# Patient Record
Sex: Male | Born: 1985 | Race: Black or African American | Hispanic: No | Marital: Married | Smoking: Current every day smoker
Health system: Southern US, Community
[De-identification: ages and names within clinical notes are randomized; demographics above are authoritative.]

## PROBLEM LIST (undated history)

## (undated) DIAGNOSIS — R42 Dizziness and giddiness: Secondary | ICD-10-CM

## (undated) HISTORY — PX: TONSILLECTOMY: SUR1361

## (undated) HISTORY — DX: Dizziness and giddiness: R42

---

## 2017-02-18 ENCOUNTER — Encounter: Payer: Self-pay | Admitting: Nurse Practitioner

## 2017-02-18 ENCOUNTER — Ambulatory Visit (INDEPENDENT_AMBULATORY_CARE_PROVIDER_SITE_OTHER): Payer: Medicaid Other | Admitting: Nurse Practitioner

## 2017-02-18 VITALS — BP 105/58 | HR 58 | Temp 98.2°F | Ht 69.5 in | Wt 145.0 lb

## 2017-02-18 DIAGNOSIS — R42 Dizziness and giddiness: Secondary | ICD-10-CM | POA: Diagnosis not present

## 2017-02-18 DIAGNOSIS — E860 Dehydration: Secondary | ICD-10-CM

## 2017-02-18 DIAGNOSIS — H43392 Other vitreous opacities, left eye: Secondary | ICD-10-CM

## 2017-02-18 LAB — CBC WITH DIFFERENTIAL/PLATELET
Basophils Absolute: 0 cells/uL (ref 0–200)
Basophils Relative: 0 %
Eosinophils Absolute: 102 cells/uL (ref 15–500)
Eosinophils Relative: 1 %
HCT: 44.2 % (ref 38.5–50.0)
Hemoglobin: 14.3 g/dL (ref 13.2–17.1)
Lymphocytes Relative: 19 %
Lymphs Abs: 1938 cells/uL (ref 850–3900)
MCH: 30.8 pg (ref 27.0–33.0)
MCHC: 32.4 g/dL (ref 32.0–36.0)
MCV: 95.3 fL (ref 80.0–100.0)
MPV: 10.3 fL (ref 7.5–12.5)
Monocytes Absolute: 306 cells/uL (ref 200–950)
Monocytes Relative: 3 %
Neutro Abs: 7854 cells/uL — ABNORMAL HIGH (ref 1500–7800)
Neutrophils Relative %: 77 %
Platelets: 235 10*3/uL (ref 140–400)
RBC: 4.64 MIL/uL (ref 4.20–5.80)
RDW: 13.4 % (ref 11.0–15.0)
WBC: 10.2 10*3/uL (ref 3.8–10.8)

## 2017-02-18 NOTE — Progress Notes (Signed)
Subjective:    Patient ID: Joshua Michaels., male    DOB: [birth year], 31 y.o.   MRN: [id number]  Joshua Shaffer. is a 31 y.o. male presenting on 02/18/2017 for Dizziness (intermittent dizziness, unstableness x 1 mth. He also complains of a spot in his left eye that moves when he changes the direction)   HPI Having dizziness w/ poor balance and has felt like falling over occurring the last several days intermittently throughout the day.  No dizziness now or earlier today.   Over last month he has had several episodes of 2-3 day periods w/ dizziness that would resolve.  Dizziness starts when up moving around, but is ok when seated.  Occasionally, but less often dizziness occurs when moving from sitting to standing.  Pt notes dizziness is sometimes worse when he smokes cigarettes, but doesn't happen every time he smokes.  Possibly dehydrated.  Has had periods where he ate and felt better, but food does not always help.  Eats one small meal per day, no snacks.  Sometimes may eat two small meals per day. This has been an eating pattern for last several years.  Occasionally will have days where eats all day and cannot get full, but only about 2-3 per month. No food yet today.  Rarely drinks fluids.  Drinks liquids rarely. Works 9 hrs/day 2nd shift.  Sleeps 2-3 hrs per day.  Tossing.  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 Would like to sleep 1a - 9-10am.  Left eye, floaters/flashers  Has a single black dot that moves with him, but has not gone away.  It appeared after flashes about 3 nights ago.  He experienced lots of white flashes like fireworks lasting seconds, but has not had any repeats.   History reviewed. No pertinent past medical history. Past Surgical History:  Procedure Laterality Date  . TONSILLECTOMY     Social History   Social History  . Marital status: Married    Spouse name: N/A  . Number of children: N/A  . Years of education: N/A   Occupational History  .  Not on file.   Social History Main Topics  . Smoking status: Current Every Day Smoker    Packs/day: 0.50    Years: 14.00    Types: Cigarettes  . Smokeless tobacco: Never Used  . Alcohol use No     Comment: rarely  . Drug use: No  . Sexual activity: Not on file   Other Topics Concern  . Not on file   Social History Narrative  . No narrative on file   Family History  Problem Relation Age of Onset  . Lung cancer Maternal Grandfather    No current outpatient prescriptions on file prior to visit.   No current facility-administered medications on file prior to visit.     Review of Systems  Constitutional: Negative.   HENT: Negative.   Eyes: Positive for visual disturbance.  Respiratory: Negative.   Cardiovascular: Negative.   Gastrointestinal: Negative.   Endocrine: Negative.   Genitourinary: Negative.   Musculoskeletal: Negative.   Skin: Negative.   Allergic/Immunologic: Negative.   Neurological: Positive for dizziness and light-headedness.  Hematological: Negative.   Psychiatric/Behavioral: Negative.    Per HPI unless specifically indicated above      Objective:    BP (!) 105/58 (BP Location: Right Arm, Patient Position: Sitting, Cuff Size: Normal)   Pulse (!) 58   Temp 98.2 F (36.8 C) (Oral)   Ht 5' 9.5" (1.765  m)   Wt 145 lb (65.8 kg)   SpO2 99%   BMI 21.11 kg/m   Wt Readings from Last 3 Encounters:  02/18/17 145 lb (65.8 kg)    Physical Exam  Constitutional: He is oriented to person, place, and time. He appears well-developed. No distress.  Thin appearing  HENT:  Head: Normocephalic and atraumatic.  Right Ear: Hearing, external ear and ear canal normal.  Left Ear: Hearing, tympanic membrane, external ear and ear canal normal.  Nose: Nose normal.  Mouth/Throat: Oropharynx is clear and moist.  Eyes: Conjunctivae and EOM are normal. Pupils are equal, round, and reactive to light.  Neck: Normal range of motion. Neck supple. No JVD present. No  tracheal deviation present. No thyromegaly present.  Cardiovascular: Normal rate, regular rhythm, normal heart sounds and intact distal pulses.   Pulmonary/Chest: Effort normal and breath sounds normal.  Musculoskeletal: Normal range of motion. He exhibits no edema.  Lymphadenopathy:    He has no cervical adenopathy.  Neurological: He is alert and oriented to person, place, and time. He has normal reflexes. No cranial nerve deficit. He exhibits normal muscle tone. Coordination normal.  Dix-hallpike positive for eliciting dizziness, negative nystagmus.  Skin: Skin is warm and dry.  Psychiatric: He has a normal mood and affect. His behavior is normal. Judgment and thought content normal.   Results for orders placed or performed in visit on 02/18/17  CBC with Differential/Platelet  Result Value Ref Range   WBC 10.2 3.8 - 10.8 K/uL   RBC 4.64 4.20 - 5.80 MIL/uL   Hemoglobin 14.3 13.2 - 17.1 g/dL   HCT 44.2 38.5 - 50.0 %   MCV 95.3 80.0 - 100.0 fL   MCH 30.8 27.0 - 33.0 pg   MCHC 32.4 32.0 - 36.0 g/dL   RDW 13.4 11.0 - 15.0 %   Platelets 235 140 - 400 K/uL   MPV 10.3 7.5 - 12.5 fL   Neutro Abs 7,854 (H) 1,500 - 7,800 cells/uL   Lymphs Abs 1,938 850 - 3,900 cells/uL   Monocytes Absolute 306 200 - 950 cells/uL   Eosinophils Absolute 102 15 - 500 cells/uL   Basophils Absolute 0 0 - 200 cells/uL   Neutrophils Relative % 77 %   Lymphocytes Relative 19 %   Monocytes Relative 3 %   Eosinophils Relative 1 %   Basophils Relative 0 %   Smear Review Criteria for review not met   Comprehensive metabolic panel  Result Value Ref Range   Sodium 139 135 - 146 mmol/L   Potassium 4.2 3.5 - 5.3 mmol/L   Chloride 106 98 - 110 mmol/L   CO2 25 20 - 31 mmol/L   Glucose, Bld 96 65 - 99 mg/dL   BUN 12 7 - 25 mg/dL   Creat 1.11 0.60 - 1.35 mg/dL   Total Bilirubin 0.3 0.2 - 1.2 mg/dL   Alkaline Phosphatase 99 40 - 115 U/L   AST 15 10 - 40 U/L   ALT 23 9 - 46 U/L   Total Protein 6.5 6.1 - 8.1 g/dL    Albumin 3.9 3.6 - 5.1 g/dL   Calcium 9.0 8.6 - 10.3 mg/dL      Assessment & Plan:   Problem List Items Addressed This Visit    None    Visit Diagnoses    Dizziness    -  Primary Dehydration, mild      Stable course.  Etiology if dizziness likely related to poor hydration and nutrition. Pt  w/ no active dizziness at start of visit.  Dizziness elicited w/ dix-hallpike maneuver.  Subsiding, then worsening after standing.  W/ pt history of poor nutrition and hydration, suspect dehydration related vertigo.  Plan: 1. CBC and CMP evaluate nutritional status, hydration. 2. Encouraged at least 2 meals per day.   3. Drink at least 2 L fluid per day.   4. Follow up 4 weeks or sooner if needed.   Relevant Orders   CBC with Differential/Platelet (Completed)   Comprehensive metabolic panel (Completed)   Floater, vitreous, left     Likely benign.  Possibly related to dehydration.  Discussed non-worsening nature w/ pt. Possible association to dehydration.  May be permanent.  Plan: 1. Encourage hydration. 2. Reassured.  Gave possiblity of resolution, but also stated may be permanent. 3. Consider optometry for second opinion.  Asked pt to monitor for worsening of black spots or floaters especially if they do not move within his field of vision.       No orders of the defined types were placed in this encounter.     Follow up plan: Return in about 4 weeks (around 03/18/2017) for dizziness, smoking cessation.  Cassell Smiles, [name], AGPCNP-BC Adult Gerontology Primary Care Nurse Practitioner [facility] 02/22/2017, 8:08 AM

## 2017-02-18 NOTE — Patient Instructions (Addendum)
Joshua Shaffer, Thank you for coming in to clinic today.  1. Drink 2 L of water each day or more up to 1 gallon.  2. Try to prepare better for sleep w/ a nighttime routine. - NO electronics 30 minutes before you want to sleep. - melatonin - 3-5 mg about 30 minutes before your want to sleep.  3. Eat more regularly. - small snacks or at least 3 small meals per day  Please schedule a follow-up appointment with [name] Meika Earll, [name] to Return in about 4 weeks (around 03/18/2017) for dizziness.  You can come sooner if needed.  If you have any other questions or concerns, please feel free to call the clinic or send a message through MyChart. You may also schedule an earlier appointment if necessary.  [name] Tamilyn Lupien, [name], [name]-BC Adult Gerontology Nurse Practitioner [facility], [facility]   Sleep Hygiene Tips  Take medicines only as directed by your health care provider.  Keep regular sleeping and waking hours. Avoid naps.  Keep a sleep diary to help you and your health care provider figure out what could be causing your insomnia. Include:  When you sleep.  When you wake up during the night.  How well you sleep.  How rested you feel the next day.  Any side effects of medicines you are taking.  What you eat and drink.  Make your bedroom a comfortable place where it is easy to fall asleep:  Put up shades or special blackout curtains to block light from outside.  Use a white noise machine to block noise.  Keep the temperature cool.  Exercise regularly as directed by your health care provider. Avoid exercising right before bedtime.  Use relaxation techniques to manage stress. Ask your health care provider to suggest some techniques that may work well for you. These may include:  Breathing exercises.  Routines to release muscle tension.  Visualizing peaceful scenes.  Cut back on alcohol, caffeinated beverages, and cigarettes, especially close to bedtime. These can  disrupt your sleep.  Do not overeat or eat spicy foods right before bedtime. This can lead to digestive discomfort that can make it hard for you to sleep.  Limit screen use before bedtime. This includes:  Watching TV.  Using your smartphone, tablet, and computer.  Stick to a routine. This can help you fall asleep faster. Try to do a quiet activity, brush your teeth, and go to bed at the same time each night.  Get out of bed if you are still awake after 15 minutes of trying to sleep. Keep the lights down, but try reading or doing a quiet activity. When you feel sleepy, go back to bed.  Make sure that you drive carefully. Avoid driving if you feel very sleepy.  Keep all follow-up appointments as directed by your health care provider. This is important.     Steps to Quit Smoking Smoking tobacco can be bad for your health. It can also affect almost every organ in your body. Smoking puts you and people around you at risk for many serious long-lasting (chronic) diseases. Quitting smoking is hard, but it is one of the best things that you can do for your health. It is never too late to quit. What are the benefits of quitting smoking? When you quit smoking, you lower your risk for getting serious diseases and conditions. They can include:  Lung cancer or lung disease.  Heart disease.  Stroke.  Heart attack.  Not being able to have children (  infertility).  Weak bones (osteoporosis) and broken bones (fractures).  If you have coughing, wheezing, and shortness of breath, those symptoms may get better when you quit. You may also get sick less often. If you are pregnant, quitting smoking can help to lower your chances of having a baby of low birth weight. What can I do to help me quit smoking? Talk with your doctor about what can help you quit smoking. Some things you can do (strategies) include:  Quitting smoking totally, instead of slowly cutting back how much you smoke over a period of  time.  Going to in-person counseling. You are more likely to quit if you go to many counseling sessions.  Using resources and support systems, such as: ? Agricultural engineer with a Veterinary surgeon. ? Phone quitlines. ? Automotive engineer. ? Support groups or group counseling. ? Text messaging programs. ? Mobile phone apps or applications.  Taking medicines. Some of these medicines may have nicotine in them. If you are pregnant or breastfeeding, do not take any medicines to quit smoking unless your doctor says it is okay. Talk with your doctor about counseling or other things that can help you.  Talk with your doctor about using more than one strategy at the same time, such as taking medicines while you are also going to in-person counseling. This can help make quitting easier. What things can I do to make it easier to quit? Quitting smoking might feel very hard at first, but there is a lot that you can do to make it easier. Take these steps:  Talk to your family and friends. Ask them to support and encourage you.  Call phone quitlines, reach out to support groups, or work with a Veterinary surgeon.  Ask people who smoke to not smoke around you.  Avoid places that make you want (trigger) to smoke, such as: ? Bars. ? Parties. ? Smoke-break areas at work.  Spend time with people who do not smoke.  Lower the stress in your life. Stress can make you want to smoke. Try these things to help your stress: ? Getting regular exercise. ? Deep-breathing exercises. ? Yoga. ? Meditating. ? Doing a body scan. To do this, close your eyes, focus on one area of your body at a time from head to toe, and notice which parts of your body are tense. Try to relax the muscles in those areas.  Download or buy apps on your mobile phone or tablet that can help you stick to your quit plan. There are many free apps, such as QuitGuide from the [employer] developer for Disease Control and Prevention). You can find more support from  smokefree.gov and other websites.  1-800 - Quit Now   This information is not intended to replace advice given to you by your health care provider. Make sure you discuss any questions you have with your health care provider. Document Released: 06/20/2009 Document Revised: 04/21/2016 Document Reviewed: 01/08/2015 Elsevier Interactive Patient Education  2018 [employer].

## 2017-02-19 LAB — COMPREHENSIVE METABOLIC PANEL
ALT: 23 U/L (ref 9–46)
AST: 15 U/L (ref 10–40)
Albumin: 3.9 g/dL (ref 3.6–5.1)
Alkaline Phosphatase: 99 U/L (ref 40–115)
BUN: 12 mg/dL (ref 7–25)
CO2: 25 mmol/L (ref 20–31)
Calcium: 9 mg/dL (ref 8.6–10.3)
Chloride: 106 mmol/L (ref 98–110)
Creat: 1.11 mg/dL (ref 0.60–1.35)
Glucose, Bld: 96 mg/dL (ref 65–99)
Potassium: 4.2 mmol/L (ref 3.5–5.3)
Sodium: 139 mmol/L (ref 135–146)
Total Bilirubin: 0.3 mg/dL (ref 0.2–1.2)
Total Protein: 6.5 g/dL (ref 6.1–8.1)

## 2017-02-22 ENCOUNTER — Encounter: Payer: Self-pay | Admitting: Nurse Practitioner

## 2017-02-22 NOTE — Progress Notes (Signed)
I have reviewed this encounter including the documentation in this note and/or discussed this patient with the provider, [name] Kennedy, AGPCNP-BC. I am certifying that I agree with the content of this note as supervising physician.  Saralyn PilarAlexander Itali Mckendry, DO [facility] 02/22/2017, 4:57 PM

## 2017-02-25 ENCOUNTER — Encounter: Payer: Self-pay | Admitting: Nurse Practitioner

## 2017-02-25 ENCOUNTER — Ambulatory Visit (INDEPENDENT_AMBULATORY_CARE_PROVIDER_SITE_OTHER): Payer: Medicaid Other | Admitting: Nurse Practitioner

## 2017-02-25 VITALS — BP 102/64 | HR 65 | Temp 97.7°F | Ht 69.5 in | Wt 140.6 lb

## 2017-02-25 DIAGNOSIS — F488 Other specified nonpsychotic mental disorders: Secondary | ICD-10-CM | POA: Diagnosis not present

## 2017-02-25 DIAGNOSIS — F418 Other specified anxiety disorders: Secondary | ICD-10-CM | POA: Diagnosis not present

## 2017-02-25 DIAGNOSIS — R42 Dizziness and giddiness: Secondary | ICD-10-CM | POA: Diagnosis not present

## 2017-02-25 MED ORDER — MECLIZINE HCL 25 MG PO TABS: 25.0000 mg | ORAL_TABLET | Freq: Three times a day (TID) | ORAL | 0 refills | Status: DC | PRN

## 2017-02-25 MED ORDER — CLONAZEPAM 0.5 MG PO TABS: 0.5000 mg | ORAL_TABLET | Freq: Two times a day (BID) | ORAL | 1 refills | Status: DC | PRN

## 2017-02-25 MED ORDER — ESCITALOPRAM OXALATE 10 MG PO TABS: 10.0000 mg | ORAL_TABLET | Freq: Every day | ORAL | 2 refills | Status: DC

## 2017-02-25 NOTE — Assessment & Plan Note (Signed)
Pt with recurrent symptoms of dizziness.  Possible association to vertigo w/ positive dix-hallpike at last visit.  Dix-hallpike negative today.  Also possible psychogenic symptom.  Plan: 1. Manage anxiety and depression. 2. START meclizine 25 mg tid prn.  Can take 1/2 tablet if drowsiness. 3. Follow up 1 month and as needed.

## 2017-02-25 NOTE — Assessment & Plan Note (Signed)
Uncontrolled.  Pt without prior diagnosis and symptoms x 8 months to > 1 year.  Affecting ability to hold a job and participate in activities w/ family.  Plan: 1. Initiate clonazepam 0.5 mg twice daily as needed for anxiety/panic attack.  Short term usage until SSRI at goal dose. 2. START escitalopram 10 mg once daily. 3. Reviewed SI/HI and plan to carry out.  Cautioned that on medications he may have energy to carry out SI and to notify wife and seek care at ED immediately. 4. Call and schedule psychology counseling.  Provided self-referral resources. 5. Follow up 4 weeks or sooner if needed.

## 2017-02-25 NOTE — Patient Instructions (Addendum)
Joshua Shaffer, Thank you for coming in to clinic today.  1. For your dizziness: - take meclizine one tablet as needed up to three times daily. - If this makes you sleepy, take 1/2 tablet.  2. For your anxiety/ panic attacks: - START clonazepam 0.5 mg as needed up to two times daily. - As we get your dose of escitalopram stable, you will probably need this less frequently.   3. For your anxiety and depression: - START taking escitalopram 10 mg once daily. - If you have nausea, take 1/2 tablet for 1 week. Can also take with food. - It will take 4-6 weeks for this medication to be at full effect.  - START going to a psychologist for counseling only. Psych Counseling ONLY Self Referral: 1. [facility] [place] [facility]   Address: [place], [place], [place] [address] Hours: Open today  9AM-7PM Phone: [phone]  2. [place], [place] Address: [address] [place], [place], [place] [address] Phone: [phone]   [place]   Address: [place], [place], [place] [address] Hours: Tues 4:15PM-8PM // Weds 9am - 12pm // [place] (Closed other days) Phone: [phone]  COUNSELING and psychiatry Self Referral [facility]) [place] [address] [place], [place] [address] Phone: [phone]  [employer], available walk-in 9am-4pm M-F [place],  [id number] Hours: Lup[place] (M-F, walk in available) Phone:(336) [place] (All ages) [address], [place], [id number] Phone: [phone] (Option 1) [email or web address]  Please schedule a follow-up appointment with Cassell Smiles, [name] to Return in about 4 weeks (around 03/25/2017) for anxiety and depression or sooner if needed.  If you have any other questions or concerns, please feel free to call the  clinic or send a message through [place]. You may also schedule an earlier appointment if necessary.  Cassell Smiles, [name], [name]-BC Adult Gerontology Nurse Practitioner [facility], [facility]    Stress and Stress Management Stress is a normal reaction to life events. It is what you feel when life demands more than you are used to or more than you can handle. Some stress can be useful. For example, the stress reaction can help you catch the last bus of the day, study for a test, or meet a deadline at work. But stress that occurs too often or for too long can cause problems. It can affect your emotional health and interfere with relationships and normal daily activities. Too much stress can weaken your immune system and increase your risk for physical illness. If you already have a medical problem, stress can make it worse. What are the causes? All sorts of life events may cause stress. An event that causes stress for one person may not be stressful for another person. Major life events commonly cause stress. These may be positive or negative. Examples include losing your job, moving into a new home, getting married, having a baby, or losing a loved one. Less obvious life events may also cause stress, especially if they occur day after day or in combination. Examples include working long hours, driving in traffic, caring for children, being in debt, or being in a difficult relationship. What are the signs or symptoms? Stress may cause emotional symptoms including, the following:  Anxiety. This is feeling worried, afraid, on edge, overwhelmed, or out  of control.  Anger. This is feeling irritated or impatient.  Depression. This is feeling sad, down, helpless, or guilty.  Difficulty focusing, remembering, or making decisions.  Stress may cause physical symptoms, including the following:  Aches and pains. These may affect your head, neck, back, stomach, or other areas of your  body.  Tight muscles or clenched jaw.  Low energy or trouble sleeping.  Stress may cause unhealthy behaviors, including the following:  Eating to feel better (overeating) or skipping meals.  Sleeping too little, too much, or both.  Working too much or putting off tasks (procrastination).  Smoking, drinking alcohol, or using drugs to feel better.  How is this diagnosed? Stress is diagnosed through an assessment by your health care provider. Your health care provider will ask questions about your symptoms and any stressful life events.Your health care provider will also ask about your medical history and may order blood tests or other tests. Certain medical conditions and medicine can cause physical symptoms similar to stress. Mental illness can cause emotional symptoms and unhealthy behaviors similar to stress. Your health care provider may refer you to a mental health professional for further evaluation. How is this treated? Stress management is the recommended treatment for stress.The goals of stress management are reducing stressful life events and coping with stress in healthy ways. Techniques for reducing stressful life events include the following:  Stress identification. Self-monitor for stress and identify what causes stress for you. These skills may help you to avoid some stressful events.  Time management. Set your priorities, keep a calendar of events, and learn to say "no." These tools can help you avoid making too many commitments.  Techniques for coping with stress include the following:  Rethinking the problem. Try to think realistically about stressful events rather than ignoring them or overreacting. Try to find the positives in a stressful situation rather than focusing on the negatives.  Exercise. Physical exercise can release both physical and emotional tension. The key is to find a form of exercise you enjoy and do it regularly.  Relaxation techniques. These  relax the body and mind. Examples include yoga, meditation, tai chi, biofeedback, deep breathing, progressive muscle relaxation, listening to music, being out in nature, journaling, and other hobbies. Again, the key is to find one or more that you enjoy and can do regularly.  Healthy lifestyle. Eat a balanced diet, get plenty of sleep, and do not smoke. Avoid using alcohol or drugs to relax.  Strong support network. Spend time with family, friends, or other people you enjoy being around.Express your feelings and talk things over with someone you trust.  Counseling or talktherapy with a mental health professional may be helpful if you are having difficulty managing stress on your own. Medicine is typically not recommended for the treatment of stress.Talk to your health care provider if you think you need medicine for symptoms of stress. Follow these instructions at home:  Keep all follow-up visits as directed by your health care provider.  Take all medicines as directed by your health care provider. Contact a health care provider if:  Your symptoms get worse or you start having new symptoms.  You feel overwhelmed by your problems and can no longer manage them on your own. Get help right away if:  You feel like hurting yourself or someone else. This information is not intended to replace advice given to you by your health care provider. Make sure you discuss any questions you have with your health care  provider. Document Released: 02/17/2001 Document Revised: 01/30/2016 Document Reviewed: 04/18/2013 Elsevier Interactive Patient Education  2017 [employer].

## 2017-02-25 NOTE — Progress Notes (Signed)
Subjective:    Patient ID: Joshua Shaffer., male    DOB: [birth year], 31 y.o.   MRN: [id number]  Joshua Shaffer. is a 31 y.o. male presenting on 02/25/2017 for Dizziness (pt have not been able to work )  Pt's wife accompanies him to the appointment today.  HPI Dizziness/Floaters Pt notes resolution of floaters.  He also notes ongoing intermittent dizziness worst w/ smoking.  He has increased water intake, but has not increased food intake r/t poor appetite.  Syncope Has lost his job r/t passing out since our last visit during his first day on the job.  Anxiety and Depression Anhedonia x months to 1 year.  Has trouble sleeping months to 1 year.  Pan-sleep cycle disruption.  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  Decreased appetite x months.  Increased appetite 2-3 days per month.  Possible cause: financially, housing, Not providing for family.  Pt unable to describe what being an adequate provider would look like at this time.  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  Recent symptoms at new job on the first day are: felt hot, dizzy, "overwhelmed," and nervous.  He has periods of time where he also "cant move," has shortness of breath, and occasional tunnel vision.  Sometimes can't drive.  Description of these panic attack episodes occur normally 1-2 times per day up to 3-4 times. Pt's wife admits these symptoms "skyrocketed" 1 month ago b/c moved into "bad neighborhood" and has increased to daily.  Prior, was not daily occurrence.  These anxieties, panic attacks are a affecting pt's ability to hold job.  Endorses SI. Denies plan.  No current thoughts of SI, but last occurred yesterday.  Depression screen PHQ 2/9 02/25/2017  Decreased Interest 3  Down, Depressed, Hopeless 3  PHQ - 2 Score 6  Altered sleeping 1  Tired, decreased energy 2  Change in appetite 3    Feeling bad or failure about yourself  3  Trouble concentrating 2  Moving slowly or fidgety/restless 2  Suicidal thoughts 2  PHQ-9 Score 21   GAD 7 : Generalized Anxiety Score 02/25/2017  Nervous, Anxious, on Edge 3  Control/stop worrying 3  Worry too much - different things 3  Trouble relaxing 2  Restless 2  Easily annoyed or irritable 3  Afraid - awful might happen 3  Total GAD 7 Score 19  Anxiety Difficulty Extremely difficult    [place]-Suicide Severity Rating Scale 1) Have you wished you were dead or wished you could go to sleep and not wake up? - Yes  2) Have you had any actual thoughts of killing yourself? - Yes  3) Have you been thinking about how you might do this? - No  4) Have you had these thoughts and had some intention of acting on them? - No  5) have you started to work out or worked out the details of how to kill yourself? Do you intend to carry out this plan? - No  6) Have you ever done anything, started to do anything, or prepared to do anything to end your life? - No   Social History  Substance Use Topics  . Smoking status: Current Every Day Smoker    Packs/day: 0.50    Years: 14.00    Types: Cigarettes  . Smokeless tobacco: Never Used  . Alcohol use No     Comment:  rarely    Review of Systems Per HPI unless specifically indicated above     Objective:    BP 102/64 (BP Location: Left Arm, Patient Position: Sitting, Cuff Size: Normal)   Pulse 65   Temp 97.7 F (36.5 C) (Oral)   Ht 5' 9.5" (1.765 m)   Wt 140 lb 9.6 oz (63.8 kg)   BMI 20.47 kg/m   Wt Readings from Last 3 Encounters:  02/25/17 140 lb 9.6 oz (63.8 kg)  02/18/17 145 lb (65.8 kg)    Physical Exam  General - healthy, well-appearing, NAD HEENT - Normocephalic, atraumatic, PERRL, EOMI, patent nares w/o congestion, oropharynx clear, MMM Neck - supple, non-tender, no LAD, no thyromegaly Heart - RRR, bradycardia, no murmurs heard Lungs - Clear throughout all lobes, no wheezing,  crackles, or rhonchi. Normal work of breathing. Abdomen - soft, NTND, no masses, no hepatosplenomegaly, active bowel sounds Extremeties - non-tender, no edema, cap refill < 2 seconds, peripheral pulses intact +2 bilaterally Skin - warm, dry, no rashes Neuro - awake, alert, oriented x 3, CN II-X intact, negative dix-hallpike maneuver Psych - Depressed mood and affect, withdrawn behavior, tearful at times during visit.  Normal thought content and judgment.  SI in past week.  No SI/HI today.  No plan for carrying out SI/HI.  Results for orders placed or performed in visit on 02/18/17  CBC with Differential/Platelet  Result Value Ref Range   WBC 10.2 3.8 - 10.8 K/uL   RBC 4.64 4.20 - 5.80 MIL/uL   Hemoglobin 14.3 13.2 - 17.1 g/dL   HCT 44.2 38.5 - 50.0 %   MCV 95.3 80.0 - 100.0 fL   MCH 30.8 27.0 - 33.0 pg   MCHC 32.4 32.0 - 36.0 g/dL   RDW 13.4 11.0 - 15.0 %   Platelets 235 140 - 400 K/uL   MPV 10.3 7.5 - 12.5 fL   Neutro Abs 7,854 (H) 1,500 - 7,800 cells/uL   Lymphs Abs 1,938 850 - 3,900 cells/uL   Monocytes Absolute 306 200 - 950 cells/uL   Eosinophils Absolute 102 15 - 500 cells/uL   Basophils Absolute 0 0 - 200 cells/uL   Neutrophils Relative % 77 %   Lymphocytes Relative 19 %   Monocytes Relative 3 %   Eosinophils Relative 1 %   Basophils Relative 0 %   Smear Review Criteria for review not met   Comprehensive metabolic panel  Result Value Ref Range   Sodium 139 135 - 146 mmol/L   Potassium 4.2 3.5 - 5.3 mmol/L   Chloride 106 98 - 110 mmol/L   CO2 25 20 - 31 mmol/L   Glucose, Bld 96 65 - 99 mg/dL   BUN 12 7 - 25 mg/dL   Creat 1.11 0.60 - 1.35 mg/dL   Total Bilirubin 0.3 0.2 - 1.2 mg/dL   Alkaline Phosphatase 99 40 - 115 U/L   AST 15 10 - 40 U/L   ALT 23 9 - 46 U/L   Total Protein 6.5 6.1 - 8.1 g/dL   Albumin 3.9 3.6 - 5.1 g/dL   Calcium 9.0 8.6 - 10.3 mg/dL      Assessment & Plan:   Problem List Items Addressed This Visit      Other   Anxiety with depression -  Primary    Uncontrolled.  Pt without prior diagnosis and symptoms x 8 months to > 1 year.  Affecting ability to hold a job and participate in activities w/ family.  Plan: 1. Initiate  clonazepam 0.5 mg twice daily as needed for anxiety/panic attack.  Short term usage until SSRI at goal dose. 2. START escitalopram 10 mg once daily. 3. Reviewed SI/HI and plan to carry out.  Cautioned that on medications he may have energy to carry out SI and to notify wife and seek care at ED immediately and sooner f/u w/ me. 4. Call and schedule psychology counseling.  Provided self-referral resources. 5. Follow up 4 weeks or sooner if needed.      Relevant Medications   clonazePAM (KLONOPIN) 0.5 MG tablet   Dizziness    Pt with recurrent symptoms of dizziness.  Possible association to vertigo w/ positive dix-hallpike at last visit.  Dix-hallpike negative today.  Also possible psychogenic symptom.  Plan: 1. Manage anxiety and depression. 2. START meclizine 25 mg tid prn.  Can take 1/2 tablet if drowsiness. 3. Follow up 1 month and as needed.      Relevant Medications   meclizine (ANTIVERT) 25 MG tablet    Other Visit Diagnoses    Psychogenic syncope     Pt w/ syncopal episode at work w/ new job and high anxiety.    Plan: 1. Treat depression as above. 2. Follow up w/ symptoms in 4 weeks.      Meds ordered this encounter  Medications  . meclizine (ANTIVERT) 25 MG tablet    Sig: Take 1 tablet (25 mg total) by mouth 3 (three) times daily as needed for dizziness.    Dispense:  45 tablet    Refill:  0  . escitalopram (LEXAPRO) 10 MG tablet    Sig: Take 1 tablet (10 mg total) by mouth daily.    Dispense:  30 tablet    Refill:  2  . clonazePAM (KLONOPIN) 0.5 MG tablet    Sig: Take 1 tablet (0.5 mg total) by mouth 2 (two) times daily as needed for anxiety.    Dispense:  60 tablet    Refill:  1      Follow up plan: Return in about 4 weeks (around 03/25/2017) for anxiety and depression or  sooner if needed.  Cassell Smiles, [name], AGPCNP-BC Adult Gerontology Primary Care Nurse Practitioner [facility] 02/25/2017, 1:33 PM

## 2017-03-03 NOTE — Progress Notes (Signed)
I have reviewed this encounter including the documentation in this note and/or discussed this patient with the provider, [name] Kennedy, AGPCNP-BC. I am certifying that I agree with the content of this note as supervising physician.  Saralyn PilarAlexander Karamalegos, DO [facility] 03/03/2017, 12:19 PM

## 2017-03-17 ENCOUNTER — Ambulatory Visit (INDEPENDENT_AMBULATORY_CARE_PROVIDER_SITE_OTHER): Payer: Medicaid Other | Admitting: Nurse Practitioner

## 2017-03-17 ENCOUNTER — Encounter: Payer: Self-pay | Admitting: Nurse Practitioner

## 2017-03-17 VITALS — BP 117/72 | HR 49 | Ht 69.0 in | Wt 139.0 lb

## 2017-03-17 DIAGNOSIS — F418 Other specified anxiety disorders: Secondary | ICD-10-CM

## 2017-03-17 DIAGNOSIS — R42 Dizziness and giddiness: Secondary | ICD-10-CM

## 2017-03-17 MED ORDER — BUSPIRONE HCL 5 MG PO TABS: 5.0000 mg | ORAL_TABLET | Freq: Three times a day (TID) | ORAL | 1 refills | Status: DC

## 2017-03-17 MED ORDER — ESCITALOPRAM OXALATE 20 MG PO TABS: 20.0000 mg | ORAL_TABLET | Freq: Every day | ORAL | 2 refills | Status: DC

## 2017-03-17 NOTE — Patient Instructions (Addendum)
Joshua Shaffer, Thank you for coming in to clinic today.  1. For your anxiety and depression: - START buspirone 5 mg once daily at breakfast for 2 days, then increase after 2 days to breakfast and dinner,  Finally increase to breakfast, dinner and bedtime.  - Remember your body scan for mindfulness.    2. Call [facility] today for an appointment in the next 7 days. [facility]) [place] [address] [place], [place] [address] Phone: [phone]  Please schedule a follow-up appointment with [name], [name] to Return in about 1 week (around 03/24/2017) for and 4 weeks anxiety and depression.  If you have been seen [facility] by next Wednesday, you can cancel our 1 week appointment.  If you have any other questions or concerns, please feel free to call the clinic or send a message through MyChart. You may also schedule an earlier appointment if necessary.  [name], [name], [name]-BC Adult Gerontology Nurse Practitioner [facility]    Generalized Anxiety Disorder, Adult Generalized anxiety disorder (GAD) is a mental health disorder. People with this condition constantly worry about everyday events. Unlike normal anxiety, worry related to GAD is not triggered by a specific event. These worries also do not fade or get better with time. GAD interferes with life functions, including relationships, work, and school. GAD can vary from mild to severe. People with severe GAD can have intense waves of anxiety with physical symptoms (panic attacks). What are the causes? The exact cause of GAD is not known. What increases the risk? This condition is more likely to develop in:  Women.  People who have a family history of anxiety disorders.  People who are very shy.  People who experience very stressful life events, such as the death of a loved one.  People who have a very stressful family environment.  What are the signs or symptoms? People with GAD often worry  excessively about many things in their lives, such as their health and family. They may also be overly concerned about:  Doing well at work.  Being on time.  Natural disasters.  Friendships.  Physical symptoms of GAD include:  Fatigue.  Muscle tension or having muscle twitches.  Trembling or feeling shaky.  Being easily startled.  Feeling like your heart is pounding or racing.  Feeling out of breath or like you cannot take a deep breath.  Having trouble falling asleep or staying asleep.  Sweating.  Nausea, diarrhea, or irritable bowel syndrome (IBS).  Headaches.  Trouble concentrating or remembering facts.  Restlessness.  Irritability.  How is this diagnosed? Your health care provider can diagnose GAD based on your symptoms and medical history. You will also have a physical exam. The health care provider will ask specific questions about your symptoms, including how severe they are, when they started, and if they come and go. Your health care provider may ask you about your use of alcohol or drugs, including prescription medicines. Your health care provider may refer you to a mental health specialist for further evaluation. Your health care provider will do a thorough examination and may perform additional tests to rule out other possible causes of your symptoms. To be diagnosed with GAD, a person must have anxiety that:  Is out of his or her control.  Affects several different aspects of his or her life, such as work and relationships.  Causes distress that makes him or her unable to take part in normal activities.  Includes at least  three physical symptoms of GAD, such as restlessness, fatigue, trouble concentrating, irritability, muscle tension, or sleep problems.  Before your health care provider can confirm a diagnosis of GAD, these symptoms must be present more days than they are not, and they must last for six months or longer. How is this treated? The  following therapies are usually used to treat GAD:  Medicine. Antidepressant medicine is usually prescribed for long-term daily control. Antianxiety medicines may be added in severe cases, especially when panic attacks occur.  Talk therapy (psychotherapy). Certain types of talk therapy can be helpful in treating GAD by providing support, education, and guidance. Options include: ? Cognitive behavioral therapy (CBT). People learn coping skills and techniques to ease their anxiety. They learn to identify unrealistic or negative thoughts and behaviors and to replace them with positive ones. ? Acceptance and commitment therapy (ACT). This treatment teaches people how to be mindful as a way to cope with unwanted thoughts and feelings. ? Biofeedback. This process trains you to manage your body's response (physiological response) through breathing techniques and relaxation methods. You will work with a therapist while machines are used to monitor your physical symptoms.  Stress management techniques. These include yoga, meditation, and exercise.  A mental health specialist can help determine which treatment is best for you. Some people see improvement with one type of therapy. However, other people require a combination of therapies. Follow these instructions at home:  Take over-the-counter and prescription medicines only as told by your health care provider.  Try to maintain a normal routine.  Try to anticipate stressful situations and allow extra time to manage them.  Practice any stress management or self-calming techniques as taught by your health care provider.  Do not punish yourself for setbacks or for not making progress.  Try to recognize your accomplishments, even if they are small.  Keep all follow-up visits as told by your health care provider. This is important. Contact a health care provider if:  Your symptoms do not get better.  Your symptoms get worse.  You have signs of  depression, such as: ? A persistently sad, cranky, or irritable mood. ? Loss of enjoyment in activities that used to bring you joy. ? Change in weight or eating. ? Changes in sleeping habits. ? Avoiding friends or family members. ? Loss of energy for normal tasks. ? Feelings of guilt or worthlessness. Get help right away if:  You have serious thoughts about hurting yourself or others. If you ever feel like you may hurt yourself or others, or have thoughts about taking your own life, get help right away. You can go to your nearest emergency department or call:  Your local emergency services (911 in the [place]  A suicide crisis helpline, such as the National Suicide Prevention Lifeline at 601-391-02551-(360)574-7695. This is open 24 hours a day.  Summary  Generalized anxiety disorder (GAD) is a mental health disorder that involves worry that is not triggered by a specific event.  People with GAD often worry excessively about many things in their lives, such as their health and family.  GAD may cause physical symptoms such as restlessness, trouble concentrating, sleep problems, frequent sweating, nausea, diarrhea, headaches, and trembling or muscle twitching.  A mental health specialist can help determine which treatment is best for you. Some people see improvement with one type of therapy. However, other people require a combination of therapies. This information is not intended to replace advice given to you by your health care provider.  Make sure you discuss any questions you have with your health care provider. Document Released: 12/19/2012 Document Revised: 07/14/2016 Document Reviewed: 07/14/2016 Elsevier Interactive Patient Education  [employer].

## 2017-03-17 NOTE — Progress Notes (Signed)
Subjective:    Patient ID: Joshua Michaels., male    DOB: [birth year], 31 y.o.   MRN: [id number]  Joshua Schnitzler. is a 31 y.o. male presenting on 03/17/2017 for Anxiety (pt complains of dizziness, he states he's been of the medication x 3-4 days and symptoms have worsen.)  Pt's wife accompanies him to visit today.  Is good source of information, but her presence is making Adrik withdraw.  Wife asked to leave and willingly complies.  HPI  Anxiety Pt had started taking meds as prescribed and started feeling better after about 1-2 weeks.  Then he stopped escitalopram regularly and taking more clonazepam than prescribed. He ran out of clonazepam then began taking too many excitalopram tablets and using alcohol for self-medication.  W/O being on clonazepam has felt dizzy, frustration, agitation.  He notes feeling severe anxiety/depression symptoms. - denies shakiness  - Has had muscle spasms. - dizziness: Meclizine not helping dizziness- only making him sleepy. -No appetite, no sleep x 2-3 days at a time.  Is able to lay down but "brain won't shut off."  When in a "calm, normal environment I'm fine."   - Has panic attack when in a store - feels more stress in a store. - Also has panic attacks at home while w/ kids and without.   Pt notes a desperation to feel better. Admits SI w/o plan 2-3 times per week, but currently denies SI/HI. Has not called psychiatry as asked at last visit.   Social History  Substance Use Topics  . Smoking status: Current Every Day Smoker    Packs/day: 0.50    Years: 14.00    Types: Cigarettes  . Smokeless tobacco: Never Used  . Alcohol use No     Comment: rarely    Review of Systems Per HPI unless specifically indicated above     Objective:    BP 117/72 (BP Location: Right Arm, Patient Position: Sitting, Cuff Size: Normal)   Pulse (!) 49   Ht 5' 9" (1.753 m)   Wt 139 lb (63 kg)   BMI 20.53 kg/m   Wt Readings from Last 3 Encounters:    03/19/17 139 lb (63 kg)  03/17/17 139 lb (63 kg)  02/25/17 140 lb 9.6 oz (63.8 kg)    Physical Exam  General - thin appearing, well-appearing HEENT - Normocephalic, atraumatic Heart - RRR, no murmurs heard Lungs - Clear throughout all lobes, no wheezing, crackles, or rhonchi. Normal work of breathing. Extremeties - non-tender, no edema, cap refill < 2 seconds, peripheral pulses intact +2 bilaterally Skin - warm, dry, no rashes Neuro - awake, alert, oriented x3, no tremor noted Psych - Anxious, irritable mood and withdrawn affect, self-soothing behavior w/ head in hands, elbows on knees, rocking in chair.  Pt verbally aggressive toward wife when she contributes information about his behavioral habits and medication administration history.  W/ wife absent less withdrawn.  Results for orders placed or performed in visit on 02/18/17  CBC with Differential/Platelet  Result Value Ref Range   WBC 10.2 3.8 - 10.8 K/uL   RBC 4.64 4.20 - 5.80 MIL/uL   Hemoglobin 14.3 13.2 - 17.1 g/dL   HCT 44.2 38.5 - 50.0 %   MCV 95.3 80.0 - 100.0 fL   MCH 30.8 27.0 - 33.0 pg   MCHC 32.4 32.0 - 36.0 g/dL   RDW 13.4 11.0 - 15.0 %   Platelets 235 140 - 400 K/uL   MPV 10.3 7.5 -  12.5 fL   Neutro Abs 7,854 (H) 1,500 - 7,800 cells/uL   Lymphs Abs 1,938 850 - 3,900 cells/uL   Monocytes Absolute 306 200 - 950 cells/uL   Eosinophils Absolute 102 15 - 500 cells/uL   Basophils Absolute 0 0 - 200 cells/uL   Neutrophils Relative % 77 %   Lymphocytes Relative 19 %   Monocytes Relative 3 %   Eosinophils Relative 1 %   Basophils Relative 0 %   Smear Review Criteria for review not met   Comprehensive metabolic panel  Result Value Ref Range   Sodium 139 135 - 146 mmol/L   Potassium 4.2 3.5 - 5.3 mmol/L   Chloride 106 98 - 110 mmol/L   CO2 25 20 - 31 mmol/L   Glucose, Bld 96 65 - 99 mg/dL   BUN 12 7 - 25 mg/dL   Creat 1.11 0.60 - 1.35 mg/dL   Total Bilirubin 0.3 0.2 - 1.2 mg/dL   Alkaline Phosphatase 99 40 -  115 U/L   AST 15 10 - 40 U/L   ALT 23 9 - 46 U/L   Total Protein 6.5 6.1 - 8.1 g/dL   Albumin 3.9 3.6 - 5.1 g/dL   Calcium 9.0 8.6 - 10.3 mg/dL      Assessment & Plan:   Problem List Items Addressed This Visit      Other   Anxiety with depression - Primary    Not coping well with anxiety.  Not taking medications as prescribed and is using alcohol for self-medication.  He has not been able to begin applying for jobs.  Pt has violated our controlled substances contract by taking med more frequently than prescribed and will not be prescribed any more clonazepam.  Plan: 1. Continue escitalopram, but increase to 20 mg once daily since initially responded to med. 2. START buspirone 5 mg once daily, then increase by once dose weekly until at tid dosing.   3. Reviewed serotonin syndrome symptoms w/ pt. Call if experiences any tremor, headache, sweating, diarrhea, increased agitation. 4. Avoid all alcohol. 5. Provided information for patient to call [facility] for scheduling a psychiatry visit and counseling.  Discussed possibility of brief inpatient behavioral health treatment, but pt refuses at this time. No indication for involuntary commitment. 6. Discussed mindfulness strategies of deep breathing and body scan w/ addressing thoughts one by one as initial CBT techniques for panic attacks.  Provided single 2 minute body scan practice session. 7. Follow up w/ phone call this week and in office in one week.      Dizziness    Pt with recurrent symptoms of dizziness.  Dix-hallpike negative today w/ lack of response to meclizine.  Likely psychogenic symptom.  Plan: 1. Manage anxiety and depression. 2. STOP meclizine 25 mg tid prn.  3. Follow up 1 week.         Meds ordered this encounter  Medications  . escitalopram (LEXAPRO) 20 MG tablet    Sig: Take 1 tablet (20 mg total) by mouth daily.    Dispense:  30 tablet    Refill:  2    Order Specific Question:   Supervising Provider    Answer:    Olin Hauser [[id number]]  . busPIRone (BUSPAR) 5 MG tablet    Sig: Take 1 tablet (5 mg total) by mouth 3 (three) times daily.    Dispense:  90 tablet    Refill:  1    Order Specific Question:   Supervising Provider  Answer:   Olin Hauser [[id number]]      Follow up plan: Return in about 1 week (around 03/24/2017) for and 4 weeks anxiety and depression.  A total of 40 minutes was spent face-to-face with this patient. Greater than 50% of this time was spent in counseling and coordination of care with the patient regarding anxiety, stress management techniques, medications, and next treatment options.  Cassell Smiles, [name], AGPCNP-BC Adult Gerontology Primary Care Nurse Practitioner [facility] 03/20/2017, 5:14 PM

## 2017-03-19 ENCOUNTER — Encounter: Payer: Self-pay | Admitting: Emergency Medicine

## 2017-03-19 ENCOUNTER — Emergency Department
Admission: EM | Admit: 2017-03-19 | Discharge: 2017-03-19 | Disposition: A | Discharge status: Home / Self Care | Payer: Medicaid Other | Attending: Emergency Medicine | Admitting: Emergency Medicine

## 2017-03-19 DIAGNOSIS — R42 Dizziness and giddiness: Secondary | ICD-10-CM

## 2017-03-19 DIAGNOSIS — F17210 Nicotine dependence, cigarettes, uncomplicated: Secondary | ICD-10-CM | POA: Diagnosis not present

## 2017-03-19 DIAGNOSIS — F419 Anxiety disorder, unspecified: Secondary | ICD-10-CM

## 2017-03-19 DIAGNOSIS — R109 Unspecified abdominal pain: Secondary | ICD-10-CM | POA: Diagnosis present

## 2017-03-19 LAB — CBC
HEMATOCRIT: 47.4 % (ref 40.0–52.0)
HEMOGLOBIN: 16.2 g/dL (ref 13.0–18.0)
MCH: 30.9 pg (ref 26.0–34.0)
MCHC: 34.2 g/dL (ref 32.0–36.0)
MCV: 90.5 fL (ref 80.0–100.0)
Platelets: 258 10*3/uL (ref 150–440)
RBC: 5.24 MIL/uL (ref 4.40–5.90)
RDW: 13.3 % (ref 11.5–14.5)
WBC: 12.5 10*3/uL — ABNORMAL HIGH (ref 3.8–10.6)

## 2017-03-19 LAB — LIPASE, BLOOD: LIPASE: 27 U/L (ref 11–51)

## 2017-03-19 LAB — COMPREHENSIVE METABOLIC PANEL
ALT: 28 U/L (ref 17–63)
ANION GAP: 7 (ref 5–15)
AST: 24 U/L (ref 15–41)
Albumin: 4.7 g/dL (ref 3.5–5.0)
Alkaline Phosphatase: 89 U/L (ref 38–126)
BUN: 7 mg/dL (ref 6–20)
CHLORIDE: 103 mmol/L (ref 101–111)
CO2: 25 mmol/L (ref 22–32)
Calcium: 9.4 mg/dL (ref 8.9–10.3)
Creatinine, Ser: 1.15 mg/dL (ref 0.61–1.24)
Glucose, Bld: 108 mg/dL — ABNORMAL HIGH (ref 65–99)
POTASSIUM: 3.8 mmol/L (ref 3.5–5.1)
Sodium: 135 mmol/L (ref 135–145)
TOTAL PROTEIN: 8.4 g/dL — AB (ref 6.5–8.1)
Total Bilirubin: 0.6 mg/dL (ref 0.3–1.2)

## 2017-03-19 LAB — URINALYSIS, COMPLETE (UACMP) WITH MICROSCOPIC
BACTERIA UA: NONE SEEN
BILIRUBIN URINE: NEGATIVE
Glucose, UA: NEGATIVE mg/dL
HGB URINE DIPSTICK: NEGATIVE
KETONES UR: NEGATIVE mg/dL
LEUKOCYTES UA: NEGATIVE
NITRITE: NEGATIVE
Protein, ur: NEGATIVE mg/dL
RBC / HPF: NONE SEEN RBC/hpf (ref 0–5)
SQUAMOUS EPITHELIAL / LPF: NONE SEEN
Specific Gravity, Urine: 1.016 (ref 1.005–1.030)
pH: 5 (ref 5.0–8.0)

## 2017-03-19 NOTE — ED Triage Notes (Signed)
Pt with abd pain for over a week.

## 2017-03-19 NOTE — ED Provider Notes (Signed)
[facility] Emergency Department Provider Note  ____________________________________________  Time seen: Approximately 8:00 PM  I have reviewed the triage vital signs and the nursing notes.   HISTORY  Chief Complaint Abdominal Pain   HPI Joshua Shaffer. is a 31 y.o. male with h/o anxiety and panic attacks who presents for evaluation of anxiety.Patient reports that he was prescribed escitalopram by his PCP. He was given one month supply which he took in three weeks. He received a refill from his PCP and has been taking it as prescribed but has been drinking alcohol with it. Patient concerned that he may be withdrawing or have organ failure from taking too much medication and drinking. He denies SI or HI. Triage note says patient has abdominal pain but patient tells me no abdominal pain he just wanted to make sure he did not sustain any organ failure. He has been feeling lightheaded especially after taking escitalopram with alcohol. No lightheadedness at this time. No tremors, no hallucinations, no hypertension, no vomiting or diarrhea. Patient has no chest pain or shortness of breath, no fever or chills, no nausea.  Past Medical History:  Diagnosis Date  . Dizziness     Patient Active Problem List   Diagnosis Date Noted  . Anxiety with depression 02/25/2017  . Dizziness 02/25/2017    Past Surgical History:  Procedure Laterality Date  . TONSILLECTOMY      Prior to Admission medications   Medication Sig Start Date End Date Taking? Authorizing Provider  busPIRone (BUSPAR) 5 MG tablet Take 1 tablet (5 mg total) by mouth 3 (three) times daily. 03/17/17   Galen Manila, NP  escitalopram (LEXAPRO) 20 MG tablet Take 1 tablet (20 mg total) by mouth daily. 03/17/17   Galen Manila, NP    Allergies Patient has no known allergies.  Family History  Problem Relation Age of Onset  . Lung cancer Maternal Grandfather     Social History Social  History  Substance Use Topics  . Smoking status: Current Every Day Smoker    Packs/day: 0.50    Years: 14.00    Types: Cigarettes  . Smokeless tobacco: Never Used  . Alcohol use No     Comment: rarely    Review of Systems  Constitutional: Negative for fever. + Dizziness Eyes: Negative for visual changes. ENT: Negative for sore throat. Neck: No neck pain  Cardiovascular: Negative for chest pain. Respiratory: Negative for shortness of breath. Gastrointestinal: Negative for abdominal pain, vomiting or diarrhea. Genitourinary: Negative for dysuria. Musculoskeletal: Negative for back pain. Skin: Negative for rash. Neurological: Negative for headaches, weakness or numbness. Psych: No SI or HI. + anxiety  ____________________________________________   PHYSICAL EXAM:  VITAL SIGNS: ED Triage Vitals  Enc Vitals Group     BP 03/19/17 1705 127/89     Pulse Rate 03/19/17 1705 74     Resp 03/19/17 1705 20     Temp 03/19/17 1705 99.2 F (37.3 C)     Temp Source 03/19/17 1705 Oral     SpO2 03/19/17 1705 98 %     Weight 03/19/17 1705 139 lb (63 kg)     Height --      Head Circumference --      Peak Flow --      Pain Score 03/19/17 1704 2     Pain Loc --      Pain Edu? --      Excl. in GC? --     Constitutional:  Alert and oriented. Well appearing and in no apparent distress. HEENT:      Head: Normocephalic and atraumatic.         Eyes: Conjunctivae are normal. Sclera is non-icteric.       Mouth/Throat: Mucous membranes are moist.       Neck: Supple with no signs of meningismus. Cardiovascular: Regular rate and rhythm. No murmurs, gallops, or rubs. 2+ symmetrical distal pulses are present in all extremities. No JVD. Respiratory: Normal respiratory effort. Lungs are clear to auscultation bilaterally. No wheezes, crackles, or rhonchi.  Gastrointestinal: Soft, non tender, and non distended with positive bowel sounds. No rebound or guarding. Genitourinary: No CVA  tenderness. Musculoskeletal: Nontender with normal range of motion in all extremities. No edema, cyanosis, or erythema of extremities. Neurologic: Normal speech and language. Face is symmetric. Moving all extremities. No gross focal neurologic deficits are appreciated. Skin: Skin is warm, dry and intact. No rash noted. Psychiatric: Mood and affect are normal. Speech and behavior are normal.  ____________________________________________   LABS (all labs ordered are listed, but only abnormal results are displayed)  Labs Reviewed  COMPREHENSIVE METABOLIC PANEL - Abnormal; Notable for the following:       Result Value   Glucose, Bld 108 (*)    Total Protein 8.4 (*)    All other components within normal limits  CBC - Abnormal; Notable for the following:    WBC 12.5 (*)    All other components within normal limits  URINALYSIS, COMPLETE (UACMP) WITH MICROSCOPIC - Abnormal; Notable for the following:    Color, Urine YELLOW (*)    APPearance CLEAR (*)    All other components within normal limits  LIPASE, BLOOD   ____________________________________________  EKG  none  ____________________________________________  RADIOLOGY  none  ____________________________________________   PROCEDURES  Procedure(s) performed: None Procedures Critical Care performed:  None ____________________________________________   INITIAL IMPRESSION / ASSESSMENT AND PLAN / ED COURSE   31 y.o. male with h/o anxiety and panic attacks who presents for evaluation of anxiety and concerns of organ failure after taking one month supply of escitalopram over the course of 3 weeks with alcohol. Patient is well-appearing with no signs or symptoms of withdrawal, has normal vital signs, abdomen is soft with no tenderness throughout. CBC shows slight leukocytosis, CMP and lipase are within normal limits. UA with no evidence of dehydration or infection. Had a long discussion with patient about the danger of drinking  alcohol and taking psychiatric medications with then including respiratory depression and death. Recommended that patient does not drink alcohol while on these medications. Recommend close follow-up with patient's primary care doctor. Patient was encouraged and happy to find out that his liver enzymes are within normal limits. Patient denies any coingestions such as Tylenol or salicylates. Patient clear for dc     Pertinent labs & imaging results that were available during my care of the patient were reviewed by me and considered in my medical decision making (see chart for details).    ____________________________________________   FINAL CLINICAL IMPRESSION(S) / ED DIAGNOSES  Final diagnoses:  Dizziness  Anxiety      NEW MEDICATIONS STARTED DURING THIS VISIT:  New Prescriptions   No medications on file     Note:  This document was prepared using Dragon voice recognition software and may include unintentional dictation errors.    Nita SickleVeronese, [place], MD 03/19/17 2008

## 2017-03-20 NOTE — Assessment & Plan Note (Signed)
Pt with recurrent symptoms of dizziness.  Dix-hallpike negative today w/ lack of response to meclizine.  Likely psychogenic symptom.  Plan: 1. Manage anxiety and depression. 2. STOP meclizine 25 mg tid prn.  3. Follow up 1 week.

## 2017-03-20 NOTE — Assessment & Plan Note (Addendum)
Not coping well with anxiety.  Not taking medications as prescribed and is using alcohol for self-medication.  He has not been able to begin applying for jobs.  Pt has violated our controlled substances contract by taking med more frequently than prescribed and will not be prescribed any more clonazepam.  Plan: 1. Continue escitalopram, but increase to 20 mg once daily since initially responded to med. 2. START buspirone 5 mg once daily, then increase by once dose weekly until at tid dosing.   3. Reviewed serotonin syndrome symptoms w/ pt. Call if experiences any tremor, headache, sweating, diarrhea, increased agitation. 4. Avoid all alcohol. 5. Provided information for patient to call [facility] for scheduling a psychiatry visit and counseling.  Discussed possibility of brief inpatient behavioral health treatment, but pt refuses at this time. No indication for involuntary commitment. 6. Discussed mindfulness strategies of deep breathing and body scan w/ addressing thoughts one by one as initial CBT techniques for panic attacks.  Provided single 2 minute body scan practice session. 7. Follow up w/ phone call this week and in office in one week.

## 2017-03-20 NOTE — Progress Notes (Signed)
I have reviewed this encounter including the documentation in this note and/or discussed this patient with the provider, [name] Kennedy, AGPCNP-BC. I am certifying that I agree with the content of this note as supervising physician.  Saralyn PilarAlexander Kaulin Chaves, DO [facility] 03/20/2017, 8:22 PM

## 2017-03-25 ENCOUNTER — Ambulatory Visit (INDEPENDENT_AMBULATORY_CARE_PROVIDER_SITE_OTHER): Payer: Medicaid Other | Admitting: Nurse Practitioner

## 2017-03-25 ENCOUNTER — Encounter: Payer: Self-pay | Admitting: Nurse Practitioner

## 2017-03-25 VITALS — BP 110/72 | HR 66 | Temp 98.3°F | Ht 69.0 in | Wt 138.8 lb

## 2017-03-25 DIAGNOSIS — F418 Other specified anxiety disorders: Secondary | ICD-10-CM | POA: Diagnosis not present

## 2017-03-25 DIAGNOSIS — F5104 Psychophysiologic insomnia: Secondary | ICD-10-CM

## 2017-03-25 DIAGNOSIS — R42 Dizziness and giddiness: Secondary | ICD-10-CM

## 2017-03-25 NOTE — Patient Instructions (Addendum)
Joshua Shaffer, Thank you for coming in to clinic today.  1. For your anxiety:  Call today [facility] or [facility] for an appointment in the next 1-2 weeks for psychiatry and counseling.  [facility])  [address], [place], [place] [address] Phone: [phone]  [facility], available walk-in 9am-4pm M-F [address] [place], [place] [address] Hours: 9am - 4pm (M-F, walk in available) [phone]) [phone]  Stay active w/ phone, games, or other activity to quiet your thoughts.  Medically, there is nothing wrong with your organs or labs.  Sudden death is very unlikely.  2. Neurology referral has been placed for more evaluation of your dizziness. It could still be related to your moods, but this will help us be sure.   Please schedule a follow-up appointment with [name] [name], [name] to Return in about 2 weeks (around 04/08/2017) for anxiety. If you have started seeing [facility] or [facility], we can move this appointment if we need to.  If you have any other questions or concerns, please feel free to call the clinic or send a message through MyChart. You may also schedule an earlier appointment if necessary.  [name] [name], [name], [name]-BC Adult Gerontology Nurse Practitioner [facility], [facility]

## 2017-03-25 NOTE — Progress Notes (Signed)
Subjective:    Patient ID: Joshua Blissavid A Vertz Jr., male    DOB: [birth year], 31 y.o.   MRN: [id number]  Joshua BlissDavid A Nevils Jr. is a 31 y.o. male presenting on 03/25/2017 for Anxiety (depression)   HPI  Anxiety and Depression Pt has been very gradually improving: - improved appetite- Eating 2 x per day, drinking well. - sleeping some each night (although not good quality or more than 2-4 hours.  Less daytime drowsiness. - Less disabling panic  He is taking lexapro 20 mg once daily and buspirone 5 mg tid, tolerating well without side effects other than decreased libido and decreased penile sensation w/ erection.  Denies signs of serotonin syndrome: headaches, tremor, nausea. - Felt more in control this week than last week, but thoughts are still running.  Has not used body scan tool as given at last visit for dealing with racing thoughts or anxiety. - Pt having stress for getting to appointments on time.  Feels scared. - Still feeling anhedonia and generalized irritation.   - Feels that he is going to die and is dying every day.  He went to ED w/ same complaints and has had negative workup w/ normal labs.  Feeling of impending doom related to dizziness and normal physiologic activities of gas/intestinal movement.     Dizziness Persistent dizziness (sitting, lying, standing, w/ activity and w/ anxiety).  Pt states he tried to take out the trash and got dizzy.  Also has heat flushing occasionally w/ dizziness.  Insomnia Pt states he "can't sleep" because he can't make thoughts stop racing.  He is taking melatonin 10 mg - makes him drowsy and then he wakes up soon and has racing thoughts.     Social History  Substance Use Topics  . Smoking status: Current Every Day Smoker    Packs/day: 0.50    Years: 14.00    Types: Cigarettes  . Smokeless tobacco: Never Used  . Alcohol use No     Comment: rarely    Review of Systems Per HPI unless specifically indicated above     Objective:    BP  110/72 (BP Location: Right Arm, Patient Position: Sitting, Cuff Size: Normal)   Pulse 66   Temp 98.3 F (36.8 C) (Oral)   Ht 5\' 9"  (1.753 m)   Wt 138 lb 12.8 oz (63 kg)   BMI 20.50 kg/m   Orthostatic BP: Lying: 105/68 Sitting:110/72 Standing: 110/76  Wt Readings from Last 3 Encounters:  03/25/17 138 lb 12.8 oz (63 kg)  03/19/17 139 lb (63 kg)  03/17/17 139 lb (63 kg)    Physical Exam  General - thin, well-appearing, NAD HEENT - Normocephalic, atraumatic, PERRL, EOMI, patent nares w/o congestion, oropharynx clear, MMM Heart - RRR, no murmurs heard Lungs - Clear throughout all lobes, no wheezing, crackles, or rhonchi. Normal work of breathing. Abdomen - soft, NTND, no masses, no hepatosplenomegaly, active bowel sounds Extremeties - non-tender, no edema, cap refill < 2 seconds, peripheral pulses intact +2 bilaterally Skin - warm, dry, no rashes Neuro - awake, alert, oriented x3, CN II-X intact, intact muscle strength 5/5 bilaterally, intact distal sensation to light touch, normal coordination, normal gait, negative dix-hallpike and forward head tilt.  DTR +2 throughout.  No presence of myoclonus w/ dorsiflexion. Psych - Anxious mood and affect.  Abnormal behavior pt unable to make eye contact, fidgets in chair.  Difficult to obtain history (pt does not want to share information willingly).  Results for orders placed or  performed during the hospital encounter of 03/19/17  Lipase, blood  Result Value Ref Range   Lipase 27 11 - 51 U/L  Comprehensive metabolic panel  Result Value Ref Range   Sodium 135 135 - 145 mmol/L   Potassium 3.8 3.5 - 5.1 mmol/L   Chloride 103 101 - 111 mmol/L   CO2 25 22 - 32 mmol/L   Glucose, Bld 108 (H) 65 - 99 mg/dL   BUN 7 6 - 20 mg/dL   Creatinine, Ser 6.29 0.61 - 1.24 mg/dL   Calcium 9.4 8.9 - 52.8 mg/dL   Total Protein 8.4 (H) 6.5 - 8.1 g/dL   Albumin 4.7 3.5 - 5.0 g/dL   AST 24 15 - 41 U/L   ALT 28 17 - 63 U/L   Alkaline Phosphatase 89 38 - 126  U/L   Total Bilirubin 0.6 0.3 - 1.2 mg/dL   GFR calc non Af Amer >60 >60 mL/min   GFR calc Af Amer >60 >60 mL/min   Anion gap 7 5 - 15  CBC  Result Value Ref Range   WBC 12.5 (H) 3.8 - 10.6 K/uL   RBC 5.24 4.40 - 5.90 MIL/uL   Hemoglobin 16.2 13.0 - 18.0 g/dL   HCT 41.3 24.4 - 01.0 %   MCV 90.5 80.0 - 100.0 fL   MCH 30.9 26.0 - 34.0 pg   MCHC 34.2 32.0 - 36.0 g/dL   RDW 27.2 53.6 - 64.4 %   Platelets 258 150 - 440 K/uL  Urinalysis, Complete w Microscopic  Result Value Ref Range   Color, Urine YELLOW (A) YELLOW   APPearance CLEAR (A) CLEAR   Specific Gravity, Urine 1.016 1.005 - 1.030   pH 5.0 5.0 - 8.0   Glucose, UA NEGATIVE NEGATIVE mg/dL   Hgb urine dipstick NEGATIVE NEGATIVE   Bilirubin Urine NEGATIVE NEGATIVE   Ketones, ur NEGATIVE NEGATIVE mg/dL   Protein, ur NEGATIVE NEGATIVE mg/dL   Nitrite NEGATIVE NEGATIVE   Leukocytes, UA NEGATIVE NEGATIVE   RBC / HPF NONE SEEN 0 - 5 RBC/hpf   WBC, UA 0-5 0 - 5 WBC/hpf   Bacteria, UA NONE SEEN NONE SEEN   Squamous Epithelial / LPF NONE SEEN NONE SEEN   Mucous PRESENT       Assessment & Plan:   Problem List Items Addressed This Visit      Other   Anxiety with depression    Uncontrolled, not coping well with anxiety.  Pt is now taking medications as prescribed.  Small improvements noted in last 1 week for appetite and reduced panic on buspirone 5 mg tid and w/ increased lexapro 20 mg once daily.  Plan: 1. Continue escitalopram 20 mg once daily. 2. Continue buspirone 5 mg tid  3. Reviewed body scan and non-pharm stress management techniques. 4. Pt requested again to call [facility] for scheduling a psychiatry visit and counseling. 5. Follow up in clinic in 2 weeks.      Dizziness - Primary    Pt with recurrent symptoms of dizziness.  Dix-hallpike negative today.  Likely psychogenic symptom, but patient concerned for possibility of incomplete workup.  Plan: 1. Manage anxiety and depression. 2. Proceed w/ neurological  referral. 3. Follow up as needed for worsening symptoms.      Relevant Orders   Ambulatory referral to Neurology   Insomnia    Onset of sleep improved w/ melatonin use of 10 mg nightly.  Pt w/ persistent problem maintaining sleep and returning to sleep once awakened  early.    Plan: 1. Consider mirtazapine in future if persistence w/ insomnia.  Would stop buspirone if using mirtazapine. 2. Encouraged good sleep hygiene. 3. Ok to continue using melatonin 10 mg once at bedtime. 4. Follow up 2 weeks.           Follow up plan: Return in about 2 weeks (around 04/08/2017) for anxiety.   [name], [name], AGPCNP-BC Adult Gerontology Primary Care Nurse Practitioner [facility] 03/27/2017, 11:17 PM

## 2017-03-27 DIAGNOSIS — G4700 Insomnia, unspecified: Secondary | ICD-10-CM | POA: Insufficient documentation

## 2017-03-27 NOTE — Assessment & Plan Note (Signed)
Pt with recurrent symptoms of dizziness.  Dix-hallpike negative today.  Likely psychogenic symptom, but patient concerned for possibility of incomplete workup.  Plan: 1. Manage anxiety and depression. 2. Proceed w/ neurological referral. 3. Follow up as needed for worsening symptoms.

## 2017-03-27 NOTE — Assessment & Plan Note (Signed)
Onset of sleep improved w/ melatonin use of 10 mg nightly.  Pt w/ persistent problem maintaining sleep and returning to sleep once awakened early.    Plan: 1. Consider mirtazapine in future if persistence w/ insomnia.  Would stop buspirone if using mirtazapine. 2. Encouraged good sleep hygiene. 3. Ok to continue using melatonin 10 mg once at bedtime. 4. Follow up 2 weeks.

## 2017-03-27 NOTE — Assessment & Plan Note (Signed)
Uncontrolled, not coping well with anxiety.  Pt is now taking medications as prescribed.  Small improvements noted in last 1 week for appetite and reduced panic on buspirone 5 mg tid and w/ increased lexapro 20 mg once daily.  Plan: 1. Continue escitalopram 20 mg once daily. 2. Continue buspirone 5 mg tid  3. Reviewed body scan and non-pharm stress management techniques. 4. Pt requested again to call [facility] for scheduling a psychiatry visit and counseling. 5. Follow up in clinic in 2 weeks.

## 2017-03-29 NOTE — Progress Notes (Signed)
I have reviewed this encounter including the documentation in this note and/or discussed this patient with the provider, [name] Kennedy, AGPCNP-BC. I am certifying that I agree with the content of this note as supervising physician.  Saralyn PilarAlexander Aleisha Paone, DO [facility] 03/29/2017, 12:16 PM

## 2017-04-07 ENCOUNTER — Ambulatory Visit: Payer: Medicaid Other | Admitting: Nurse Practitioner

## 2017-05-17 ENCOUNTER — Ambulatory Visit (INDEPENDENT_AMBULATORY_CARE_PROVIDER_SITE_OTHER): Payer: Self-pay | Admitting: Nurse Practitioner

## 2017-05-17 VITALS — BP 99/59 | HR 61 | Temp 97.8°F | Ht 69.0 in | Wt 153.8 lb

## 2017-05-17 DIAGNOSIS — F5104 Psychophysiologic insomnia: Secondary | ICD-10-CM

## 2017-05-17 DIAGNOSIS — F418 Other specified anxiety disorders: Secondary | ICD-10-CM

## 2017-05-17 MED ORDER — ESCITALOPRAM OXALATE 20 MG PO TABS: 20.0000 mg | ORAL_TABLET | Freq: Every day | ORAL | 5 refills | Status: DC

## 2017-05-17 MED ORDER — BUSPIRONE HCL 5 MG PO TABS: 5.0000 mg | ORAL_TABLET | Freq: Three times a day (TID) | ORAL | 5 refills | Status: DC

## 2017-05-17 NOTE — Patient Instructions (Addendum)
Joshua Shaffer, Thank you for coming in to clinic today.  1. For your anxiety:  GREAT WORK! - Let me know if anything changes and you need to see me sooner.  2. For your sleep: - START taking melatonin 5 mg one tablet at bedtime for the next 2-4 weeks.   - Once your sleep schedule is reset, take 1/2 tablet for 7 days then STOP.  - Sleep Hygiene Tips  Take medicines only as directed by your health care provider.  Keep regular sleeping and waking hours. Avoid naps.  Keep a sleep diary to help you and your health care provider figure out what could be causing your insomnia. Include:  When you sleep.  When you wake up during the night.  How well you sleep.  How rested you feel the next day.  Any side effects of medicines you are taking.  What you eat and drink.  Make your bedroom a comfortable place where it is easy to fall asleep:  Put up shades or special blackout curtains to block light from outside.  Use a white noise machine to block noise.  Keep the temperature cool.  Exercise regularly as directed by your health care provider. Avoid exercising right before bedtime.  Use relaxation techniques to manage stress. Ask your health care provider to suggest some techniques that may work well for you. These may include:  Breathing exercises.  Routines to release muscle tension.  Visualizing peaceful scenes.  Cut back on alcohol, caffeinated beverages, and cigarettes, especially close to bedtime. These can disrupt your sleep.  Do not overeat or eat spicy foods right before bedtime. This can lead to digestive discomfort that can make it hard for you to sleep.  Limit screen use before bedtime. This includes:  Watching TV.  Using your smartphone, tablet, and computer.  Stick to a routine. This can help you fall asleep faster. Try to do a quiet activity, brush your teeth, and go to bed at the same time each night.  Get out of bed if you are still awake after 15 minutes of  trying to sleep. Keep the lights down, but try reading or doing a quiet activity. When you feel sleepy, go back to bed.  Make sure that you drive carefully. Avoid driving if you feel very sleepy.  Keep all follow-up appointments as directed by your health care provider. This is important.   Please schedule a follow-up appointment with [name] Serigne Kubicek, [name]. Return in about 6 months (around 11/14/2017) for anxiety.  If you have any other questions or concerns, please feel free to call the clinic or send a message through MyChart. You may also schedule an earlier appointment if necessary.  You will receive a survey after today's visit either digitally by e-mail or paper by [employer] mail. Your experiences and feedback matter to us.  Please respond so we know how we are doing as we provide care for you.   [name] Rateel Beldin, [name], [name]-BC Adult Gerontology Nurse Practitioner [facility], [facility]

## 2017-05-17 NOTE — Progress Notes (Signed)
Subjective:    Patient ID: Joshua Bliss., male    DOB: [birth year], 31 y.o.   MRN: [id number]  Joshua Rison. is a 31 y.o. male presenting on 05/17/2017 for Panic Attack (depression. Pt took his last medication on yesterday.)   HPI   Anxiety with Depression and Panic  Pt states he is feeling much better.  Now significantly less anhedonia.  Is able to care for children well again.  Wife is much happier w/ his ability to care for himself and others.  He now cleans house when awake at night.   - Last panic attack was more than 3 weeks ago.   - Feels like he could work again. - Now is no longer dizzy and this is his biggest relief. - Is eating better.  Appetite is significantly improved and has gained weight. - Denies all SI in last 3 weeks.  - Worst ongoing complaint is insomnia.  Pt unable to sleep at night, but is getting uninterrupted sleep during day.  Using TV, electronic devices at night.  States he has tried melatonin in past but it didn't help.  Depression screen [facility] 2/9 05/17/2017 03/25/2017 03/25/2017 03/25/2017 02/25/2017  Decreased Interest 0 Down, Depressed, Hopeless 0 PHQ - 2 Score 0 Altered sleeping Tired, decreased energy 0 Change in appetite 0 Feeling bad or failure about yourself  0 0 0 0 3  Trouble concentrating 0 Moving slowly or fidgety/restless 0 Suicidal thoughts 0 0 0 0 2  PHQ-9 Score Difficult doing work/chores Somewhat difficult Somewhat difficult - - -   GAD 7 : Generalized Anxiety Score 05/17/2017 03/25/2017 03/25/2017 02/25/2017  Nervous, Anxious, on Edge 0 Control/stop worrying 0 Worry too much - different things 0 Trouble relaxing 0 Restless 0 0 0 2  Easily annoyed or irritable Afraid - awful might happen 0 Total GAD 7 Score Anxiety Difficulty Not difficult at all Somewhat difficult - Extremely difficult      Social History  Substance Use Topics  . Smoking status: Current Every Day Smoker    Packs/day: 0.50    Years: 14.00    Types: Cigarettes  . Smokeless tobacco: Never Used  . Alcohol use No     Comment: rarely    Review of Systems Per HPI unless specifically indicated above     Objective:    BP (!) 99/59 (BP Location: Right Arm, Patient Position: Sitting, Cuff Size: Normal)   Pulse 61   Temp 97.8 F (36.6 C) (Oral)   Ht  (1.753 m)   Wt 153 lb 12.8 oz (69.8 kg)   BMI 22.71 kg/m   Wt Readings from Last 3 Encounters:  05/17/17 153 lb 12.8 oz (69.8 kg)  03/25/17 138 lb 12.8 oz (63 kg)  03/19/17 139 lb (63 kg)    Physical Exam  General - healthy, well-appearing, NAD HEENT - Normocephalic, atraumatic Heart - RRR, no murmurs heard Lungs - Clear throughout all lobes, no wheezing, crackles, or rhonchi. Normal work of breathing. Extremeties - non-tender, no edema, cap refill <  2 seconds, peripheral pulses intact +2 bilaterally Skin - warm, dry Neuro - awake, alert, oriented x3, normal gait Psych - Normal mood and affect, normal behavior.  Pt smiling, happy, laughing appropriately.  Is able to converse appropriately and offers information willingly.    Results for orders placed or performed during the hospital encounter of 03/19/17  Lipase, blood  Result Value Ref Range   Lipase 27 11 - 51 U/L  Comprehensive metabolic panel  Result Value Ref Range   Sodium 135 135 - 145 mmol/L   Potassium 3.8 3.5 - 5.1 mmol/L   Chloride 103 101 - 111 mmol/L   CO2 25 22 - 32 mmol/L   Glucose, Bld 108 (H) 65 - 99 mg/dL   BUN 7 6 - 20 mg/dL   Creatinine, Ser 4.09 0.61 - 1.24 mg/dL   Calcium 9.4 8.9 - 81.1 mg/dL   Total Protein 8.4 (H) 6.5 - 8.1 g/dL   Albumin 4.7 3.5 - 5.0 g/dL   AST 24 15 - 41 U/L   ALT 28 17 - 63 U/L   Alkaline Phosphatase 89 38 - 126 U/L   Total Bilirubin 0.6 0.3 - 1.2 mg/dL   GFR calc non Af Amer >60 >60 mL/min   GFR calc Af Amer >60 >60 mL/min   Anion gap  7 5 - 15  CBC  Result Value Ref Range   WBC 12.5 (H) 3.8 - 10.6 K/uL   RBC 5.24 4.40 - 5.90 MIL/uL   Hemoglobin 16.2 13.0 - 18.0 g/dL   HCT 91.4 78.2 - 95.6 %   MCV 90.5 80.0 - 100.0 fL   MCH 30.9 26.0 - 34.0 pg   MCHC 34.2 32.0 - 36.0 g/dL   RDW 21.3 08.6 - 57.8 %   Platelets 258 150 - 440 K/uL  Urinalysis, Complete w Microscopic  Result Value Ref Range   Color, Urine YELLOW (A) YELLOW   APPearance CLEAR (A) CLEAR   Specific Gravity, Urine 1.016 1.005 - 1.030   pH 5.0 5.0 - 8.0   Glucose, UA NEGATIVE NEGATIVE mg/dL   Hgb urine dipstick NEGATIVE NEGATIVE   Bilirubin Urine NEGATIVE NEGATIVE   Ketones, ur NEGATIVE NEGATIVE mg/dL   Protein, ur NEGATIVE NEGATIVE mg/dL   Nitrite NEGATIVE NEGATIVE   Leukocytes, UA NEGATIVE NEGATIVE   RBC / HPF NONE SEEN 0 - 5 RBC/hpf   WBC, UA 0-5 0 - 5 WBC/hpf   Bacteria, UA NONE SEEN NONE SEEN   Squamous Epithelial / LPF NONE SEEN NONE SEEN   Mucus PRESENT       Assessment & Plan:   Problem List Items Addressed This Visit      Other   Anxiety with depression    Controlled.  Anxiety now nearly fully resolved.  Pt is now taking medications as prescribed-buspirone 5 mg tid and lexapro 20 mg once daily. Denies SI/HI and has no plans to carry out if SI/HI arise. PHQ9: 3 down from 13  GAD7: 1 down from 14  Plan: 1. Continue escitalopram 20 mg once daily. 2. Continue buspirone 5 mg tid  3. Follow up 6 months or sooner if needed.      Relevant Medications   busPIRone (BUSPAR) 5 MG tablet   escitalopram (LEXAPRO) 20 MG tablet   Insomnia - Primary    Improved w/ ability to sleep w/o interruption.  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    Plan: 1. Reviewed importance of  good sleep hygiene and some tips for improving sleep schedule.  Pt could consider 3rd shift work when looking for job. 2. START using melatonin 5 mg once at bedtime for 2-4 weeks.  Once schedule reset, take 1/2  tablet for 7 days then STOP 3. Follow up 6 months.         Meds ordered this encounter  Medications  . busPIRone (BUSPAR) 5 MG tablet    Sig: Take 1 tablet (5 mg total) by mouth 3 (three) times daily.    Dispense:  90 tablet    Refill:  5  . escitalopram (LEXAPRO) 20 MG tablet    Sig: Take 1 tablet (20 mg total) by mouth daily.    Dispense:  30 tablet    Refill:  5    Follow up plan: Return in about 6 months (around 11/14/2017) for anxiety.  [name] Qunisha Bryk, [name], AGPCNP-BC Adult Gerontology Primary Care Nurse Practitioner [facility] 05/17/2017, 8:55 AM

## 2017-05-17 NOTE — Assessment & Plan Note (Signed)
Controlled.  Anxiety now nearly fully resolved.  Pt is now taking medications as prescribed-buspirone 5 mg tid and lexapro 20 mg once daily. Denies SI/HI and has no plans to carry out if SI/HI arise. PHQ9: 3 down from 13  GAD7: 1 down from 14  Plan: 1. Continue escitalopram 20 mg once daily. 2. Continue buspirone 5 mg tid  3. Follow up 6 months or sooner if needed.

## 2017-05-17 NOTE — Assessment & Plan Note (Signed)
Improved w/ ability to sleep w/o interruption.  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    Plan: 1. Reviewed importance of good sleep hygiene and some tips for improving sleep schedule.  Pt could consider 3rd shift work when looking for job. 2. START using melatonin 5 mg once at bedtime for 2-4 weeks.  Once schedule reset, take 1/2 tablet for 7 days then STOP 3. Follow up 6 months.

## 2017-12-08 ENCOUNTER — Other Ambulatory Visit: Payer: Self-pay | Admitting: Nurse Practitioner

## 2017-12-08 DIAGNOSIS — F418 Other specified anxiety disorders: Secondary | ICD-10-CM

## 2017-12-09 NOTE — Telephone Encounter (Signed)
Must have appointment prior to additional refills. 

## 2017-12-30 ENCOUNTER — Emergency Department: Payer: No Typology Code available for payment source

## 2017-12-30 ENCOUNTER — Emergency Department
Admission: EM | Admit: 2017-12-30 | Discharge: 2017-12-30 | Disposition: A | Discharge status: Home / Self Care | Payer: No Typology Code available for payment source | Attending: Emergency Medicine | Admitting: Emergency Medicine

## 2017-12-30 ENCOUNTER — Other Ambulatory Visit: Payer: Self-pay

## 2017-12-30 DIAGNOSIS — M545 Low back pain, unspecified: Secondary | ICD-10-CM

## 2017-12-30 DIAGNOSIS — S233XXA Sprain of ligaments of thoracic spine, initial encounter: Secondary | ICD-10-CM | POA: Insufficient documentation

## 2017-12-30 DIAGNOSIS — Y92410 Unspecified street and highway as the place of occurrence of the external cause: Secondary | ICD-10-CM | POA: Insufficient documentation

## 2017-12-30 DIAGNOSIS — S239XXA Sprain of unspecified parts of thorax, initial encounter: Secondary | ICD-10-CM

## 2017-12-30 DIAGNOSIS — Y999 Unspecified external cause status: Secondary | ICD-10-CM | POA: Diagnosis not present

## 2017-12-30 DIAGNOSIS — F17210 Nicotine dependence, cigarettes, uncomplicated: Secondary | ICD-10-CM | POA: Insufficient documentation

## 2017-12-30 DIAGNOSIS — Z79899 Other long term (current) drug therapy: Secondary | ICD-10-CM | POA: Diagnosis not present

## 2017-12-30 DIAGNOSIS — S29002A Unspecified injury of muscle and tendon of back wall of thorax, initial encounter: Secondary | ICD-10-CM | POA: Diagnosis present

## 2017-12-30 DIAGNOSIS — Y9389 Activity, other specified: Secondary | ICD-10-CM | POA: Diagnosis not present

## 2017-12-30 MED ORDER — CYCLOBENZAPRINE HCL 10 MG PO TABS: 10.0000 mg | ORAL_TABLET | Freq: Three times a day (TID) | ORAL | 0 refills | Status: DC | PRN

## 2017-12-30 MED ORDER — NAPROXEN 500 MG PO TABS: 500.0000 mg | ORAL_TABLET | Freq: Two times a day (BID) | ORAL | 0 refills | Status: DC

## 2017-12-30 NOTE — ED Notes (Signed)
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
## 2018-12-12 ENCOUNTER — Encounter: Payer: Self-pay | Admitting: Nurse Practitioner

## 2018-12-12 ENCOUNTER — Ambulatory Visit (INDEPENDENT_AMBULATORY_CARE_PROVIDER_SITE_OTHER): Payer: Medicaid Other | Admitting: Nurse Practitioner

## 2018-12-12 ENCOUNTER — Telehealth: Payer: Self-pay

## 2018-12-12 ENCOUNTER — Other Ambulatory Visit: Payer: Self-pay

## 2018-12-12 DIAGNOSIS — F5104 Psychophysiologic insomnia: Secondary | ICD-10-CM

## 2018-12-12 DIAGNOSIS — F410 Panic disorder [episodic paroxysmal anxiety] without agoraphobia: Secondary | ICD-10-CM

## 2018-12-12 DIAGNOSIS — F418 Other specified anxiety disorders: Secondary | ICD-10-CM

## 2018-12-12 MED ORDER — BUSPIRONE HCL 5 MG PO TABS: 5.0000 mg | ORAL_TABLET | Freq: Three times a day (TID) | ORAL | 1 refills | Status: DC

## 2018-12-12 MED ORDER — HYDROXYZINE HCL 10 MG PO TABS: 10.0000 mg | ORAL_TABLET | Freq: Three times a day (TID) | ORAL | 2 refills | Status: DC | PRN

## 2018-12-12 NOTE — Progress Notes (Signed)
Telemedicine Encounter: Disclosed to patient at start of encounter that we will provide appropriate telemedicine services.  Patient consents to be treated via video visit prior to discussion. - Patient is at his home and is accessed via [email or web address] web platform. - Services are provided by [name] from [facility].  Subjective:    Patient ID: Joshua Bliss., male    DOB: April 01, 1986, 33 y.o.   MRN: [id number]  Joshua Mayernik. is a 33 y.o. male presenting on 12/12/2018 for Anxiety (pt been very emotional crying, panic attacks. Pt been off medication x 4 mths )  HPI Anxiety and panic disorder Patient presents with worsening of his anxiety and panic disorder again.  He stopped his medication once he began feeling better in past and was lost to followup.  Currently notes that anxiety and depression were manageable with good/improved coping skills until last 2-3 weeks.  Now, with additional stressor of Covid-19 pandemic with usual life/home/financial stress, patient is not coping well.  He has had resumption of panic attacks regularly.  - Panic attack symptoms include: Pounding HR, "can't breathe" - Currently using walks outside to help resolve symptoms.  Then comes back inside and sometimes occurs again. Goes to corner.  Has more crying, stress.  Now feels he needs help and states he probably needs to stay on his medication this time. - Last medication - Buspar worked best  GAD 7 : Generalized Anxiety Score 12/12/2018 05/17/2017 03/25/2017 03/25/2017  Nervous, Anxious, on Edge 3 0 1 1  Control/stop worrying 1 0 3 3  Worry too much - different things 1 0 3 3  Trouble relaxing 3 0 3 3  Restless 3 0 0 0  Easily annoyed or irritable 3 1 1 1   Afraid - awful might happen 3 0 3 3  Total GAD 7 Score 17 1 14 14   Anxiety Difficulty Somewhat difficult Not difficult at all Somewhat difficult -    Depression screen [facility] 2/9 12/12/2018 05/17/2017 03/25/2017 03/25/2017 03/25/2017  Decreased  Interest 3 0 1 1 1   Down, Depressed, Hopeless 1 0 1 1 1   PHQ - 2 Score 4 0 2 2 2   Altered sleeping 3 3 3 3 3   Tired, decreased energy 3 0 3 3 3   Change in appetite 2 0 3 3 3   Feeling bad or failure about yourself  1 0 0 0 0  Trouble concentrating 3 0 1 1 1   Moving slowly or fidgety/restless 3 0 1 1 1   Suicidal thoughts 0 0 0 0 0  PHQ-9 Score 19 3 13 13 13   Difficult doing work/chores Somewhat difficult Somewhat difficult Somewhat difficult - -   Social History   Tobacco Use  . Smoking status: Current Every Day Smoker    Packs/day: 0.50    Years: 14.00    Pack years: 7.00    Types: Cigarettes  . Smokeless tobacco: Never Used  Substance Use Topics  . Alcohol use: No    Comment: rarely  . Drug use: No   Review of Systems Per HPI unless specifically indicated above    Objective:    There were no vitals taken for this visit.  Wt Readings from Last 3 Encounters:  12/30/17 180 lb (81.6 kg)  05/17/17 153 lb 12.8 oz (69.8 kg)  03/25/17 138 lb 12.8 oz (63 kg)    Physical Exam Patient remotely monitored.  Verbal communication appropriate.  Cognition normal.   Results for orders placed or  performed during the hospital encounter of 03/19/17  Lipase, blood  Result Value Ref Range   Lipase 27 11 - 51 U/L  Comprehensive metabolic panel  Result Value Ref Range   Sodium 135 135 - 145 mmol/L   Potassium 3.8 3.5 - 5.1 mmol/L   Chloride 103 101 - 111 mmol/L   CO2 25 22 - 32 mmol/L   Glucose, Bld 108 (H) 65 - 99 mg/dL   BUN 7 6 - 20 mg/dL   Creatinine, Ser 1.61 0.61 - 1.24 mg/dL   Calcium 9.4 8.9 - 09.6 mg/dL   Total Protein 8.4 (H) 6.5 - 8.1 g/dL   Albumin 4.7 3.5 - 5.0 g/dL   AST 24 15 - 41 U/L   ALT 28 17 - 63 U/L   Alkaline Phosphatase 89 38 - 126 U/L   Total Bilirubin 0.6 0.3 - 1.2 mg/dL   GFR calc non Af Amer >60 >60 mL/min   GFR calc Af Amer >60 >60 mL/min   Anion gap 7 5 - 15  CBC  Result Value Ref Range   WBC 12.5 (H) 3.8 - 10.6 K/uL   RBC 5.24 4.40 - 5.90 MIL/uL    Hemoglobin 16.2 13.0 - 18.0 g/dL   HCT 04.5 40.9 - 81.1 %   MCV 90.5 80.0 - 100.0 fL   MCH 30.9 26.0 - 34.0 pg   MCHC 34.2 32.0 - 36.0 g/dL   RDW 91.4 78.2 - 95.6 %   Platelets 258 150 - 440 K/uL  Urinalysis, Complete w Microscopic  Result Value Ref Range   Color, Urine YELLOW (A) YELLOW   APPearance CLEAR (A) CLEAR   Specific Gravity, Urine 1.016 1.005 - 1.030   pH 5.0 5.0 - 8.0   Glucose, UA NEGATIVE NEGATIVE mg/dL   Hgb urine dipstick NEGATIVE NEGATIVE   Bilirubin Urine NEGATIVE NEGATIVE   Ketones, ur NEGATIVE NEGATIVE mg/dL   Protein, ur NEGATIVE NEGATIVE mg/dL   Nitrite NEGATIVE NEGATIVE   Leukocytes, UA NEGATIVE NEGATIVE   RBC / HPF NONE SEEN 0 - 5 RBC/hpf   WBC, UA 0-5 0 - 5 WBC/hpf   Bacteria, UA NONE SEEN NONE SEEN   Squamous Epithelial / LPF NONE SEEN NONE SEEN   Mucus PRESENT       Assessment & Plan:   Problem List Items Addressed This Visit      Other   Anxiety with depression - Primary   Relevant Medications   busPIRone (BUSPAR) 5 MG tablet   hydrOXYzine (ATARAX/VISTARIL) 10 MG tablet   Insomnia   Relevant Medications   busPIRone (BUSPAR) 5 MG tablet   hydrOXYzine (ATARAX/VISTARIL) 10 MG tablet    Other Visit Diagnoses    Panic attack       Relevant Medications   busPIRone (BUSPAR) 5 MG tablet   hydrOXYzine (ATARAX/VISTARIL) 10 MG tablet    Uncontrolled and worsening anxiety and panic disorder off medcations today on exam.  Associated with insomnia due to anxiety and panic.  Medications taken previously include buspirone and was previously tolerated well without side effects.  RESTART buspirone 5 mg one tablet twice daily for 1 week. Then increase to three times daily and continue.  Refills provided.  Recommended pt continue non-pharm options for symptom control. Followup 6 weeks.    Meds ordered this encounter  Medications  . busPIRone (BUSPAR) 5 MG tablet    Sig: Take 1 tablet (5 mg total) by mouth 3 (three) times daily. START for first week with  5 mg  twice daily.  Then increase to three times daily and continue.    Dispense:  90 tablet    Refill:  1    Order Specific Question:   Supervising Provider    Answer:   Smitty CordsKARAMALEGOS, ALEXANDER J [[id number]]  . hydrOXYzine (ATARAX/VISTARIL) 10 MG tablet    Sig: Take 1-2 tablets (10-20 mg total) by mouth 3 (three) times daily as needed for anxiety (sleep).    Dispense:  60 tablet    Refill:  2    Order Specific Question:   Supervising Provider    Answer:   Smitty CordsKARAMALEGOS, ALEXANDER J [[id number]]   - Time spent in direct consultation with patient via telemedicine about above concerns: 15 minutes  Follow up plan: Return in about 6 weeks (around 01/23/2019) for anxiety.  [name] Samreet Edenfield, [name], AGPCNP-BC Adult Gerontology Primary Care Nurse Practitioner [facility] 12/12/2018, 3:32 PM

## 2018-12-12 NOTE — Patient Instructions (Signed)
Joshua Shaffer.,   Thank you for coming in to clinic today by videochat.  For anxiety: START buspirone 5 mg by mouth twice daily for 1 week.  Then take three times per day.   For panic attack and sleep, you may take 10-20 mg hydroxyzine as needed.  Take only up to 3 times daily.  If hydroxyzine does not help with panic attacks, please call the clinic within the next 4-7 days.  We can get a quick check in to consider starting clonazepam only for panic attacks, which helped you before.  Remember to get outside for walks and physical activity.  Exercise is a great way to naturally boost your feel good hormones (endorphins).  Remember your deep breathing and body scans to help reduce anxiety.  Focus on slowing your breathing, taking full breaths and feeling your heart rate return to normal. - 4-7-8 breathing technique: breathe in to count of 4, hold breath for count of 7, exhale for count of 8; do 3-5 times for letting go of overactive thoughts -  Remember body scan/grounding exercise.  Start with 2 breaths, scan body head to toe - take slower/deeper breaths, focus on your slowing Heart Rate, release muscle tension. Then end with 2 breaths.   You may need medication long-term to keep anxiety controlled.  We may be able to lower your dose in future, but I would recommend using this medication regularly for the next several years or longer.  Some people require medication life-long.  This is okay.  If you start and stop medications too much and only take them during a crisis, sometimes they don't work as well when you restart them.  This is why it is safest to stay on them even if you are feeling well.  Continue on buspirone even when you feel better.  Once we get you back to a stable dose, we will only require seeing you about every 6 months.  For now, we will follow up closely until you feel better and can get your anxiety improved again.  Let me know in next 3 weeks if buspirone is not helping  improve your symptoms.  Also, remember to call in the next week if your hydroxyzine is not helping with panic attack or with sleep.  Please schedule a follow-up appointment with [name], [name]. Return in about 6 weeks (around 01/23/2019) for anxiety.  If you have any other questions or concerns, please feel free to call the clinic or send a message through MyChart. You may also schedule an earlier appointment if necessary.  You will receive a survey after today's visit either digitally by e-mail or paper by [employer]. Your experiences and feedback matter to [place].  Please respond so we know how we are doing as we provide care for you.   [name], [name], [name]-BC Adult Gerontology Nurse Practitioner [facility], [facility]

## 2018-12-12 NOTE — Telephone Encounter (Signed)
The pt was scheduled for a virtual visit today at 3:20pm to discuss his anxiety and restarting his medications.

## 2018-12-13 ENCOUNTER — Encounter: Payer: Self-pay | Admitting: Nurse Practitioner

## 2018-12-14 ENCOUNTER — Other Ambulatory Visit: Payer: Self-pay

## 2018-12-14 ENCOUNTER — Emergency Department
Admission: EM | Admit: 2018-12-14 | Discharge: 2018-12-14 | Disposition: A | Discharge status: Other Acute Inpatient Hospital | Payer: No Typology Code available for payment source | Attending: Emergency Medicine | Admitting: Emergency Medicine

## 2018-12-14 ENCOUNTER — Encounter: Payer: Self-pay | Admitting: Behavioral Health

## 2018-12-14 ENCOUNTER — Inpatient Hospital Stay
Admission: AD | Admit: 2018-12-14 | Discharge: 2018-12-17 | DRG: 880 | Disposition: A | Discharge status: Home / Self Care | Payer: No Typology Code available for payment source | Source: Intra-hospital | Attending: Psychiatry | Admitting: Psychiatry

## 2018-12-14 DIAGNOSIS — F23 Brief psychotic disorder: Secondary | ICD-10-CM | POA: Diagnosis present

## 2018-12-14 DIAGNOSIS — F1210 Cannabis abuse, uncomplicated: Secondary | ICD-10-CM

## 2018-12-14 DIAGNOSIS — F329 Major depressive disorder, single episode, unspecified: Secondary | ICD-10-CM | POA: Diagnosis present

## 2018-12-14 DIAGNOSIS — F410 Panic disorder [episodic paroxysmal anxiety] without agoraphobia: Principal | ICD-10-CM

## 2018-12-14 DIAGNOSIS — G4700 Insomnia, unspecified: Secondary | ICD-10-CM | POA: Diagnosis present

## 2018-12-14 DIAGNOSIS — F418 Other specified anxiety disorders: Secondary | ICD-10-CM | POA: Diagnosis present

## 2018-12-14 DIAGNOSIS — R45851 Suicidal ideations: Secondary | ICD-10-CM | POA: Diagnosis present

## 2018-12-14 DIAGNOSIS — Z79899 Other long term (current) drug therapy: Secondary | ICD-10-CM | POA: Diagnosis not present

## 2018-12-14 DIAGNOSIS — R443 Hallucinations, unspecified: Secondary | ICD-10-CM

## 2018-12-14 DIAGNOSIS — F17210 Nicotine dependence, cigarettes, uncomplicated: Secondary | ICD-10-CM | POA: Diagnosis present

## 2018-12-14 DIAGNOSIS — R440 Auditory hallucinations: Secondary | ICD-10-CM | POA: Insufficient documentation

## 2018-12-14 DIAGNOSIS — F419 Anxiety disorder, unspecified: Secondary | ICD-10-CM | POA: Insufficient documentation

## 2018-12-14 LAB — LIPID PANEL
Cholesterol: 91 mg/dL (ref 0–200)
HDL: 49 mg/dL (ref >40)
LDL Cholesterol: 24 mg/dL (ref 0–99)
Total CHOL/HDL Ratio: 1.9 RATIO
Triglycerides: 92 mg/dL (ref <150)
VLDL: 18 mg/dL (ref 0–40)

## 2018-12-14 LAB — COMPREHENSIVE METABOLIC PANEL
ALT: 16 U/L (ref 0–44)
AST: 17 U/L (ref 15–41)
Albumin: 4.4 g/dL (ref 3.5–5.0)
Alkaline Phosphatase: 80 U/L (ref 38–126)
Anion gap: 9 (ref 5–15)
BUN: 6 mg/dL (ref 6–20)
CO2: 26 mmol/L (ref 22–32)
Calcium: 9.2 mg/dL (ref 8.9–10.3)
Chloride: 105 mmol/L (ref 98–111)
Creatinine, Ser: 0.96 mg/dL (ref 0.61–1.24)
GFR calc Af Amer: >60 mL/min (ref >60)
GFR calc non Af Amer: >60 mL/min (ref >60)
Glucose, Bld: 105 mg/dL — ABNORMAL HIGH (ref 70–99)
Potassium: 3.4 mmol/L — ABNORMAL LOW (ref 3.5–5.1)
Sodium: 140 mmol/L (ref 135–145)
Total Bilirubin: 0.6 mg/dL (ref 0.3–1.2)
Total Protein: 8.3 g/dL — ABNORMAL HIGH (ref 6.5–8.1)

## 2018-12-14 LAB — CBC
HCT: 44.3 % (ref 39.0–52.0)
Hemoglobin: 15 g/dL (ref 13.0–17.0)
MCH: 30.2 pg (ref 26.0–34.0)
MCHC: 33.9 g/dL (ref 30.0–36.0)
MCV: 89.3 fL (ref 80.0–100.0)
Platelets: 321 10*3/uL (ref 150–400)
RBC: 4.96 MIL/uL (ref 4.22–5.81)
RDW: 13.2 % (ref 11.5–15.5)
WBC: 14.2 10*3/uL — ABNORMAL HIGH (ref 4.0–10.5)
nRBC: 0 % (ref 0.0–0.2)

## 2018-12-14 LAB — TSH: TSH: 1.665 u[IU]/mL (ref 0.350–4.500)

## 2018-12-14 LAB — ETHANOL: Alcohol, Ethyl (B): <10 mg/dL (ref <10)

## 2018-12-14 MED ORDER — LORAZEPAM 1 MG PO TABS: 1.0000 mg | ORAL_TABLET | ORAL | Status: DC | PRN

## 2018-12-14 MED ORDER — BUSPIRONE HCL 5 MG PO TABS
10.0000 mg | ORAL_TABLET | Freq: Two times a day (BID) | ORAL | Status: DC
  Administered 2018-12-14: 14:00:00 10 mg via ORAL
  Filled 2018-12-14: qty 2

## 2018-12-14 MED ORDER — BUSPIRONE HCL 5 MG PO TABS
10.0000 mg | ORAL_TABLET | Freq: Two times a day (BID) | ORAL | Status: DC
  Administered 2018-12-14 – 2018-12-15 (×3): 10 mg via ORAL
  Filled 2018-12-14 (×3): qty 2

## 2018-12-14 MED ORDER — RISPERIDONE 1 MG PO TABS: 2.0000 mg | ORAL_TABLET | Freq: Every day | ORAL | Status: DC

## 2018-12-14 MED ORDER — ZIPRASIDONE MESYLATE 20 MG IM SOLR
20.0000 mg | Freq: Once | INTRAMUSCULAR | Status: AC
Start: 2018-12-14 — End: 2018-12-14
  Administered 2018-12-14: 20 mg via INTRAMUSCULAR
  Filled 2018-12-14: qty 20

## 2018-12-14 MED ORDER — PAROXETINE HCL 20 MG PO TABS
20.0000 mg | ORAL_TABLET | Freq: Every day | ORAL | Status: DC
  Administered 2018-12-14: 20 mg via ORAL
  Filled 2018-12-14 (×2): qty 1

## 2018-12-14 MED ORDER — RISPERIDONE 1 MG PO TBDP
2.0000 mg | ORAL_TABLET | Freq: Three times a day (TID) | ORAL | Status: DC | PRN
  Filled 2018-12-14: qty 2

## 2018-12-14 MED ORDER — MAGNESIUM HYDROXIDE 400 MG/5ML PO SUSP: 30.0000 mL | Freq: Every day | ORAL | Status: DC | PRN

## 2018-12-14 MED ORDER — ZIPRASIDONE MESYLATE 20 MG IM SOLR: 20.0000 mg | INTRAMUSCULAR | Status: DC | PRN

## 2018-12-14 MED ORDER — ALUM & MAG HYDROXIDE-SIMETH 200-200-20 MG/5ML PO SUSP: 30.0000 mL | ORAL | Status: DC | PRN

## 2018-12-14 MED ORDER — HYDROXYZINE HCL 25 MG PO TABS
25.0000 mg | ORAL_TABLET | Freq: Four times a day (QID) | ORAL | Status: DC | PRN
  Administered 2018-12-16: 17:00:00 25 mg via ORAL
  Filled 2018-12-14: qty 1

## 2018-12-14 MED ORDER — RISPERIDONE 1 MG PO TABS
2.0000 mg | ORAL_TABLET | Freq: Every day | ORAL | Status: DC
  Administered 2018-12-14 – 2018-12-15 (×2): 2 mg via ORAL
  Filled 2018-12-14 (×2): qty 2

## 2018-12-14 MED ORDER — DIPHENHYDRAMINE HCL 25 MG PO CAPS
50.0000 mg | ORAL_CAPSULE | Freq: Every evening | ORAL | Status: DC | PRN
  Administered 2018-12-16: 50 mg via ORAL
  Filled 2018-12-14: qty 2

## 2018-12-14 MED ORDER — PAROXETINE HCL 20 MG PO TABS
20.0000 mg | ORAL_TABLET | Freq: Every day | ORAL | Status: DC
  Administered 2018-12-15: 20 mg via ORAL
  Filled 2018-12-14: qty 1

## 2018-12-14 MED ORDER — ACETAMINOPHEN 325 MG PO TABS: 650.0000 mg | ORAL_TABLET | Freq: Four times a day (QID) | ORAL | Status: DC | PRN

## 2018-12-14 NOTE — Consult Note (Signed)
[facility] Face-to-Face Psychiatry Consult   Reason for Consult: Hallucinations Referring Physician: Dr. Scotty Court Patient Identification: Joshua Shaffer. MRN:  [id number] Principal Diagnosis: Hallucinations Diagnosis:  Principal Problem:   Hallucinations Active Problems:   Anxiety with depression   Insomnia   Marijuana abuse  Patient is seen, chart is reviewed, collateral obtained from wife Joshua Shaffer) Total Time spent with patient: 1 hour  Subjective: "I need help, my anxiety is so bad and I am really depressed.  I have been sleeping and now I am hallucinating.  My brother has really bad schizophrenia and I am scared."  HPI:  Joshua Shaffer. is a 33 y.o. male patient with medical history as listed below presents to the emergency department via EMS with complaints of auditory hallucinations (stating "U gonna die U gonna die" as well as other threatening statements) and paranoia (patient states feel like someone is going to kill me".  Patient denies any suicidal or homicidal ideation.  Patient states that he has been taking BuSpar and hydroxyzine for the past 3 days without any improvement.  On evaluation today, patient is asleep in bed but arouses easily.  Patient reports, "the medication they gave me last night (Geodon IM) seem to help.  I got some sleep and I am not hearing voices right now.  Patient states, "I need help.  I have been feeling really bad, and I am worried I might hurt myself or someone else."  Patient describes that he has been having increased anxiety since the beginning of the Covid-19 pandemic, which has been exacerbated by him now having to care for his 3 children at home while home-schooling them.  He also has increased anxiety due to his wife working as a Lawyer in a nursing home, and fears that she will get sick or the family will get sick.  Patient reports that he has been unable to leave the house in years, and endorses guilt that his wife has to take care of everything  outside of the house.  Patient reports that he has not been sleeping well or eating well for the past month and has had weight loss.  He describes poor energy and concentration, and has been isolating himself from others except his children and wife.  Patient has become increasingly paranoid around the television, phone, and alarm clock, stating that he is getting messages from them that he is going to die.  He reports he has been hearing auditory hallucinations from male and male voices that are frightening to him.  He reports that he has obsessive thoughts which are keeping him from sleeping.  Patient endorses marijuana use on Friday December 09, 2018 in an attempt to help manage his anxiety, however he reports he had a panic attack after smoking marijuana and has been having auditory hallucinations, somatic complaints "feeling my guts move and my heart racing", getting messages from the television, phone, alarm clock and hearing voices to harm myself and others." Patient endorses that he has been having some suicidal thoughts which prompted him to see his primary care provider on Monday.  Buspirone and hydroxyzine were started, however patient has not found these to be effective.  Patient is denying suicidal plan or intent this morning.  He denies homicidal ideation.  He is currently denying auditory hallucinations (after receiving Geodon).  He denies visual hallucinations.  Patient is requesting medication for anxiety and depression, however is fearful that this will worsen suicidal thoughts as it has in the past.  Patient is agreeable to starting antipsychotic medication for treatment of auditory hallucinations which are also disturbing and causing him to have suicidal thoughts.  Patient is agreeable to inpatient admission.  Collateral obtained from wife, Joshua Shaffer: Wife concurs with HPI above.  She states that patient has been overly excited, having uncontrolled panic attacks despite her trying to talk him through  them.  She describes that he has become increasingly paranoid refusing to be around electronics for fear of messages being sent to him.  Wife has noted that his children who are now being home-schooled have expressed concerns for patient's bizarre behavior.  She reports that she and children feel safe, however are worried that behaviors will continue to escalate.   Past Psychiatric History: Anxiety and depression managed by outpatient primary care provider. Patient describes having frightening suicide thoughts with Lexapro.  Patient reports sexual side effects with Lexapro that was tolerable. Patient describes he has tolerated buspirone 10 mg twice daily well in the past with improvement in his anxiety.  Risk to Self:  Yes Risk to Others:  Yes Prior Inpatient Therapy:  None Prior Outpatient Therapy:  Follows with primary care provider for depression and anxiety.  Past Medical History:  Past Medical History:  Diagnosis Date  . Dizziness     Past Surgical History:  Procedure Laterality Date  . TONSILLECTOMY     Family History:  Family History  Problem Relation Age of Onset  . Lung cancer Maternal Grandfather    Family Psychiatric  History: Schizophrenia in younger brother No known suicides  Social History:  Social History   Substance and Sexual Activity  Alcohol Use No   Comment: rarely     Social History   Substance and Sexual Activity  Drug Use No    Social History   Socioeconomic History  . Marital status: Married    Spouse name: Not on file  . Number of children: Not on file  . Years of education: Not on file  . Highest education level: Not on file  Occupational History  . Not on file  Social Needs  . Financial resource strain: Not on file  . Food insecurity:    Worry: Not on file    Inability: Not on file  . Transportation needs:    Medical: Not on file    Non-medical: Not on file  Tobacco Use  . Smoking status: Current Every Day Smoker    Packs/day:  0.50    Years: 14.00    Pack years: 7.00    Types: Cigarettes  . Smokeless tobacco: Never Used  Substance and Sexual Activity  . Alcohol use: No    Comment: rarely  . Drug use: No  . Sexual activity: Not on file  Lifestyle  . Physical activity:    Days per week: Not on file    Minutes per session: Not on file  . Stress: Not on file  Relationships  . Social connections:    Talks on phone: Not on file    Gets together: Not on file    Attends religious service: Not on file    Active member of club or organization: Not on file    Attends meetings of clubs or organizations: Not on file    Relationship status: Not on file  Other Topics Concern  . Not on file  Social History Narrative  . Not on file   Additional Social History:  Patient lives with wife and 3 children (3514 and 32 year old sons, 501 year old daughter).  He has become primary caregiver at home with having to school the children during COVID-19 pandemic. 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  Patient stopped working due to severe anxiety and panic attacks at work.  Patient attempted to work from home unsuccessfully. Patient currently not on disability.  Patient reports that he quit drinking alcohol and other substance use approximately 1 year ago.  He stopped psychotropic medication at this time also.  Patient endorses marijuana use on Friday December 09, 2018 in an attempt to help manage his anxiety, however he reports he had a panic attack after smoking marijuana and has been having auditory hallucinations, somatic complaints "feeling my guts move and my heart racing", getting messages from the television, phone, alarm clock and hearing voices to harm myself and others."   Allergies:  No Known Allergies  Labs:  Results for orders placed or performed during the hospital encounter of 12/14/18 (from the past 48 hour(s))  CBC     Status: Abnormal   Collection Time: 12/14/18  5:02 AM  Result Value Ref Range    WBC 14.2 (H) 4.0 - 10.5 K/uL   RBC 4.96 4.22 - 5.81 MIL/uL   Hemoglobin 15.0 13.0 - 17.0 g/dL   HCT 16.1 09.6 - 04.5 %   MCV 89.3 80.0 - 100.0 fL   MCH 30.2 26.0 - 34.0 pg   MCHC 33.9 30.0 - 36.0 g/dL   RDW 40.9 81.1 - 91.4 %   Platelets 321 150 - 400 K/uL   nRBC 0.0 0.0 - 0.2 %    Comment: Performed at [facility], [address] [place], [place] [address]  Comprehensive metabolic panel     Status: Abnormal   Collection Time: 12/14/18  5:02 AM  Result Value Ref Range   Sodium 140 135 - 145 mmol/L   Potassium 3.4 (L) 3.5 - 5.1 mmol/L   Chloride 105 98 - 111 mmol/L   CO2 26 22 - 32 mmol/L   Glucose, Bld 105 (H) 70 - 99 mg/dL   BUN 6 6 - 20 mg/dL   Creatinine, Ser 6.21 0.61 - 1.24 mg/dL   Calcium 9.2 8.9 - 30.8 mg/dL   Total Protein 8.3 (H) 6.5 - 8.1 g/dL   Albumin 4.4 3.5 - 5.0 g/dL   AST 17 15 - 41 U/L   ALT 16 0 - 44 U/L   Alkaline Phosphatase 80 38 - 126 U/L   Total Bilirubin 0.6 0.3 - 1.2 mg/dL   GFR calc non Af Amer >60 >60 mL/min   GFR calc Af Amer >60 >60 mL/min   Anion gap 9 5 - 15    Comment: Performed at [facility], [address] [place], [place] [address]  TSH     Status: None   Collection Time: 12/14/18  5:02 AM  Result Value Ref Range   TSH 1.665 0.350 - 4.500 uIU/mL    Comment: Performed by a 3rd Generation assay with a functional sensitivity of <=0.01 uIU/mL. Performed at [facility], [address] [place], [place] [address]   Ethanol     Status: None   Collection Time: 12/14/18  5:02 AM  Result Value Ref Range   Alcohol, Ethyl (B) <10 <10 mg/dL    Comment: (NOTE) Lowest detectable limit for serum alcohol is 10 mg/dL. For medical purposes only. Performed at [facility], [address] [place], [place] [address]     No current facility-administered medications for this encounter.  Current Outpatient Medications  Medication Sig Dispense Refill  . busPIRone (BUSPAR) 5 MG tablet Take 1 tablet (5 mg  total) by mouth 3 (three) times daily. START for first week with 5 mg twice daily.  Then increase to three times daily and continue. 90 tablet 1  . hydrOXYzine (ATARAX/VISTARIL) 10 MG tablet Take 1-2 tablets (10-20 mg total) by mouth 3 (three) times daily as needed for anxiety (sleep). 60 tablet 2    Musculoskeletal: Strength & Muscle Tone: within normal limits Gait & Station: normal Patient leans: N/A  Psychiatric Specialty Exam: Physical Exam  Nursing note and vitals reviewed. Constitutional: He is oriented to person, place, and time. He appears well-developed and well-nourished. No distress.  HENT:  Head: Normocephalic and atraumatic.  Eyes: EOM are normal.  Neck: Normal range of motion.  Cardiovascular: Normal rate and regular rhythm.  Respiratory: Effort normal. No respiratory distress.  Musculoskeletal: Normal range of motion.  Neurological: He is alert and oriented to person, place, and time.  Psychiatric: His mood appears anxious. His speech is delayed. He is slowed, withdrawn and actively hallucinating. He is not agitated and not aggressive. Thought content is paranoid. Cognition and memory are normal. He does not express impulsivity or inappropriate judgment. He exhibits a depressed mood. He expresses no homicidal and no suicidal ideation.    Review of Systems  Constitutional: Positive for weight loss (decreased appetite).  HENT: Negative.   Eyes: Negative.   Respiratory: Negative.   Cardiovascular: Negative.   Gastrointestinal: Negative.   Musculoskeletal: Negative.   Skin: Negative.   Neurological: Negative.   Psychiatric/Behavioral: Positive for depression, hallucinations and substance abuse. Negative for memory loss and suicidal ideas. The patient is nervous/anxious and has insomnia.     Blood pressure (!) 117/92, pulse 77, temperature 98.1 F (36.7 C), temperature source Oral, resp. rate 18, height 6' (1.829 m), weight 68 kg, SpO2 100 %.Body mass index is 20.34  kg/m.  General Appearance: Casual and Guarded  Eye Contact:  Good  Speech:  Clear and Coherent and Normal Rate  Volume:  Decreased  Mood:  Anxious and fearful  Affect:  Appropriate and Congruent  Thought Process:  Coherent and Descriptions of Associations: Intact  Orientation:  Full (Time, Place, and Person)  Thought Content:  Hallucinations: Auditory, Obsessions, Paranoid Ideation and Rumination  Suicidal Thoughts:  No  Homicidal Thoughts:  No  Memory:  fair  Judgement:  Good  Insight:  Fair  Psychomotor Activity:  Decreased  Concentration:  Concentration: Fair  Recall:  Good  Fund of Knowledge:  Good  Language:  Good  Akathisia:  No  Handed:  Right  AIMS (if indicated):   N/A  Assets:  Communication Skills Desire for Improvement Housing Intimacy Social Support  ADL's:  Intact  Cognition:  WNL  Sleep:   Has not slept well in 5 days prior to arriving to the hospital     Treatment Plan Summary: Daily contact with patient to assess and evaluate symptoms and progress in treatment and Medication management  Start Paxil 20 mg daily for depression and anxiety Start buspirone 10 mg twice daily for anxiety Start Risperdal 2 mg daily at bedtime for auditory hallucinations, obsessive thoughts, and enhance sedating qualities for sleep.  Discussed with patient need to avoid marijuana due to strong family history of schizophrenia and psychotic reaction and panic attacks with this use. Reviewed with patient indication for antipsychotic treatment for current acute psychosis with follow-up to be arranged with outpatient psychiatrist after discharge regarding  need for ongoing treatment.  Labs reviewed: Hemoglobin A1c and lipids added to existing blood work for baseline metabolic profile.  Disposition: Recommend psychiatric Inpatient admission when medically cleared. Supportive therapy provided about ongoing stressors. Patient is Voluntary and agrees to inpatient psychiatric  admission.  Orders placed for admission.  Mariel Craft, MD 12/14/2018 9:50 AM

## 2018-12-14 NOTE — ED Triage Notes (Signed)
Patient to ED with [facility] for C/O AVH and Paranoia.

## 2018-12-14 NOTE — BH Assessment (Addendum)
Patient is to be admitted to [facility] by Dr. Viviano Simas.  Attending Physician will be Dr. Toni Amend.   Patient has been assigned to room 320, by [facility] Charge Nurse Shatara.   Intake Paper Work has been signed and placed on patient chart.  ER staff is aware of the admission:  Glenda, ER Secretary    Dr. Scotty Court, ER MD   Geralynn Ochs, Patient's Nurse   Ethelene Browns, Patient Access.

## 2018-12-14 NOTE — ED Provider Notes (Signed)
[facility] Emergency Department Provider Note    First MD Initiated Contact with Patient 12/14/18 [id number]     (approximate)  I have reviewed the triage vital signs and the nursing notes.   HISTORY  Chief Complaint Hallucinations (Paranoia/AVH)    HPI Joshua BlissDavid A Sherley Jr. is a 33 y.o. male with medical history as listed below presents to the emergency department via EMS with complaints of auditory hallucinations (stating "U gonna die U gonna die" as well as other threatening statements) and paranoia (patient states feel like someone is going to kill me".  Patient denies any suicidal or homicidal ideation.  Patient states that he has been taking BuSpar and hydroxyzine for the past 3 days without any improvement.        Past Medical History:  Diagnosis Date  . Dizziness     Patient Active Problem List   Diagnosis Date Noted  . Insomnia 03/27/2017  . Anxiety with depression 02/25/2017  . Dizziness 02/25/2017    Past Surgical History:  Procedure Laterality Date  . TONSILLECTOMY      Prior to Admission medications   Medication Sig Start Date End Date Taking? Authorizing Provider  busPIRone (BUSPAR) 5 MG tablet Take 1 tablet (5 mg total) by mouth 3 (three) times daily. START for first week with 5 mg twice daily.  Then increase to three times daily and continue. 12/12/18   Galen ManilaKennedy, Lauren Renee, NP  hydrOXYzine (ATARAX/VISTARIL) 10 MG tablet Take 1-2 tablets (10-20 mg total) by mouth 3 (three) times daily as needed for anxiety (sleep). 12/12/18   Galen ManilaKennedy, Lauren Renee, NP    Allergies Patient has no known allergies.  Family History  Problem Relation Age of Onset  . Lung cancer Maternal Grandfather     Social History Social History   Tobacco Use  . Smoking status: Current Every Day Smoker    Packs/day: 0.50    Years: 14.00    Pack years: 7.00    Types: Cigarettes  . Smokeless tobacco: Never Used  Substance Use Topics  . Alcohol use: No   Comment: rarely  . Drug use: No    Review of Systems Constitutional: No fever/chills Eyes: No visual changes. ENT: No sore throat. Cardiovascular: Denies chest pain. Respiratory: Denies shortness of breath. Gastrointestinal: No abdominal pain.  No nausea, no vomiting.  No diarrhea.  No constipation. Genitourinary: Negative for dysuria. Musculoskeletal: Negative for neck pain.  Negative for back pain. Integumentary: Negative for rash. Neurological: Negative for headaches, focal weakness or numbness. Psychiatric:  Positive for auditory hallucinations and paranoia   ____________________________________________   PHYSICAL EXAM:  VITAL SIGNS: ED Triage Vitals  Enc Vitals Group     BP 12/14/18 0458 (!) 117/92     Pulse Rate 12/14/18 0458 77     Resp 12/14/18 0458 18     Temp 12/14/18 0458 98.1 F (36.7 C)     Temp Source 12/14/18 0458 Oral     SpO2 12/14/18 0458 100 %     Weight 12/14/18 0459 68 kg (150 lb)     Height 12/14/18 0459 1.829 m (6')     Head Circumference --      Peak Flow --      Pain Score 12/14/18 0511 0     Pain Loc --      Pain Edu? --      Excl. in GC? --     Constitutional: Alert and oriented. Well appearing and in no acute distress. Eyes: Conjunctivae  are normal. Mouth/Throat: Mucous membranes are moist.  Oropharynx non-erythematous. Neck: No stridor.  Cardiovascular: Normal rate, regular rhythm. Good peripheral circulation. Grossly normal heart sounds. Respiratory: Normal respiratory effort.  No retractions. Lungs CTAB. Gastrointestinal: Soft and nontender. No distention.  Musculoskeletal: No lower extremity tenderness nor edema. No gross deformities of extremities. Neurologic:  Normal speech and language. No gross focal neurologic deficits are appreciated.  Skin:  Skin is warm, dry and intact. No rash noted. Psychiatric: Positive for auditory hallucinations and paranoia patient very paranoid during evaluation repetitively stating "I'm scared"   ____________________________________________   LABS (all labs ordered are listed, but only abnormal results are displayed)  Labs Reviewed  CBC - Abnormal; Notable for the following components:      Result Value   WBC 14.2 (*)    All other components within normal limits  COMPREHENSIVE METABOLIC PANEL  URINE DRUG SCREEN, QUALITATIVE ([facility] ONLY)  TSH  ETHANOL       Procedures   ____________________________________________   INITIAL IMPRESSION / MDM / ASSESSMENT AND PLAN / ED COURSE  As part of my medical decision making, I reviewed the following data within the electronic MEDICAL RECORD NUMBER   Joshua Shaffer. was evaluated in Emergency Department on 12/14/2018 for the symptoms described in the history of present illness. He was evaluated in the context of the global COVID-19 pandemic, which necessitated consideration that the patient might be at risk for infection with the SARS-CoV-2 virus that causes COVID-19. Institutional protocols and algorithms that pertain to the evaluation of patients at risk for COVID-19 are in a state of rapid change based on information released by regulatory bodies including the CDC and federal and state organizations. These policies and algorithms were followed during the patient's care in the ED.     33 year old male presenting with above-stated history and physical exam secondary to auditory hallucinations and paranoid delusions.  Patient given Geodon 20 mg IM.  Awaiting psychiatry consultation. ____________________________________________  FINAL CLINICAL IMPRESSION(S) / ED DIAGNOSES  Final diagnoses:  Auditory hallucinations     MEDICATIONS GIVEN DURING THIS VISIT:  Medications  ziprasidone (GEODON) injection 20 mg (20 mg Intramuscular Given 12/14/18 0533)     ED Discharge Orders    None       Note:  This document was prepared using Dragon voice recognition software and may include unintentional dictation errors.   Darci Current,  MD 12/14/18 [phone]

## 2018-12-14 NOTE — ED Notes (Signed)
Patient asked to go to the bathroom, patient then came out of the bathroom and said "I don't know if its me, or if im hearing things or what, but something is telling me to end it all". Writer asked him if he wanted to end it all, patient denied SI, and said he doesn't know why he is feeling like that and went into his room and laid down. NAD noted.

## 2018-12-14 NOTE — ED Notes (Signed)
Patient clothing: Pair of tan boots Pair of jeans with web belt Black pul over shirt LG cell phone

## 2018-12-14 NOTE — ED Notes (Signed)
Hourly rounding reveals patient sleeping in room. No complaints, stable, in no acute distress. Q15 minute rounds and monitoring via Security to continue. 

## 2018-12-14 NOTE — ED Notes (Signed)
Patient talking to psychiatrist  

## 2018-12-14 NOTE — Tx Team (Signed)
Initial Treatment Plan 12/14/2018 6:06 PM Joshua Shaffer. TKZ:[id number]    PATIENT STRESSORS: Medication change or noncompliance Other: auditory hallucinations   PATIENT STRENGTHS: Ability for insight Capable of independent living Communication skills Motivation for treatment/growth   PATIENT IDENTIFIED PROBLEMS: paranoia  Medication changes  Auditory hallucinations  Insomnia  Poor appetite             DISCHARGE CRITERIA:  Ability to meet basic life and health needs Adequate post-discharge living arrangements Improved stabilization in mood, thinking, and/or behavior  PRELIMINARY DISCHARGE PLAN: Attend PHP/IOP Outpatient therapy Return to previous living arrangement  PATIENT/FAMILY INVOLVEMENT: This treatment plan has been presented to and reviewed with the patient, Joshua Shaffer., and/or family member.  The patient and family have been given the opportunity to ask questions and make suggestions.  Leamon Arnt, RN 12/14/2018, 6:06 PM

## 2018-12-14 NOTE — Plan of Care (Signed)
Joshua Shaffer. is a 33 y.o. male patient admitted from ED awake, alert - oriented  X 4 - no acute distress noted.  VSS - Blood pressure 107/72, pulse 62, temperature 98.3 F (36.8 C), temperature source Oral, resp. rate 18, height 6' (1.829 m), weight 60.3 kg, SpO2 99 %.   Patient admitted due to having auditory hallucinations. Patient states, " Felt like the television was watching me and his body was reacting to the television and the heart beat was in my stomach and I felt a thump in my chest. I just went and was put on Buspar and Hydroxyzine this week but its not helping." Patient  states he has not been eating well or sleeping well during the night. Patient hearing voices. " They are in my head like right here saying," I am going to kill you. Its men and women voices." Patient is pleasant, denies SI and HI. Skin intact  only one tattoo located on the side of his neck. Orientation to room, and floor completed with information packet given to patient.  Patient declined safety video at this time.  Admission INP armband ID verified with patient, and in place.   , fall assessment complete, with patient and family able to verbalize understanding of risk associated with falls, and verbalized understanding how to use  call light  which is within reach, patient able to voice, and demonstrate understanding.  Skin, clean-dry- intact without evidence of bruising, or skin tears.   No evidence of skin break down noted on exam. Will cont to eval and treat per MD orders.  Leamon Arnt, RN 12/14/2018 5:30 PM

## 2018-12-14 NOTE — ED Notes (Signed)
Pair white metal earrings with white stones placed with personal belongings.

## 2018-12-14 NOTE — BH Assessment (Signed)
Assessment Note  Joshua BlissDavid A Defelice Shaffer. is an 33 y.o. male who reports he has been experiencing auditory hallucinations and passive suicidal thoughts with no specific plan. Pt reports "hearing multiple voices that are different sex - for a couple days". When asked about suicidal thoughts, pt reports "there was something telling me to do it". Pt also reports "I can't stand in front of the television" - pt reports he was getting messages from the television. Pt's UDS was positive for Cannabis - Pt denied use of all other substances. Pt denied homicidal ideations. He reports his medications being ineffective in the past, but would like to restart his medications to control his thoughts and anxiety sxs. Pt was alert and cooperative while clear and coherent in his speech during assessment.     Diagnosis: Acute Psychosis  Past Medical History:  Past Medical History:  Diagnosis Date  . Dizziness     Past Surgical History:  Procedure Laterality Date  . TONSILLECTOMY      Family History:  Family History  Problem Relation Age of Onset  . Lung cancer Maternal Grandfather     Social History:  reports that he has been smoking cigarettes. He has a 7.00 pack-year smoking history. He has never used smokeless tobacco. He reports that he does not drink alcohol or use drugs.  Additional Social History:  Alcohol / Drug Use Pain Medications: See MAR Prescriptions: See MAR Over the Counter: See MAR History of alcohol / drug use?: Yes Longest period of sobriety (when/how long): Unable to quantify Negative Consequences of Use: Personal relationships, Financial Withdrawal Symptoms: (None Reported) Substance #1 Name of Substance 1: Cannabis 1 - Age of First Use: Unable to recall 1 - Amount (size/oz): Unable to quantify 1 - Frequency: Unable to quantify 1 - Duration: Unable to quantify 1 - Last Use / Amount: 12/13/2018  CIWA: CIWA-Ar BP: (!) 117/92 Pulse Rate: 77 COWS:    Allergies: No Known  Allergies  Home Medications: (Not in a hospital admission)   OB/GYN Status:  No LMP for male patient.  General Assessment Data [facility] Assessment: In system Is this a Tele or Face-to-Face Assessment?: Face-to-Face Is this an Initial Assessment or a Re-assessment for this encounter?: Initial Assessment Patient Accompanied by:: N/A Language Other than English: No Living Arrangements: (Private Residence) What gender do you identify as?: Male Marital status: Married ArtistMaiden name: N/A Pregnancy Status: No Living Arrangements: Spouse/significant other, Children(3 children 57(14 yo, 32 yo, 10410 yo)) Can pt return to current living arrangement?: Yes Admission Status: Voluntary Is patient capable of signing voluntary admission?: Yes Referral Source: Self/Family/Friend Insurance type: None  Medical Screening Exam [facility] Walk-[facility] ONLY) Medical Exam completed: Yes  Crisis Care Plan Living Arrangements: Spouse/significant other, Children(3 children 92(14 yo, 32 yo, 32 yo)) Legal Guardian: Other:(Self) Name of Psychiatrist: [facility] Name of Therapist: [facility]  Education Status Is patient currently in school?: No Is the patient employed, unemployed or receiving disability?: Unemployed  Risk to self with the past 6 months Suicidal Ideation: Yes-Currently Present Has patient been a risk to self within the past 6 months prior to admission? : Yes Suicidal Intent: Yes-Currently Present Has patient had any suicidal intent within the past 6 months prior to admission? : Yes Is patient at risk for suicide?: Yes Suicidal Plan?: No Has patient had any suicidal plan within the past 6 months prior to admission? : No Access to Means: Yes Specify Access to Suicidal Means: Access to lethal items such  as Rx pills What has been your use of drugs/alcohol within the last 12 months?: Cannabis Previous Attempts/Gestures: No How many times?: 0 Other Self Harm Risks: None Triggers for Past  Attempts: None known Intentional Self Injurious Behavior: None Family Suicide History: No Recent stressful life event(s): Other (Comment)(Social Stressors) Persecutory voices/beliefs?: Yes Depression: Yes Depression Symptoms: Isolating Substance abuse history and/or treatment for substance abuse?: Yes Suicide prevention information given to non-admitted patients: Not applicable  Risk to Others within the past 6 months Homicidal Ideation: No Does patient have any lifetime risk of violence toward others beyond the six months prior to admission? : No Thoughts of Harm to Others: No Current Homicidal Intent: No Current Homicidal Plan: No Access to Homicidal Means: No Identified Victim: None Reported History of harm to others?: No Assessment of Violence: None Noted Violent Behavior Description: None Reported Does patient have access to weapons?: No Criminal Charges Pending?: No Does patient have a court date: Yes Court Date: 02/15/19 Is patient on probation?: Unknown  Psychosis Hallucinations: Auditory, Visual, With command("something was telling me to do it") Delusions: Persecutory  Mental Status Report Appearance/Hygiene: In scrubs Eye Contact: Good Motor Activity: Freedom of movement Speech: Logical/coherent Level of Consciousness: Alert Mood: Depressed, Anxious, Suspicious Affect: Anxious, Depressed Anxiety Level: Severe Thought Processes: Coherent, Relevant Judgement: Unimpaired Orientation: Person, Place, Time, Situation, Appropriate for developmental age Obsessive Compulsive Thoughts/Behaviors: Minimal  Cognitive Functioning Concentration: Good Memory: Recent Intact, Remote Intact Is patient IDD: No Insight: Fair Impulse Control: Fair Appetite: Fair Have you had any weight changes? : Loss Amount of the weight change? (lbs): ([facility]) Sleep: Decreased Total Hours of Sleep: 0(Pt has not slept in the last 3 days) Vegetative Symptoms: None  ADLScreening [facility]  Assessment Services) Patient's cognitive ability adequate to safely complete daily activities?: Yes Patient able to express need for assistance with ADLs?: Yes Independently performs ADLs?: Yes (appropriate for developmental age)  Prior Inpatient Therapy Prior Inpatient Therapy: No  Prior Outpatient Therapy Prior Outpatient Therapy: No Does patient have an ACCT team?: No Does patient have Intensive In-House Services?  : No Does patient have [facility] services? : No Does patient have P4CC services?: No  ADL Screening (condition at time of admission) Patient's cognitive ability adequate to safely complete daily activities?: Yes Patient able to express need for assistance with ADLs?: Yes Independently performs ADLs?: Yes (appropriate for developmental age)       Abuse/Neglect Assessment (Assessment to be complete while patient is alone) Abuse/Neglect Assessment Can Be Completed: Yes Physical Abuse: Denies Verbal Abuse: Denies Sexual Abuse: Denies Exploitation of patient/patient's resources: Denies Self-Neglect: Denies Values / Beliefs Cultural Requests During Hospitalization: None Spiritual Requests During Hospitalization: None Consults Spiritual Care Consult Needed: No Social Work Consult Needed: No Merchant navy officer (For Healthcare) Does Patient Have a Medical Advance Directive?: No       Child/Adolescent Assessment Running Away Risk: (Patient is an adult)  Disposition:  Disposition Initial Assessment Completed for this Encounter: Yes Disposition of Patient: Admit Type of inpatient treatment program: Adult Patient refused recommended treatment: No Mode of transportation if patient is discharged/movement?: N/A Patient referred to: Other (Comment)([facility])  On Site Evaluation by:   Reviewed with Physician:    Wilmon Arms 12/14/2018 2:53 PM

## 2018-12-14 NOTE — ED Notes (Signed)
Patient talking to wife Bossie Giambra

## 2018-12-14 NOTE — ED Notes (Signed)
Pt very anxious.  Keeps stating he is scared. States he feels anxious and like hes going to pass out.  Pt given sandwich tray and milk.  Pt aware that he needs to give [place] a urine sample

## 2018-12-15 DIAGNOSIS — Z5181 Encounter for therapeutic drug level monitoring: Secondary | ICD-10-CM

## 2018-12-15 DIAGNOSIS — F410 Panic disorder [episodic paroxysmal anxiety] without agoraphobia: Principal | ICD-10-CM

## 2018-12-15 LAB — URINE DRUG SCREEN, QUALITATIVE (ARMC ONLY)
Amphetamines, Ur Screen: NOT DETECTED
Barbiturates, Ur Screen: NOT DETECTED
Benzodiazepine, Ur Scrn: NOT DETECTED
Cannabinoid 50 Ng, Ur ~~LOC~~: POSITIVE — AB
Cocaine Metabolite,Ur ~~LOC~~: NOT DETECTED
MDMA (Ecstasy)Ur Screen: NOT DETECTED
Methadone Scn, Ur: NOT DETECTED
Opiate, Ur Screen: NOT DETECTED
Phencyclidine (PCP) Ur S: NOT DETECTED
Tricyclic, Ur Screen: NOT DETECTED

## 2018-12-15 LAB — HEMOGLOBIN A1C
Hgb A1c MFr Bld: 5.5 % (ref 4.8–5.6)
Mean Plasma Glucose: 111.15 mg/dL

## 2018-12-15 MED ORDER — PAROXETINE HCL 20 MG PO TABS
20.0000 mg | ORAL_TABLET | Freq: Every day | ORAL | Status: DC
  Administered 2018-12-16 – 2018-12-17 (×2): 20 mg via ORAL
  Filled 2018-12-15 (×3): qty 1

## 2018-12-15 MED ORDER — QUETIAPINE FUMARATE 100 MG PO TABS
100.0000 mg | ORAL_TABLET | Freq: Every day | ORAL | Status: DC
  Administered 2018-12-15: 100 mg via ORAL
  Filled 2018-12-15: qty 1

## 2018-12-15 NOTE — [facility] Group Notes (Signed)

## 2018-12-15 NOTE — H&P (Signed)
Psychiatric Admission Assessment Adult  Patient Identification: Joshua Shaffer. MRN:  [id number] Date of Evaluation:  12/15/2018 Chief Complaint:  Acute Psychosis Principal Diagnosis: Panic attacks Diagnosis:  Principal Problem:   Panic attacks Active Problems:   Cannabis abuse   Acute psychosis ([place])  History of Present Illness: Patient seen chart reviewed.  Patient with minimal past psychiatric history presented with complaints of a week to 2 weeks of anxiety symptoms accompanied by psychotic symptoms.  He complains that at nighttime when he tries to go to bed his heart will start racing and he will become short of breath.  He will be overwhelmed with the believe that he is going to die.  At times he will have auditory hallucinations hearing voices telling him that he is going to die.  As time has gone on these panic attack-like symptoms of happen more frequently in the day as well although he denies having hallucinations during the day.  Of some concern he is also endorsing having ideas of reference believing that the television set is speaking to him and being frightened to stand near a television.  Patient has gradually reduced any activity going outside of the house and become more frightened.  He denies any homicidal ideation.  Admits to some suicidal thoughts but without any intent or plan of acting on them.  Admits to regular use of marijuana probably a couple of hits a day.  Denies other drug use.  Feels like he has had a lot of stress in his life being out of work and now with the extra stress of the current virus situation.  Not currently receiving any psychiatric treatment. Associated Signs/Symptoms: Depression Symptoms:  depressed mood, insomnia, difficulty concentrating, hopelessness, suicidal thoughts without plan, anxiety, panic attacks, (Hypo) Manic Symptoms:  None reported Anxiety Symptoms:  Panic Symptoms, Psychotic Symptoms:  Hallucinations: Auditory Ideas of  Reference, PTSD Symptoms: Negative Total Time spent with patient: 1 hour  Past Psychiatric History: Patient says he has long felt that he had anxiety and depression but had never seen a psychiatrist or therapist.  Never been on any psychiatric medicine in the past.  No history of suicide attempts or violence.  Is the patient at risk to self? Yes.    Has the patient been a risk to self in the past 6 months? No.  Has the patient been a risk to self within the distant past? No.  Is the patient a risk to others? No.  Has the patient been a risk to others in the past 6 months? No.  Has the patient been a risk to others within the distant past? No.   Prior Inpatient Therapy:   Prior Outpatient Therapy:    Alcohol Screening: 1. How often do you have a drink containing alcohol?: Never 2. How many drinks containing alcohol do you have on a typical day when you are drinking?: 1 or 2 3. How often do you have six or more drinks on one occasion?: Never AUDIT-C Score: 0 4. How often during the last year have you found that you were not able to stop drinking once you had started?: Never 5. How often during the last year have you failed to do what was normally expected from you becasue of drinking?: Never 6. How often during the last year have you needed a first drink in the morning to get yourself going after a heavy drinking session?: Never 7. How often during the last year have you had a feeling of guilt  of remorse after drinking?: Never 8. How often during the last year have you been unable to remember what happened the night before because you had been drinking?: Never 9. Have you or someone else been injured as a result of your drinking?: No 10. Has a relative or friend or a doctor or another health worker been concerned about your drinking or suggested you cut down?: No Alcohol Use Disorder Identification Test Final Score (AUDIT): 0 Alcohol Brief Interventions/Follow-up: AUDIT Score <7 follow-up  not indicated, Alcohol Education Substance Abuse History in the last 12 months:  Yes.   Consequences of Substance Abuse: Medical Consequences:  Unclear, but may be contributing to current symptoms Previous Psychotropic Medications: No  Psychological Evaluations: No  Past Medical History:  Past Medical History:  Diagnosis Date  . Dizziness     Past Surgical History:  Procedure Laterality Date  . TONSILLECTOMY     Family History:  Family History  Problem Relation Age of Onset  . Lung cancer Maternal Grandfather    Family Psychiatric  History: None reported Tobacco Screening: Have you used any form of tobacco in the last 30 days? (Cigarettes, Smokeless Tobacco, Cigars, and/or Pipes): Yes Tobacco use, Select all that apply: 5 or more cigarettes per day Are you interested in Tobacco Cessation Medications?: No, patient refused Counseled patient on smoking cessation including recognizing danger situations, developing coping skills and basic information about quitting provided: Refused/Declined practical counseling Social History:  Social History   Substance and Sexual Activity  Alcohol Use No   Comment: rarely     Social History   Substance and Sexual Activity  Drug Use No    Additional Social History: Marital status: Married Number of Years Married: 24 Are you sexually active?: Yes What is your sexual orientation?: heterosexual Does patient have children?: Yes How many children?: 3 How is patient's relationship with their children?: 14yo, 13yo, and 10yo, pt reports a good relationship with his kids                         Allergies:  No Known Allergies Lab Results:  Results for orders placed or performed during the hospital encounter of 12/14/18 (from the past 48 hour(s))  CBC     Status: Abnormal   Collection Time: 12/14/18  5:02 AM  Result Value Ref Range   WBC 14.2 (H) 4.0 - 10.5 K/uL   RBC 4.96 4.22 - 5.81 MIL/uL   Hemoglobin 15.0 13.0 - 17.0 g/dL   HCT  44.3 39.0 - 52.0 %   MCV 89.3 80.0 - 100.0 fL   MCH 30.2 26.0 - 34.0 pg   MCHC 33.9 30.0 - 36.0 g/dL   RDW 13.2 11.5 - 15.5 %   Platelets 321 150 - 400 K/uL   nRBC 0.0 0.0 - 0.2 %    Comment: Performed at [facility], [address] [place], [place] [address]  Comprehensive metabolic panel     Status: Abnormal   Collection Time: 12/14/18  5:02 AM  Result Value Ref Range   Sodium 140 135 - 145 mmol/L   Potassium 3.4 (L) 3.5 - 5.1 mmol/L   Chloride 105 98 - 111 mmol/L   CO2 26 22 - 32 mmol/L   Glucose, Bld 105 (H) 70 - 99 mg/dL   BUN 6 6 - 20 mg/dL   Creatinine, Ser 0.96 0.61 - 1.24 mg/dL   Calcium 9.2 8.9 - 10.3 mg/dL   Total Protein 8.3 (H) 6.5 -  8.1 g/dL   Albumin 4.4 3.5 - 5.0 g/dL   AST 17 15 - 41 U/L   ALT 16 0 - 44 U/L   Alkaline Phosphatase 80 38 - 126 U/L   Total Bilirubin 0.6 0.3 - 1.2 mg/dL   GFR calc non Af Amer >60 >60 mL/min   GFR calc Af Amer >60 >60 mL/min   Anion gap 9 5 - 15    Comment: Performed at [facility], [address] [place], [place] [address]  TSH     Status: None   Collection Time: 12/14/18  5:02 AM  Result Value Ref Range   TSH 1.665 0.350 - 4.500 uIU/mL    Comment: Performed by a 3rd Generation assay with a functional sensitivity of <=0.01 uIU/mL. Performed at [facility], [address] [place], [place] [address]   Ethanol     Status: None   Collection Time: 12/14/18  5:02 AM  Result Value Ref Range   Alcohol, Ethyl (B) <10 <10 mg/dL    Comment: (NOTE) Lowest detectable limit for serum alcohol is 10 mg/dL. For medical purposes only. Performed at [facility], [address] [place], [place] [address]   Lipid panel     Status: None   Collection Time: 12/14/18  5:02 AM  Result Value Ref Range   Cholesterol 91 0 - 200 mg/dL   Triglycerides 92 <150 mg/dL   HDL 49 >40 mg/dL   Total CHOL/HDL Ratio 1.9 RATIO   VLDL 18 0 - 40 mg/dL   LDL Cholesterol 24 0 - 99 mg/dL    Comment:        Total  Cholesterol/HDL:CHD Risk Coronary Heart Disease Risk Table                     Men   Women  1/2 Average Risk   3.4   3.3  Average Risk       5.0   4.4  2 X Average Risk   9.6   7.1  3 X Average Risk  23.4   11.0        Use the calculated Patient Ratio above and the CHD Risk Table to determine the patient's CHD Risk.        ATP III CLASSIFICATION (LDL):  <100     mg/dL   Optimal  100-129  mg/dL   Near or Above                    Optimal  130-159  mg/dL   Borderline  160-189  mg/dL   High  >190     mg/dL   Very High Performed at [facility], [address] [place], [place] [address]   Hemoglobin A1c     Status: None   Collection Time: 12/14/18  5:02 AM  Result Value Ref Range   Hgb A1c MFr Bld 5.5 4.8 - 5.6 %    Comment: (NOTE) Pre diabetes:          5.7%-6.4% Diabetes:              >6.4% Glycemic control for   <7.0% adults with diabetes    Mean Plasma Glucose 111.15 mg/dL    Comment: Performed at [facility] [address], [place], [place] [address]    Blood Alcohol level:  Lab Results  Component Value Date   [facility] <10 38/25/0539    Metabolic Disorder Labs:  Lab Results  Component  Value Date   HGBA1C 5.5 12/14/2018   MPG 111.15 12/14/2018   No results found for: PROLACTIN Lab Results  Component Value Date   CHOL 91 12/14/2018   TRIG 92 12/14/2018   HDL 49 12/14/2018   CHOLHDL 1.9 12/14/2018   VLDL 18 12/14/2018   LDLCALC 24 12/14/2018    Current Medications: Current Facility-Administered Medications  Medication Dose Route Frequency Provider Last Rate Last Dose  . acetaminophen (TYLENOL) tablet 650 mg  650 mg Oral Q6H PRN Lavella Hammock, MD      . alum & mag hydroxide-simeth (MAALOX/MYLANTA) 200-200-20 MG/5ML suspension 30 mL  30 mL Oral Q4H PRN Lavella Hammock, MD      . diphenhydrAMINE (BENADRYL) capsule 50 mg  50 mg Oral QHS PRN Lavella Hammock, MD      . hydrOXYzine (ATARAX/VISTARIL) tablet 25 mg  25 mg Oral Q6H PRN Lavella Hammock, MD      . risperiDONE (RISPERDAL M-TABS) disintegrating tablet 2 mg  2 mg Oral Q8H PRN Lavella Hammock, MD       And  . LORazepam (ATIVAN) tablet 1 mg  1 mg Oral PRN Lavella Hammock, MD       And  . ziprasidone (GEODON) injection 20 mg  20 mg Intramuscular PRN Lavella Hammock, MD      . magnesium hydroxide (MILK OF MAGNESIA) suspension 30 mL  30 mL Oral Daily PRN Lavella Hammock, MD      . PARoxetine (PAXIL) tablet 20 mg  20 mg Oral Daily Clapacs, John T, MD      . QUEtiapine (SEROQUEL) tablet 100 mg  100 mg Oral QHS Clapacs, John T, MD      . risperiDONE (RISPERDAL) tablet 2 mg  2 mg Oral QHS Lavella Hammock, MD   2 mg at 12/14/18 2123   PTA Medications: Medications Prior to Admission  Medication Sig Dispense Refill Last Dose  . busPIRone (BUSPAR) 5 MG tablet Take 1 tablet (5 mg total) by mouth 3 (three) times daily. START for first week with 5 mg twice daily.  Then increase to three times daily and continue. 90 tablet 1 12/13/2018 at 2100  . hydrOXYzine (ATARAX/VISTARIL) 10 MG tablet Take 1-2 tablets (10-20 mg total) by mouth 3 (three) times daily as needed for anxiety (sleep). 60 tablet 2 12/13/2018 at 2100    Musculoskeletal: Strength & Muscle Tone: within normal limits Gait & Station: normal Patient leans: N/A  Psychiatric Specialty Exam: Physical Exam  Nursing note and vitals reviewed. Constitutional: He appears well-developed and well-nourished.  HENT:  Head: Normocephalic and atraumatic.  Eyes: Pupils are equal, round, and reactive to light. Conjunctivae are normal.  Neck: Normal range of motion.  Cardiovascular: Regular rhythm and normal heart sounds.  Respiratory: Effort normal. No respiratory distress.  GI: Soft.  Musculoskeletal: Normal range of motion.  Neurological: He is alert.  Skin: Skin is warm and dry.  Psychiatric: Judgment normal. His mood appears anxious. His speech is tangential. He is agitated and slowed. Thought content is paranoid. Cognition  and memory are normal. He does not express inappropriate judgment. He expresses suicidal ideation. He expresses no suicidal plans.    Review of Systems  Constitutional: Negative.   HENT: Negative.   Eyes: Negative.   Respiratory: Negative.   Cardiovascular: Negative.   Gastrointestinal: Negative.   Musculoskeletal: Negative.   Skin: Negative.   Neurological: Negative.   Psychiatric/Behavioral: Positive for depression, hallucinations, substance abuse and suicidal ideas.  The patient is nervous/anxious and has insomnia.     Blood pressure 124/88, pulse 62, temperature 98.8 F (37.1 C), temperature source Oral, resp. rate 18, height 6' (1.829 m), weight 60.3 kg, SpO2 100 %.Body mass index is 18.04 kg/m.  General Appearance: Casual and Fairly Groomed  Eye Contact:  Fair  Speech:  Slow  Volume:  Decreased  Mood:  Anxious and Depressed  Affect:  Labile  Thought Process:  Coherent  Orientation:  Full (Time, Place, and Person)  Thought Content:  Hallucinations: Auditory and Ideas of Reference:   Paranoia  Suicidal Thoughts:  Yes.  without intent/plan  Homicidal Thoughts:  No  Memory:  Immediate;   Fair Recent;   Fair Remote;   Fair  Judgement:  Fair  Insight:  Fair  Psychomotor Activity:  Decreased  Concentration:  Concentration: Fair  Recall:  [employer] of Knowledge:  Fair  Language:  Fair  Akathisia:  No  Handed:  Right  AIMS (if indicated):     Assets:  Desire for Improvement Housing Physical Health Resilience  ADL's:  Intact  Cognition:  Impaired,  Mild  Sleep:  Number of Hours: 7.75    Treatment Plan Summary: Daily contact with patient to assess and evaluate symptoms and progress in treatment, Medication management and Plan Diagnosis a little uncertain.  Much of his symptomatology is consistent with panic disorder although he is also reporting the psychotic symptoms.  The auditory hallucinations could be consistent with panic attacks but the ideas of reference are a  little unusual.  I am starting him on Paxil 20 mg a day for anxiety attacks and starting him on Risperdal at nighttime and as needed Seroquel for anxiety during the day.  Patient will be engaged in groups and activities.  Met with treatment team today.  Continually reassess suicidality before arranging for discharge.  He has already met with a representative from [place].  Observation Level/Precautions:  15 minute checks  Laboratory:  UDS  Psychotherapy:    Medications:    Consultations:    Discharge Concerns:    Estimated LOS:  Other:     Physician Treatment Plan for Primary Diagnosis: Panic attacks Long Term Goal(s): Improvement in symptoms so as ready for discharge  Short Term Goals: Ability to disclose and discuss suicidal ideas and Ability to demonstrate self-control will improve  Physician Treatment Plan for Secondary Diagnosis: Principal Problem:   Panic attacks Active Problems:   Cannabis abuse   Acute psychosis ([place])  Long Term Goal(s): Improvement in symptoms so as ready for discharge  Short Term Goals: Ability to identify and develop effective coping behaviors will improve and Ability to maintain clinical measurements within normal limits will improve  I certify that inpatient services furnished can reasonably be expected to improve the patient's condition.    Alethia Berthold, MD 4/9/20205:59 PM

## 2018-12-15 NOTE — Plan of Care (Signed)
Patient is alert and oriented to place, and time. Patient denies SI and HI. Patient complains of having insomnia, " Every time I lay back in the bed I feel like I can't breathe or something." Patient still feels as though he continues to hear some voices. Patient is very pleasant and cooperative, takes medications appropriately. Patient will be encouraged to attend and participate in groups. No self harming behavior observed as of yet. 15 minute safety checks to continue. Problem: Education: Goal: Knowledge of [place] [facility] Education information/materials will improve Outcome: Not Progressing Goal: Emotional status will improve Outcome: Not Progressing Goal: Mental status will improve Outcome: Not Progressing Goal: Verbalization of understanding the information provided will improve Outcome: Not Progressing   Problem: Self-Concept: Goal: Ability to identify factors that promote anxiety will improve Outcome: Not Progressing Goal: Level of anxiety will decrease Outcome: Not Progressing Goal: Ability to modify response to factors that promote anxiety will improve Outcome: Not Progressing

## 2018-12-15 NOTE — Progress Notes (Signed)
Recreation Therapy Notes  INPATIENT RECREATION THERAPY ASSESSMENT  Patient Details Name: Joshua Shaffer. MRN: [id number] DOB: Aug 14, 1986 Today's Date: 12/15/2018       Information Obtained From: Patient  Able to Participate in Assessment/Interview: Yes  Patient Presentation: Responsive  Reason for Admission (Per Patient): Active Symptoms  Patient Stressors: Relationship  Coping Skills:   Isolation, Avoidance  Leisure Interests (2+):  Exercise - Walking, Social - Family, Music - Listen  Frequency of Recreation/Participation: Monthly  Awareness of Community Resources:     Community Resources:     Current Use:    If no, Barriers?:    Expressed Interest in [employer] Information:    [place] of Residence:  Film/video editor  Patient Main Form of Transportation: Walk  Patient Strengths:  Doing the right thing  Patient Identified Areas of Improvement:  My anxiety  Patient Goal for Hospitalization:  To find the right medication and know what is going on with my body  Current SI (including self-harm):  No  Current HI:  No  Current AVH: No  Staff Intervention Plan: Group Attendance, Collaborate with Interdisciplinary Treatment Team  Consent to Intern Participation: N/A  Mujahid Jalomo 12/15/2018, 4:10 PM

## 2018-12-15 NOTE — [facility] Suicide Risk Assessment (Signed)
[facility]   Nursing information obtained from:  Patient Demographic factors:  Male Current Mental Status:  NA Loss Factors:  NA Historical Factors:  NA Risk Reduction Factors:  Responsible for children under 32 years of age, Living with another person, especially a relative, Positive social support, Positive coping skills or problem solving skills  Total Time spent with patient: 1 hour Principal Problem: Panic attacks Diagnosis:  Principal Problem:   Panic attacks Active Problems:   Cannabis abuse   Acute psychosis (HCC)  Subjective Data: Patient seen chart reviewed.  Patient presented with anxiety attacks and psychosis.  Denies any suicidal plan although he admits that he has had some suicidal thoughts at times particularly when he is extremely distressed by his symptoms.  Denies any intent or plan to harm himself at this point in the hospital.  Continued Clinical Symptoms:  Alcohol Use Disorder Identification Test Final Score (AUDIT): 0 The "Alcohol Use Disorders Identification Test", Guidelines for Use in Primary Care, Second Edition.  World Science writerHealth Organization [facility](WHO). Score between 0-7:  no or low risk or alcohol related problems. Score between 8-15:  moderate risk of alcohol related problems. Score between 16-19:  high risk of alcohol related problems. Score 20 or above:  warrants further diagnostic evaluation for alcohol dependence and treatment.   CLINICAL FACTORS:   Panic Attacks Currently Psychotic   Musculoskeletal: Strength & Muscle Tone: within normal limits Gait & Station: normal Patient leans: N/A  Psychiatric Specialty Exam: Physical Exam  Nursing note and vitals reviewed. Constitutional: He appears well-developed and well-nourished.  HENT:  Head: Normocephalic and atraumatic.  Eyes: Pupils are equal, round, and reactive to light. Conjunctivae are normal.  Neck: Normal range of motion.  Cardiovascular: Regular rhythm and normal  heart sounds.  Respiratory: Effort normal. No respiratory distress.  GI: Soft.  Musculoskeletal: Normal range of motion.  Neurological: He is alert.  Skin: Skin is warm and dry.  Psychiatric: His mood appears anxious. His speech is tangential. He is agitated. He is not aggressive. Thought content is paranoid and delusional. Cognition and memory are normal. Cognition and memory are not impaired. He expresses impulsivity.    Review of Systems  Constitutional: Negative.   HENT: Negative.   Eyes: Negative.   Respiratory: Negative.   Cardiovascular: Negative.   Gastrointestinal: Negative.   Musculoskeletal: Negative.   Skin: Negative.   Neurological: Negative.   Psychiatric/Behavioral: Positive for depression, hallucinations, substance abuse and suicidal ideas. The patient is nervous/anxious and has insomnia.     Blood pressure 124/88, pulse 62, temperature 98.8 F (37.1 C), temperature source Oral, resp. rate 18, height 6' (1.829 m), weight 60.3 kg, SpO2 100 %.Body mass index is 18.04 kg/m.  General Appearance: Fairly Groomed  Eye Contact:  Fair  Speech:  Slow  Volume:  Increased  Mood:  Anxious  Affect:  Depressed  Thought Process:  Disorganized  Orientation:  Full (Time, Place, and Person)  Thought Content:  Rumination and Tangential  Suicidal Thoughts:  Yes.  without intent/plan  Homicidal Thoughts:  No  Memory:  Immediate;   Fair Recent;   Fair Remote;   Fair  Judgement:  Impaired  Insight:  Shallow  Psychomotor Activity:  Decreased  Concentration:  Concentration: Fair  Recall:  FiservFair  Fund of Knowledge:  Fair  Language:  Fair  Akathisia:  No  Handed:  Right  AIMS (if indicated):     Assets:  Communication Skills Desire for Improvement Housing Physical Health Resilience  ADL's:  Intact  Cognition:  WNL  Sleep:  Number of Hours: 7.75      COGNITIVE FEATURES THAT CONTRIBUTE TO RISK:  Polarized thinking    SUICIDE RISK:   Mild:  Suicidal ideation of limited  frequency, intensity, duration, and specificity.  There are no identifiable plans, no associated intent, mild dysphoria and related symptoms, good self-control (both objective and subjective assessment), few other risk factors, and identifiable protective factors, including available and accessible social support.  PLAN OF CARE: Patient is denying any intent or plan but has had some mild suicidal thoughts.  Remains very anxious and at times hopeless.  Cooperative with treatment currently.  Not aggressive.  Continue monitoring and reassess suicidality before discharge.  I certify that inpatient services furnished can reasonably be expected to improve the patient's condition.   Mordecai Rasmussen, MD 12/15/2018, 5:55 PM
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
## 2018-12-15 NOTE — BHH Suicide Risk Assessment (Signed)
BHH INPATIENT:  Family/Significant Other Suicide Prevention Education  Suicide Prevention Education:  Patient Refusal for Family/Significant Other Suicide Prevention Education: The patient Joshua Shaffer. has refused to provide written consent for family/significant other to be provided Family/Significant Other Suicide Prevention Education during admission and/or prior to discharge.  Physician notified.  SPE completed with pt, as pt refused to consent to family contact. SPI pamphlet provided to pt and pt was encouraged to share information with support network, ask questions, and talk about any concerns relating to SPE. Pt denies access to guns/firearms and verbalized understanding of information provided. Mobile Crisis information also provided to pt.   Charlann Lange Stacia Feazell MSW LCSW 12/15/2018, 11:06 AM

## 2018-12-15 NOTE — Plan of Care (Signed)
Patient is aware of positive coping skills and able to voice and identify positive attributes of self, voice no concerns, mood is good and affect is appropriate to circumstances , patient is compliant with medications and therapeutic regimen, denies any suicide, homicidal ideations , denies hallucination and delusions, contract for safety, education and support is provided, monitored every 15 minutes for safety no distress noted.   Problem: Education: Goal: Knowledge of [place] [facility] Education information/materials will improve Outcome: Progressing Goal: Emotional status will improve Outcome: Progressing Goal: Mental status will improve Outcome: Progressing Goal: Verbalization of understanding the information provided will improve Outcome: Progressing   Problem: Self-Concept: Goal: Ability to identify factors that promote anxiety will improve Outcome: Progressing Goal: Level of anxiety will decrease Outcome: Progressing Goal: Ability to modify response to factors that promote anxiety will improve Outcome: Progressing

## 2018-12-15 NOTE — Tx Team (Addendum)
Interdisciplinary Treatment and Diagnostic Plan Update  12/15/2018 Time of Session: 330PM [name]. MRN: [id number]  Principal Diagnosis: <principal problem not specified>  Secondary Diagnoses: Active Problems:   Acute psychosis ([place])   Current Medications:  Current Facility-Administered Medications  Medication Dose Route Frequency Provider Last Rate Last Dose  . acetaminophen (TYLENOL) tablet 650 mg  650 mg Oral Q6H PRN Lavella Hammock, MD      . alum & mag hydroxide-simeth (MAALOX/MYLANTA) 200-200-20 MG/5ML suspension 30 mL  30 mL Oral Q4H PRN Lavella Hammock, MD      . busPIRone (BUSPAR) tablet 10 mg  10 mg Oral BID Lavella Hammock, MD   10 mg at 12/15/18 3790  . diphenhydrAMINE (BENADRYL) capsule 50 mg  50 mg Oral QHS PRN Lavella Hammock, MD      . hydrOXYzine (ATARAX/VISTARIL) tablet 25 mg  25 mg Oral Q6H PRN Lavella Hammock, MD      . risperiDONE (RISPERDAL M-TABS) disintegrating tablet 2 mg  2 mg Oral Q8H PRN Lavella Hammock, MD       And  . LORazepam (ATIVAN) tablet 1 mg  1 mg Oral PRN Lavella Hammock, MD       And  . ziprasidone (GEODON) injection 20 mg  20 mg Intramuscular PRN Lavella Hammock, MD      . magnesium hydroxide (MILK OF MAGNESIA) suspension 30 mL  30 mL Oral Daily PRN Lavella Hammock, MD      . PARoxetine (PAXIL) tablet 20 mg  20 mg Oral Daily Clapacs, John T, MD      . QUEtiapine (SEROQUEL) tablet 100 mg  100 mg Oral QHS Clapacs, John T, MD      . risperiDONE (RISPERDAL) tablet 2 mg  2 mg Oral QHS Lavella Hammock, MD   2 mg at 12/14/18 2123   PTA Medications: Medications Prior to Admission  Medication Sig Dispense Refill Last Dose  . busPIRone (BUSPAR) 5 MG tablet Take 1 tablet (5 mg total) by mouth 3 (three) times daily. START for first week with 5 mg twice daily.  Then increase to three times daily and continue. 90 tablet 1 12/13/2018 at 2100  . hydrOXYzine (ATARAX/VISTARIL) 10 MG tablet Take 1-2 tablets (10-20 mg total) by mouth 3 (three)  times daily as needed for anxiety (sleep). 60 tablet 2 12/13/2018 at 2100    Patient Stressors: Medication change or noncompliance Other: auditory hallucinations  Patient Strengths: Ability for insight Capable of independent living Communication skills Motivation for treatment/growth  Treatment Modalities: Medication Management, Group therapy, Case management,  1 to 1 session with clinician, Psychoeducation, Recreational therapy.   Physician Treatment Plan for Primary Diagnosis: <principal problem not specified> Long Term Goal(s):     Short Term Goals:    Medication Management: Evaluate patient's response, side effects, and tolerance of medication regimen.  Therapeutic Interventions: 1 to 1 sessions, Unit Group sessions and Medication administration.  Evaluation of Outcomes: Not Met  Physician Treatment Plan for Secondary Diagnosis: Active Problems:   Acute psychosis ([place])  Long Term Goal(s):     Short Term Goals:       Medication Management: Evaluate patient's response, side effects, and tolerance of medication regimen.  Therapeutic Interventions: 1 to 1 sessions, Unit Group sessions and Medication administration.  Evaluation of Outcomes: Not Met   RN Treatment Plan for Primary Diagnosis: <principal problem not specified> Long Term Goal(s): Knowledge of disease and therapeutic regimen to maintain health will improve  Short Term Goals: Ability to demonstrate self-control, Ability to participate in decision making will improve, Ability to verbalize feelings will improve and Ability to identify and develop effective coping behaviors will improve  Medication Management: RN will administer medications as ordered by provider, will assess and evaluate patient's response and provide education to patient for prescribed medication. RN will report any adverse and/or side effects to prescribing provider.  Therapeutic Interventions: 1 on 1 counseling sessions, Psychoeducation,  Medication administration, Evaluate responses to treatment, Monitor vital signs and CBGs as ordered, Perform/monitor CIWA, COWS, AIMS and Fall Risk screenings as ordered, Perform wound care treatments as ordered.  Evaluation of Outcomes: Not Met   LCSW Treatment Plan for Primary Diagnosis: <principal problem not specified> Long Term Goal(s): Safe transition to appropriate next level of care at discharge, Engage patient in therapeutic group addressing interpersonal concerns.  Short Term Goals: Engage patient in aftercare planning with referrals and resources, Increase social support, Increase emotional regulation, Facilitate acceptance of mental health diagnosis and concerns and Increase skills for wellness and recovery  Therapeutic Interventions: Assess for all discharge needs, 1 to 1 time with Social worker, Explore available resources and support systems, Assess for adequacy in community support network, Educate family and significant other(s) on suicide prevention, Complete Psychosocial Assessment, Interpersonal group therapy.  Evaluation of Outcomes: Not Met   Progress in Treatment: Attending groups: No. Participating in groups: No. Taking medication as prescribed: Yes. Toleration medication: Yes. Family/Significant other contact made: No, will contact:  Pt declined collateral contact Patient understands diagnosis: Yes. Discussing patient identified problems/goals with staff: Yes. Medical problems stabilized or resolved: Yes. Denies suicidal/homicidal ideation: Yes. Issues/concerns per patient self-inventory: No. Other: n/a  New problem(s) identified: No, Describe:  none  New Short Term/Long Term Goal(s): medication management for mood stabilization; development of comprehensive mental wellness/sobriety plan.   Patient Goals:  "I just really want to figure out whats going on with my body and find the right medication to take"  Discharge Plan or Barriers: SPE pamphlet, Mobile  Crisis information, and AA/NA information provided to patient for additional community support and resources. Pt is agreeable to follow up with [facility] in [place], [place].  Reason for Continuation of Hospitalization: Hallucinations Mania Medication stabilization  Estimated Length of Stay: 5-7 days  Recreational Therapy: Patient Stressors: N/A  Patient Goal: Patient will engage in groups without prompting or encouragement from LRT x3 group sessions within 5 recreation therapy group sessions  Attendees: Patient: Joshua Shaffer 12/15/2018 4:05 PM  Physician: Dr Weber Cooks MD 12/15/2018 4:05 PM  Nursing:  12/15/2018 4:05 PM  RN Care Manager: 12/15/2018 4:05 PM  Social Worker: Minette Brine Moton LCSW 12/15/2018 4:05 PM  Recreational Therapist: Roanna Epley CTRS LRT 12/15/2018 4:05 PM  Other: Sanjuana Kava LCSW 12/15/2018 4:05 PM  Other:  12/15/2018 4:05 PM  Other: 12/15/2018 4:05 PM    Scribe for Treatment Team: Mariann Laster Moton, LCSW 12/15/2018 4:05 PM

## 2018-12-15 NOTE — Progress Notes (Signed)
Recreation Therapy Notes  Date: 12/15/2018  Time: 9:30 am   Location: Craft room   Behavioral response: N/A   Intervention Topic: Creative Expressions  Discussion/Intervention: Patient did not attend group.   Clinical Observations/Feedback:  Patient did not attend group.   Endia Moncur LRT/CTRS         Joshua Shaffer 12/15/2018 11:10 AM 

## 2018-12-16 MED ORDER — PAROXETINE HCL 20 MG PO TABS: 20.0000 mg | ORAL_TABLET | Freq: Every day | ORAL | 1 refills | Status: DC

## 2018-12-16 MED ORDER — ADULT MULTIVITAMIN W/MINERALS CH
1.0000 | ORAL_TABLET | Freq: Every day | ORAL | Status: DC
  Administered 2018-12-17: 1 via ORAL
  Filled 2018-12-16: qty 1

## 2018-12-16 MED ORDER — QUETIAPINE FUMARATE 300 MG PO TABS: 300.0000 mg | ORAL_TABLET | Freq: Every day | ORAL | 1 refills | Status: DC

## 2018-12-16 MED ORDER — ENSURE ENLIVE PO LIQD
237.0000 mL | Freq: Two times a day (BID) | ORAL | Status: DC
  Administered 2018-12-16 – 2018-12-17 (×2): 237 mL via ORAL

## 2018-12-16 MED ORDER — QUETIAPINE FUMARATE 200 MG PO TABS: 200.0000 mg | ORAL_TABLET | Freq: Every day | ORAL | Status: DC

## 2018-12-16 MED ORDER — PAROXETINE HCL 20 MG PO TABS: 20.0000 mg | ORAL_TABLET | Freq: Every day | ORAL | 0 refills | Status: DC

## 2018-12-16 MED ORDER — QUETIAPINE FUMARATE 300 MG PO TABS: 300.0000 mg | ORAL_TABLET | Freq: Every day | ORAL | 0 refills | Status: DC

## 2018-12-16 MED ORDER — QUETIAPINE FUMARATE 200 MG PO TABS
400.0000 mg | ORAL_TABLET | Freq: Every day | ORAL | Status: DC
  Administered 2018-12-16: 400 mg via ORAL
  Filled 2018-12-16: qty 2

## 2018-12-16 NOTE — Plan of Care (Signed)
Patient is adjusting well in the unit, seeing patient in milieu area with minimal interaction with peers, patent stating that he hears some voices when he is alone but did elaborate what any further, patient is compliant with his medications and without any side effects , patient denies SI/HI, and verbally contract for safety , and sleep is continuous only requiring 15 minutes safety checks no distress.   Problem: Education: Goal: Knowledge of [place] [facility] Education information/materials will improve Outcome: Progressing Goal: Emotional status will improve Outcome: Progressing Goal: Mental status will improve Outcome: Progressing Goal: Verbalization of understanding the information provided will improve Outcome: Progressing   Problem: Self-Concept: Goal: Ability to identify factors that promote anxiety will improve Outcome: Progressing Goal: Level of anxiety will decrease Outcome: Progressing Goal: Ability to modify response to factors that promote anxiety will improve Outcome: Progressing

## 2018-12-16 NOTE — [facility] Suicide Risk Assessment (Signed)
[facility] Discharge Suicide Risk Assessment   Principal Problem: Panic attacks Discharge Diagnoses: Principal Problem:   Panic attacks Active Problems:   Cannabis abuse   Acute psychosis (HCC)   Total Time spent with patient: 45 minutes  Musculoskeletal: Strength & Muscle Tone: within normal limits Gait & Station: normal Patient leans: N/A  Psychiatric Specialty Exam: Review of Systems  Constitutional: Negative.   HENT: Negative.   Eyes: Negative.   Respiratory: Negative.   Cardiovascular: Negative.   Gastrointestinal: Negative.   Musculoskeletal: Negative.   Skin: Negative.   Neurological: Negative.   Psychiatric/Behavioral: Negative for depression, hallucinations, memory loss, substance abuse and suicidal ideas. The patient is nervous/anxious. The patient does not have insomnia.     Blood pressure 125/80, pulse (!) 55, temperature 98 F (36.7 C), temperature source Oral, resp. rate 18, height 6' (1.829 m), weight 60.3 kg, SpO2 100 %.Body mass index is 18.04 kg/m.  General Appearance: Casual  Eye Contact::  Good  Speech:  Slow409  Volume:  Decreased  Mood:  Anxious  Affect:  Congruent  Thought Process:  Goal Directed  Orientation:  Full (Time, Place, and Person)  Thought Content:  Logical  Suicidal Thoughts:  No  Homicidal Thoughts:  No  Memory:  Immediate;   Fair Recent;   Fair Remote;   Fair  Judgement:  Fair  Insight:  Fair  Psychomotor Activity:  Normal  Concentration:  Fair  Recall:  [employer] of Knowledge:Fair  Language: Fair  Akathisia:  No  Handed:  Right  AIMS (if indicated):     Assets:  Communication Skills Desire for Improvement Financial Resources/Insurance Housing Physical Health Resilience Social Support  Sleep:  Number of Hours: 6  Cognition: WNL  ADL's:  Intact   Mental Status Per Nursing Assessment::   On Admission:  NA  Demographic Factors:  Male and Unemployed  Loss Factors: Financial problems/change in socioeconomic  status  Historical Factors: Impulsivity  Risk Reduction Factors:   Responsible for children under 35 years of age, Sense of responsibility to family, Religious beliefs about death, Living with another person, especially a relative, Positive social support and Positive therapeutic relationship  Continued Clinical Symptoms:  Panic Attacks  Cognitive Features That Contribute To Risk:  Thought constriction (tunnel vision)    Suicide Risk:  Minimal: No identifiable suicidal ideation.  Patients presenting with no risk factors but with morbid ruminations; may be classified as minimal risk based on the severity of the depressive symptoms    Plan Of Care/Follow-up recommendations:  Activity:  Activity as tolerated Diet:  Regular diet Other:  Follow-up with current medication as well as following up with [facility] with Mr. Beverely Pace.  Avoid intoxicating drugs.  Get help immediately if psychotic symptoms or suicidality return.  At this point patient appears to be at low risk of suicide or aggression  Mordecai Rasmussen, MD 12/16/2018, 4:01 PM

## 2018-12-16 NOTE — Progress Notes (Signed)
Recreation Therapy Notes  Date: 12/16/2018  Time: 9:30 am  Location: Craft Room  Behavioral response: Appropriate  Intervention Topic: Time Management  Discussion/Intervention:  Group content today was focused on time management. The group defined time management and identified healthy ways to manage time. Individuals expressed how much of the 24 hours they use in a day. Patients expressed how much time they use just for themselves personally. The group expressed how they have managed their time in the past. Individuals participated in the intervention "Managing Life" where they had a chance to see how much of the 24 hours they use and where it goes. Clinical Observations/Feedback:  Patient came to group and expressed that he normally manages his time by looking at the clock and keeping track of time on his watch. Individual was social with peers and staff while participating in group.  Ereka Brau LRT/CTRS         Roda Lauture 12/16/2018 11:46 AM

## 2018-12-16 NOTE — Progress Notes (Signed)
[facility] MD Progress Note  12/16/2018 3:55 PM Joshua Shaffer.  MRN:  [id number] Subjective: Follow-up for this patient with what superficially seems to have been panic attacks although with psychotic symptoms as well.  Patient seen chart reviewed.  Reviewed with treatment team and representative from [facility].  Patient by the time I spoke with him was wide awake and able to engage in conversation.  He told me he was feeling better today.  He had slept well last night.  Claims to no longer be having hallucinations today.  Denies having any ideas of reference.  He said he still is having the spells of his heart racing but they are less prominent and he is less anxious.  Patient is requesting discharge.  He still stays pretty isolated to his room.  With his permission I spoke to his wife on the telephone.  She described his symptoms over the last few weeks and how concerned she was about them.  Confirmed that he was having what sounds like truly psychotic symptoms at home.  On the other hand she confirms that he was never seen as a danger to herself himself for the children and that she is glad to have him come home she just wants him to get treatment.  Patient appears to be tolerating medicine without difficulty at this point.  He is agreeable to following up with [facility] Principal Problem: Panic attacks Diagnosis: Principal Problem:   Panic attacks Active Problems:   Cannabis abuse   Acute psychosis (HCC)  Total Time spent with patient: 30 minutes  Past Psychiatric History: Patient has some history of anxiety for the last couple years.  Had seen his primary care doctor and been prescribed medicine such as BuSpar without any benefit.  The psychotic symptoms seem to be new onset.  Past Medical History:  Past Medical History:  Diagnosis Date  . Dizziness     Past Surgical History:  Procedure Laterality Date  . TONSILLECTOMY     Family History:  Family History  Problem Relation Age of Onset  . Lung cancer  Maternal Grandfather    Family Psychiatric  History: None known Social History:  Social History   Substance and Sexual Activity  Alcohol Use No   Comment: rarely     Social History   Substance and Sexual Activity  Drug Use No    Social History   Socioeconomic History  . Marital status: Married    Spouse name: Not on file  . Number of children: Not on file  . Years of education: Not on file  . Highest education level: Not on file  Occupational History  . Not on file  Social Needs  . Financial resource strain: Not on file  . Food insecurity:    Worry: Not on file    Inability: Not on file  . Transportation needs:    Medical: Not on file    Non-medical: Not on file  Tobacco Use  . Smoking status: Current Every Day Smoker    Packs/day: 0.50    Years: 14.00    Pack years: 7.00    Types: Cigarettes  . Smokeless tobacco: Never Used  Substance and Sexual Activity  . Alcohol use: No    Comment: rarely  . Drug use: No  . Sexual activity: Not on file  Lifestyle  . Physical activity:    Days per week: Not on file    Minutes per session: Not on file  . Stress: Not on file  Relationships  . Social connections:    Talks on phone: Not on file    Gets together: Not on file    Attends religious service: Not on file    Active member of club or organization: Not on file    Attends meetings of clubs or organizations: Not on file    Relationship status: Not on file  Other Topics Concern  . Not on file  Social History Narrative  . Not on file   Additional Social History:                         Sleep: Fair  Appetite:  Fair  Current Medications: Current Facility-Administered Medications  Medication Dose Route Frequency Provider Last Rate Last Dose  . acetaminophen (TYLENOL) tablet 650 mg  650 mg Oral Q6H PRN Mariel CraftMaurer, Sheila M, MD      . alum & mag hydroxide-simeth (MAALOX/MYLANTA) 200-200-20 MG/5ML suspension 30 mL  30 mL Oral Q4H PRN Mariel CraftMaurer, Sheila M, MD       . diphenhydrAMINE (BENADRYL) capsule 50 mg  50 mg Oral QHS PRN Mariel CraftMaurer, Sheila M, MD      . feeding supplement (ENSURE ENLIVE) (ENSURE ENLIVE) liquid 237 mL  237 mL Oral BID BM Clapacs, John T, MD   237 mL at 12/16/18 1519  . hydrOXYzine (ATARAX/VISTARIL) tablet 25 mg  25 mg Oral Q6H PRN Mariel CraftMaurer, Sheila M, MD      . risperiDONE (RISPERDAL M-TABS) disintegrating tablet 2 mg  2 mg Oral Q8H PRN Mariel CraftMaurer, Sheila M, MD       And  . LORazepam (ATIVAN) tablet 1 mg  1 mg Oral PRN Mariel CraftMaurer, Sheila M, MD       And  . ziprasidone (GEODON) injection 20 mg  20 mg Intramuscular PRN Mariel CraftMaurer, Sheila M, MD      . magnesium hydroxide (MILK OF MAGNESIA) suspension 30 mL  30 mL Oral Daily PRN Mariel CraftMaurer, Sheila M, MD      . Melene Muller[START ON 12/17/2018] multivitamin with minerals tablet 1 tablet  1 tablet Oral Daily Clapacs, John T, MD      . PARoxetine (PAXIL) tablet 20 mg  20 mg Oral Daily Clapacs, Jackquline DenmarkJohn T, MD   20 mg at 12/16/18 0810  . QUEtiapine (SEROQUEL) tablet 400 mg  400 mg Oral QHS Clapacs, Jackquline DenmarkJohn T, MD        Lab Results:  Results for orders placed or performed during the hospital encounter of 12/14/18 (from the past 48 hour(s))  Urine Drug Screen, Qualitative ([facility] only)     Status: Abnormal   Collection Time: 12/14/18  7:53 PM  Result Value Ref Range   Tricyclic, Ur Screen NONE DETECTED NONE DETECTED   Amphetamines, Ur Screen NONE DETECTED NONE DETECTED   MDMA (Ecstasy)Ur Screen NONE DETECTED NONE DETECTED   Cocaine Metabolite,Ur [place] NONE DETECTED NONE DETECTED   Opiate, Ur Screen NONE DETECTED NONE DETECTED   Phencyclidine (PCP) Ur S NONE DETECTED NONE DETECTED   Cannabinoid 50 Ng, Ur [place] POSITIVE (A) NONE DETECTED   Barbiturates, Ur Screen NONE DETECTED NONE DETECTED   Benzodiazepine, Ur Scrn NONE DETECTED NONE DETECTED   Methadone Scn, Ur NONE DETECTED NONE DETECTED    Comment: (NOTE) Tricyclics + metabolites, urine    Cutoff 1000 ng/mL Amphetamines + metabolites, urine  Cutoff 1000 ng/mL MDMA (Ecstasy), urine               Cutoff 500 ng/mL Cocaine Metabolite, urine  Cutoff 300 ng/mL Opiate + metabolites, urine        Cutoff 300 ng/mL Phencyclidine (PCP), urine         Cutoff 25 ng/mL Cannabinoid, urine                 Cutoff 50 ng/mL Barbiturates + metabolites, urine  Cutoff 200 ng/mL Benzodiazepine, urine              Cutoff 200 ng/mL Methadone, urine                   Cutoff 300 ng/mL The urine drug screen provides only a preliminary, unconfirmed analytical test result and should not be used for non-medical purposes. Clinical consideration and professional judgment should be applied to any positive drug screen result due to possible interfering substances. A more specific alternate chemical method must be used in order to obtain a confirmed analytical result. Gas chromatography / mass spectrometry (GC/MS) is the preferred confirmat ory method. Performed at [facility], [address] [place], [place] [address]     Blood Alcohol level:  Lab Results  Component Value Date   [facility] <10 12/14/2018    Metabolic Disorder Labs: Lab Results  Component Value Date   HGBA1C 5.5 12/14/2018   MPG 111.15 12/14/2018   No results found for: PROLACTIN Lab Results  Component Value Date   CHOL 91 12/14/2018   TRIG 92 12/14/2018   HDL 49 12/14/2018   CHOLHDL 1.9 12/14/2018   VLDL 18 12/14/2018   LDLCALC 24 12/14/2018    Physical Findings: AIMS:  , ,  ,  ,    CIWA:    COWS:     Musculoskeletal: Strength & Muscle Tone: within normal limits Gait & Station: normal Patient leans: N/A  Psychiatric Specialty Exam: Physical Exam  Nursing note and vitals reviewed. Constitutional: He appears well-developed and well-nourished.  HENT:  Head: Normocephalic and atraumatic.  Eyes: Pupils are equal, round, and reactive to light. Conjunctivae are normal.  Neck: Normal range of motion.  Cardiovascular: Regular rhythm and normal heart sounds.  Respiratory: Effort normal. No  respiratory distress.  GI: Soft.  Musculoskeletal: Normal range of motion.  Neurological: He is alert.  Skin: Skin is warm and dry.  Psychiatric: His mood appears anxious. His affect is blunt. His speech is delayed. He is slowed. He expresses impulsivity. He expresses no homicidal and no suicidal ideation. He exhibits abnormal recent memory.    Review of Systems  Constitutional: Negative.   HENT: Negative.   Eyes: Negative.   Respiratory: Negative.   Cardiovascular: Negative.   Gastrointestinal: Negative.   Musculoskeletal: Negative.   Skin: Negative.   Neurological: Negative.   Psychiatric/Behavioral: Positive for memory loss. Negative for depression, hallucinations, substance abuse and suicidal ideas. The patient is nervous/anxious. The patient does not have insomnia.     Blood pressure 125/80, pulse (!) 55, temperature 98 F (36.7 C), temperature source Oral, resp. rate 18, height 6' (1.829 m), weight 60.3 kg, SpO2 100 %.Body mass index is 18.04 kg/m.  General Appearance: Casual  Eye Contact:  Fair  Speech:  Normal Rate  Volume:  Normal  Mood:  Anxious  Affect:  Congruent  Thought Process:  Goal Directed  Orientation:  Full (Time, Place, and Person)  Thought Content:  Logical  Suicidal Thoughts:  No  Homicidal Thoughts:  No  Memory:  Immediate;   Fair Recent;   Fair Remote;   Fair  Judgement:  Fair  Insight:  Fair  Psychomotor Activity:  Decreased  Concentration:  Concentration: Fair  Recall:  [employer] of Knowledge:  Fair  Language:  Fair  Akathisia:  No  Handed:  Right  AIMS (if indicated):     Assets:  Communication Skills Desire for Improvement Financial Resources/Insurance Housing Physical Health Resilience Social Support  ADL's:  Intact  Cognition:  WNL  Sleep:  Number of Hours: 6     Treatment Plan Summary: Daily contact with patient to assess and evaluate symptoms and progress in treatment, Medication management and Plan Given that he is only  been on treatment for a day or so I felt like discharging him this afternoon was a little too early.  I am agreeable to discharging him tomorrow morning if he remains stable.  I have gotten him a 7-day supply of his medicine and he already has arrangements to go to [facility].  He will be given the phone number of Unk Pinto in the liaison from [facility].  Prescriptions will be provided.  Psychoeducation done with the patient.  Informed the wife is well that he can be discharged tomorrow morning.  Mordecai Rasmussen, MD 12/16/2018, 3:55 PM

## 2018-12-16 NOTE — Progress Notes (Signed)
Initial Nutrition Assessment  DOCUMENTATION CODES:   Underweight  INTERVENTION:   Ensure Enlive po BID, each supplement provides 350 kcal and 20 grams of protein  MVI daily   NUTRITION DIAGNOSIS:   Predicted suboptimal nutrient intake related to social / environmental circumstances as evidenced by other (comment)(per chart review).  GOAL:   Patient will meet greater than or equal to 90% of their needs  MONITOR:   PO intake, Supplement acceptance  REASON FOR ASSESSMENT:   Malnutrition Screening Tool    ASSESSMENT:   33 y/o male with h/o substance abuse admitted with panick attacks and acute psychosis.   RD working remotely.  Spoke with RN, pt eating well at breakfast and lunch today. There are no recent weights documented in chart to determine if any significant weight loss, only one weight from 12/2017 that is 180lbs. All of pt's previous weights seem to range around 60-70kg. Suspect pt with poor appetite and oral intake at times r/t substance abuse and psychosis. RD will add supplements and MVI.   Pt likely at high risk for developing malnutrition but unable to diagnose at this time.   Medications and reviewed  Diet Order:   Diet Order            Diet regular Room service appropriate? Yes; Fluid consistency: Thin  Diet effective now              EDUCATION NEEDS:   Not appropriate for education at this time  Skin:  Skin Assessment: Reviewed RN Assessment  Last BM:  4/6  Height:   Ht Readings from Last 1 Encounters:  12/14/18 6' (1.829 m)    Weight:   Wt Readings from Last 1 Encounters:  12/14/18 60.3 kg    Ideal Body Weight:  80.9 kg  BMI:  Body mass index is 18.04 kg/m.  Estimated Nutritional Needs:   Kcal:  1800-2100kcal/day   Protein:  80-90g/day   Fluid:  >1.8L/day   Betsey Holiday MS, RD, LDN Pager #- [phone] Office#- [phone] After Hours Pager: [phone]

## 2018-12-16 NOTE — Plan of Care (Signed)
Patient is alert and oriented X 3, denies SI, HI and AVH. Patient complains of " Not feeling quite right. I mean I feel like myself but something is off." Patient complains of insomnia, slept until about 3:30 am. Patient does attend groups, and is pleasant and cooperative. No self harming behaviors observed. RN will continue; Q 15 minutes safety checks. Problem: Education: Goal: Knowledge of [facility] Education information/materials will improve Outcome: Progressing Goal: Emotional status will improve Outcome: Progressing Goal: Mental status will improve Outcome: Progressing Goal: Verbalization of understanding the information provided will improve Outcome: Progressing   Problem: Self-Concept: Goal: Ability to identify factors that promote anxiety will improve Outcome: Progressing Goal: Level of anxiety will decrease Outcome: Progressing Goal: Ability to modify response to factors that promote anxiety will improve Outcome: Progressing

## 2018-12-17 DIAGNOSIS — F1210 Cannabis abuse, uncomplicated: Secondary | ICD-10-CM

## 2018-12-17 DIAGNOSIS — F23 Brief psychotic disorder: Secondary | ICD-10-CM

## 2018-12-17 MED ORDER — PAROXETINE HCL 20 MG PO TABS
20.0000 mg | ORAL_TABLET | Freq: Every day | ORAL | Status: DC
  Filled 2018-12-17: qty 1

## 2018-12-17 MED ORDER — QUETIAPINE FUMARATE 200 MG PO TABS: 300.0000 mg | ORAL_TABLET | Freq: Every day | ORAL | Status: DC

## 2018-12-17 NOTE — Progress Notes (Signed)
  [facility] Adult Case Management Discharge Plan :  Will you be returning to the same living situation after discharge:  Yes,  home At discharge, do you have transportation home?: Yes,  family member Do you have the ability to pay for your medications: Yes,  has Medicaid  Release of information consent forms completed and in the chart;  Patient's signature needed at discharge.  Patient to Follow up at: Follow-up Information    [facility] Call.   Why:  Patient will call Lorella Nimrod at [phone]- to schedule appointment  Contact information: [address] [place] [address] [phone]           Next level of care provider has access to [facility] Link:yes  Safety Planning and Suicide Prevention discussed: No.  Have you used any form of tobacco in the last 30 days? (Cigarettes, Smokeless Tobacco, Cigars, and/or Pipes): Yes  Has patient been referred to the Quitline?: N/A patient is not a smoker  Patient has been referred for addiction treatment: Yes  Natalija Mavis  CUEBAS-COLON, LCSW 12/17/2018, 10:49 AM

## 2018-12-17 NOTE — Progress Notes (Signed)
D: Patient has been isolative to room. Denies SI, HI and AV hallucinations. Medication compliant. Affect is flat. Mood sad. Appears internally preoccupied. Hygiene is unremarkable. Cooperative with staff. A: Continue to monitor for safety and offer support. R: Safety maintained. 

## 2018-12-17 NOTE — Progress Notes (Signed)
D: Patient is aware of  Discharge this shift .Patient denies suicidal /homicidal ideations. Patient received all belongings brought in  A: Received Storage medications. Writer reviewed Discharge Summary, Suicide Risk Assessment, and Transitional Record. Patient also received Prescriptions   from  MD. A 7 day supply of medications given to patient  Aware  Of follow up appointment . R: Patient left unit with no questions  Or concerns  With family

## 2018-12-17 NOTE — Plan of Care (Signed)
D: Patient has been isolative to room. Denies SI, HI and AV hallucinations. Medication compliant. Affect is flat. Mood sad. Appears internally preoccupied. Hygiene is unremarkable. Cooperative with staff. A: Continue to monitor for safety and offer support. R: Safety maintained.

## 2018-12-17 NOTE — Discharge Summary (Signed)
Physician Discharge Summary Note  Patient:  Joshua Shaffer. is an 33 y.o., male MRN:  [id number] DOB:  1986/05/11 Patient phone:  [phone] (home)  Patient address:   [address] [place] [address] [place] [id number],  Total Time spent with patient: 30 minutes  Date of Admission:  12/14/2018 Date of Discharge: 12/17/18  Reason for Admission:  Acute Psychosis  Principal Problem: Panic attacks Discharge Diagnoses: Principal Problem:   Panic attacks Active Problems:   Cannabis abuse   Acute psychosis (HCC)  History of Present Illness: Patient seen chart reviewed.  Patient with minimal past psychiatric history presented with complaints of a week to 2 weeks of anxiety symptoms accompanied by psychotic symptoms.  He complains that at nighttime when he tries to go to bed his heart will start racing and he will become short of breath.  He will be overwhelmed with the believe that he is going to die.  At times he will have auditory hallucinations hearing voices telling him that he is going to die.  As time has gone on these panic attack-like symptoms of happen more frequently in the day as well although he denies having hallucinations during the day.  Of some concern he is also endorsing having ideas of reference believing that the television set is speaking to him and being frightened to stand near a television.  Patient has gradually reduced any activity going outside of the house and become more frightened.  He denies any homicidal ideation.  Admits to some suicidal thoughts but without any intent or plan of acting on them.  Admits to regular use of marijuana probably a couple of hits a day.  Denies other drug use.  Feels like he has had a lot of stress in his life being out of work and now with the extra stress of the current virus situation.  Not currently receiving any psychiatric treatment   Past Psychiatric History: Patient says he has long felt that he had anxiety and depression but had never  seen a psychiatrist or therapist.  Never been on any psychiatric medicine in the past.  No history of suicide attempts or violence.  Substance Abuse History: Patient admits to smoking marijuana several times a week but no other illicit drug use.  He denies any heavy alcohol use, cocaine, opioid or stimulant use.  Social History: The patient is currently married and has 3 children.  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   Family Psychiatric  History: He denies any history of any mental illness or substance use in the family     Past Medical History:  Past Medical History:  Diagnosis Date  . Dizziness     Past Surgical History:  Procedure Laterality Date  . TONSILLECTOMY     Family History:  Family History  Problem Relation Age of Onset  . Lung cancer Maternal Grandfather     Social History:  Social History   Substance and Sexual Activity  Alcohol Use No   Comment: rarely     Social History   Substance and Sexual Activity  Drug Use No    Social History   Socioeconomic History  . Marital status: Married    Spouse name: Not on file  . Number of children: Not on file  . Years of education: Not on file  . Highest education level: Not on file  Occupational History  . Not on file  Social Needs  .  Financial resource strain: Not on file  . Food insecurity:    Worry: Not on file    Inability: Not on file  . Transportation needs:    Medical: Not on file    Non-medical: Not on file  Tobacco Use  . Smoking status: Current Every Day Smoker    Packs/day: 0.50    Years: 14.00    Pack years: 7.00    Types: Cigarettes  . Smokeless tobacco: Never Used  Substance and Sexual Activity  . Alcohol use: No    Comment: rarely  . Drug use: No  . Sexual activity: Not on file  Lifestyle  . Physical activity:    Days per week: Not on file    Minutes per session: Not on file  . Stress: Not on file  Relationships  . Social  connections:    Talks on phone: Not on file    Gets together: Not on file    Attends religious service: Not on file    Active member of club or organization: Not on file    Attends meetings of clubs or organizations: Not on file    Relationship status: Not on file  Other Topics Concern  . Not on file  Social History Narrative  . Not on file    Hospital Course:     [name] is a 33 year old married African-American male with no significant past psychiatric history other than anxiety who was admitted to inpatient psychiatry for medication management, safety and stabilization after endorsing some paranoid thoughts and a fear that he was going to die.  He was having some hallucination and possible ideas of reference at the time of admission.  The patient was initially started on Paxil 20 mg p.o. daily for anxiety, depression and panic attacks.  He was also started on Seroquel now at a total of 300 mg p.o. nightly for psychosis and high levels of anxiety.  Both BuSpar and hydroxyzine were discontinued.  The patient was compliant with psychotropic medications and tolerated them well.  While he initially did endorse some hallucinations as well as high levels of anxiety, mood symptoms tended to improve during his hospitalization.  He says panic attacks including feeling like his heart was racing and he was having shortness of breath decreased and were no longer present after he took the Paxil.  The patient denied any current active or passive suicidal thoughts throughout his hospitalization.  No homicidal thoughts.  He was calm and cooperative on the unit and interacted with peers and staff fairly well.  He did try to attend groups but was anxious for discharge as he did not feel like he needed to be in the hospital.  He came into the hospital voluntarily and was ready for discharge after just a few days.  He was no longer having any irrational thoughts about dying and panic attacks were not present on the  day of discharge.  He denied any current active or passive suicidal thoughts on the day of discharge was able to contract for safety.  The patient was agreeable to follow-up with [facility] for psychotropic medication management..  Substance abuse was also addressed during his hospitalization.  He did smoke marijuana prior to admission and time spent discussing paranoid thoughts associated with cannabis use.  The patient verbally agreed to abstain from marijuana at the time of discharge.  He denied any other illicit drug use.  Dr. Toni Amend spoke to the patient's wife prior to discharge on April 10 and she  was agreeable to having him return home.  Follow-up was scheduled at [facility] for psychotropic medication management.    Musculoskeletal: Strength & Muscle Tone: within normal limits Gait & Station: normal Patient leans: N/A  Psychiatric Specialty Exam: Physical Exam  Nursing note and vitals reviewed.   Review of Systems  Constitutional: Negative.   HENT:       No longer having dizziness.  Eyes: Negative.   Respiratory: Negative.   Cardiovascular: Negative.   Gastrointestinal: Negative.   Genitourinary: Negative.   Musculoskeletal: Negative.   Skin: Negative.   Neurological: Negative.   Endo/Heme/Allergies: Negative.   Psychiatric/Behavioral: Negative.     Blood pressure (!) 113/93, pulse 71, temperature 97.9 F (36.6 C), temperature source Oral, resp. rate 18, height 6' (1.829 m), weight 60.3 kg, SpO2 100 %.Body mass index is 18.04 kg/m.  General Appearance: Casual  Eye Contact:  Good  Speech:  Clear and Coherent and Normal Rate  Volume:  Normal  Mood:  OK  Affect:  Sad appearing  Thought Process:  Coherent, Goal Directed and Linear  Orientation:  Full (Time, Place, and Person)  Thought Content:  Logical  Suicidal Thoughts:  No  Homicidal Thoughts:  No  Memory:  Immediate;   Good Recent;   Good Remote;   Good  Judgement:  Good  Insight:  Fair  Psychomotor Activity:  Normal   Concentration:  Concentration: Good and Attention Span: Good  Recall:  Good  Fund of Knowledge:  Good  Language:  Good  Akathisia:  No  Handed:  Right  AIMS (if indicated):     Assets:  Communication Skills Desire for Improvement Housing Physical Health Transportation  ADL's:  Intact  Cognition:  WNL  Sleep:  Number of Hours: 6     Have you used any form of tobacco in the last 30 days? (Cigarettes, Smokeless Tobacco, Cigars, and/or Pipes): Yes  Has this patient used any form of tobacco in the last 30 days? (Cigarettes, Smokeless Tobacco, Cigars, and/or Pipes) Yes, Yes, Prescription not provided because: Patient not interested  Blood Alcohol level:  Lab Results  Component Value Date   ETH <10 12/14/2018    Metabolic Disorder Labs:  Lab Results  Component Value Date   HGBA1C 5.5 12/14/2018   MPG 111.15 12/14/2018   No results found for: PROLACTIN Lab Results  Component Value Date   CHOL 91 12/14/2018   TRIG 92 12/14/2018   HDL 49 12/14/2018   CHOLHDL 1.9 12/14/2018   VLDL 18 12/14/2018   LDLCALC 24 12/14/2018    See Psychiatric Specialty Exam and Suicide Risk Assessment completed by Attending Physician prior to discharge.  Discharge destination:  Home  Is patient on multiple antipsychotic therapies at discharge: NA  Has Patient had three or more failed trials of antipsychotic monotherapy by history:  NA  Recommended Plan for Multiple Antipsychotic Therapies: NA  Discharge Instructions    Diet - low sodium heart healthy   Complete by:  As directed    Increase activity slowly   Complete by:  As directed      Allergies as of 12/17/2018   No Known Allergies     Medication List    STOP taking these medications   busPIRone 5 MG tablet Commonly known as:  BUSPAR   hydrOXYzine 10 MG tablet Commonly known as:  ATARAX/VISTARIL     TAKE these medications     Indication  PARoxetine 20 MG tablet Commonly known as:  PAXIL Take 1 tablet (20 mg total)  by  mouth daily.  Indication:  Panic Disorder   QUEtiapine 300 MG tablet Commonly known as:  SEROQUEL Take 1 tablet (300 mg total) by mouth at bedtime.  Indication:  Major Depressive Disorder        Follow-up recommendations:  Activity:  As tolerated  Comments: He is being discharged home with his wife and will followup with [facility]  Signed: Darliss Ridgel, MD 12/17/2018, 10:23 AM

## 2019-02-02 ENCOUNTER — Telehealth: Payer: Self-pay

## 2019-02-02 NOTE — Telephone Encounter (Signed)
Returned by mail incomplete new patient application to [employer] Highlighted items needed to process this application. Once patient returns complete application we will be able to make a new patient appt.

## 2019-02-27 ENCOUNTER — Other Ambulatory Visit: Payer: Self-pay

## 2019-02-27 ENCOUNTER — Ambulatory Visit: Payer: Self-pay | Admitting: Pharmacy Technician

## 2019-02-27 DIAGNOSIS — Z79899 Other long term (current) drug therapy: Secondary | ICD-10-CM

## 2019-02-27 NOTE — Progress Notes (Signed)
Read [facility]'s contract over-the-phone.  Patient verbally agreed to all terms of the [facility] contract.  Mailing to patient for signature.    Patient approved to receive medication assistance as long as eligibility criteria continues to be met.    Provided patient with Civil engineer, contracting based on his particular needs.    [facility]

## 2019-11-11 ENCOUNTER — Encounter: Payer: Self-pay | Admitting: Emergency Medicine

## 2019-11-11 ENCOUNTER — Emergency Department
Admission: EM | Admit: 2019-11-11 | Discharge: 2019-11-11 | Disposition: A | Discharge status: Home / Self Care | Payer: Self-pay | Attending: Emergency Medicine | Admitting: Emergency Medicine

## 2019-11-11 ENCOUNTER — Other Ambulatory Visit: Payer: Self-pay

## 2019-11-11 DIAGNOSIS — Z79899 Other long term (current) drug therapy: Secondary | ICD-10-CM | POA: Insufficient documentation

## 2019-11-11 DIAGNOSIS — K5900 Constipation, unspecified: Secondary | ICD-10-CM | POA: Insufficient documentation

## 2019-11-11 DIAGNOSIS — F17210 Nicotine dependence, cigarettes, uncomplicated: Secondary | ICD-10-CM | POA: Insufficient documentation

## 2019-11-11 DIAGNOSIS — R198 Other specified symptoms and signs involving the digestive system and abdomen: Secondary | ICD-10-CM

## 2019-11-11 LAB — CBC WITH DIFFERENTIAL/PLATELET
Abs Immature Granulocytes: 0.2 10*3/uL — ABNORMAL HIGH (ref 0.00–0.07)
Basophils Absolute: 0.1 10*3/uL (ref 0.0–0.1)
Basophils Relative: 0 %
Eosinophils Absolute: 0 10*3/uL (ref 0.0–0.5)
Eosinophils Relative: 0 %
HCT: 42.6 % (ref 39.0–52.0)
Hemoglobin: 15.3 g/dL (ref 13.0–17.0)
Immature Granulocytes: 1 %
Lymphocytes Relative: 12 %
Lymphs Abs: 2.5 10*3/uL (ref 0.7–4.0)
MCH: 31.4 pg (ref 26.0–34.0)
MCHC: 35.9 g/dL (ref 30.0–36.0)
MCV: 87.3 fL (ref 80.0–100.0)
Monocytes Absolute: 1 10*3/uL (ref 0.1–1.0)
Monocytes Relative: 5 %
Neutro Abs: 17 10*3/uL — ABNORMAL HIGH (ref 1.7–7.7)
Neutrophils Relative %: 82 %
Platelets: 303 10*3/uL (ref 150–400)
RBC: 4.88 MIL/uL (ref 4.22–5.81)
RDW: 13 % (ref 11.5–15.5)
WBC: 20.9 10*3/uL — ABNORMAL HIGH (ref 4.0–10.5)
nRBC: 0 % (ref 0.0–0.2)

## 2019-11-11 LAB — URINALYSIS, COMPLETE (UACMP) WITH MICROSCOPIC
Bacteria, UA: NONE SEEN
Bilirubin Urine: NEGATIVE
Glucose, UA: NEGATIVE mg/dL
Hgb urine dipstick: NEGATIVE
Ketones, ur: NEGATIVE mg/dL
Leukocytes,Ua: NEGATIVE
Nitrite: NEGATIVE
Protein, ur: 30 mg/dL — AB
Specific Gravity, Urine: 1.017 (ref 1.005–1.030)
Squamous Epithelial / HPF: NONE SEEN (ref 0–5)
pH: 6 (ref 5.0–8.0)

## 2019-11-11 LAB — BASIC METABOLIC PANEL
Anion gap: 13 (ref 5–15)
BUN: 7 mg/dL (ref 6–20)
CO2: 22 mmol/L (ref 22–32)
Calcium: 9.1 mg/dL (ref 8.9–10.3)
Chloride: 101 mmol/L (ref 98–111)
Creatinine, Ser: 0.92 mg/dL (ref 0.61–1.24)
GFR calc Af Amer: >60 mL/min (ref >60)
GFR calc non Af Amer: >60 mL/min (ref >60)
Glucose, Bld: 112 mg/dL — ABNORMAL HIGH (ref 70–99)
Potassium: 3.2 mmol/L — ABNORMAL LOW (ref 3.5–5.1)
Sodium: 136 mmol/L (ref 135–145)

## 2019-11-11 MED ORDER — SODIUM CHLORIDE 0.9 % IV BOLUS: 1000.0000 mL | Freq: Once | INTRAVENOUS | Status: DC

## 2019-11-11 MED ORDER — ONDANSETRON HCL 4 MG/2ML IJ SOLN
4.0000 mg | Freq: Once | INTRAMUSCULAR | Status: DC
  Filled 2019-11-11: qty 2

## 2019-11-11 MED ORDER — ONDANSETRON 4 MG PO TBDP
4.0000 mg | ORAL_TABLET | Freq: Once | ORAL | Status: AC
  Administered 2019-11-11: 4 mg via ORAL

## 2019-11-11 MED ORDER — ONDANSETRON 4 MG PO TBDP
ORAL_TABLET | ORAL | Status: AC
  Filled 2019-11-11: qty 1

## 2019-11-11 MED ORDER — ONDANSETRON HCL 4 MG PO TABS: 4.0000 mg | ORAL_TABLET | Freq: Three times a day (TID) | ORAL | 0 refills | Status: DC | PRN

## 2019-11-11 NOTE — ED Notes (Signed)
Pt refuses IV at this time stating "I'm very scared I'll drink water but I don't want that." Pt educated on medical reason for IV but refuses- pt states he will allow butterfly for blood work. MD notified.

## 2019-11-11 NOTE — ED Notes (Signed)
Pt provided water and crackers for PO challenge  

## 2019-11-11 NOTE — Discharge Instructions (Addendum)
Please seek medical attention for any high fevers, chest pain, shortness of breath, change in behavior, persistent vomiting, bloody stool or any other new or concerning symptoms.  

## 2019-11-11 NOTE — ED Notes (Signed)
Pt able to eat and drink w/ out n/v

## 2019-11-11 NOTE — ED Notes (Signed)
Bladder scan 14 ml

## 2019-11-11 NOTE — ED Provider Notes (Signed)
[facility] Emergency Department Provider Note  ____________________________________________   I have reviewed the triage vital signs and the nursing notes.   HISTORY  Chief Complaint Constipation  History limited by: Not Limited   HPI Joshua Shaffer. is a 34 y.o. male who presents to the emergency department today because of primary concern for constipation.  Patient states that for the past 3 days he has noticed he is really had to strain to have a bowel movement.  When he does it will be black.  He has noticed that during this time he is also had a difficulty with initiating urinary stream.  He also feels like he has to apply pressure for this to happen.  He has noticed that his urine has been darker as well.  He does have a history of mental health issues and says that he thinks he is been slightly more paranoid and thinks he might also be having some hallucinations.  He states he has been taking his psychiatric medications.   Records reviewed. Per medical record review patient has a history of hallucinations, anxiety.  Past Medical History:  Diagnosis Date  . Dizziness     Patient Active Problem List   Diagnosis Date Noted  . Panic attacks 12/15/2018  . Hallucinations 12/14/2018  . Cannabis abuse 12/14/2018  . Acute psychosis (HCC) 12/14/2018  . Insomnia 03/27/2017  . Anxiety with depression 02/25/2017  . Dizziness 02/25/2017    Past Surgical History:  Procedure Laterality Date  . TONSILLECTOMY      Prior to Admission medications   Medication Sig Start Date End Date Taking? Authorizing Provider  PARoxetine (PAXIL) 20 MG tablet Take 1 tablet (20 mg total) by mouth daily. 12/17/18   Clapacs, Jackquline Denmark, MD  QUEtiapine (SEROQUEL) 300 MG tablet Take 1 tablet (300 mg total) by mouth at bedtime. 12/16/18   Clapacs, Jackquline Denmark, MD    Allergies Patient has no known allergies.  Family History  Problem Relation Age of Onset  . Lung cancer Maternal  Grandfather     Social History Social History   Tobacco Use  . Smoking status: Current Every Day Smoker    Packs/day: 0.50    Years: 14.00    Pack years: 7.00    Types: Cigarettes  . Smokeless tobacco: Never Used  Substance Use Topics  . Alcohol use: No    Comment: rarely  . Drug use: Yes    Types: Marijuana    Review of Systems Constitutional: Feels hot and cold.  Eyes: No visual changes. ENT: No sore throat. Cardiovascular: Denies chest pain. Respiratory: Denies shortness of breath. Gastrointestinal: Positive for constipation and dark stool.   Genitourinary: Positive for difficulty with initiating urinary stream. Musculoskeletal: Negative for back pain. Skin: Negative for rash. Neurological: Negative for headaches, focal weakness or numbness.  ____________________________________________   PHYSICAL EXAM:  VITAL SIGNS: ED Triage Vitals  Enc Vitals Group     BP 11/11/19 2006 (!) 140/92     Pulse Rate 11/11/19 2006 82     Resp 11/11/19 2006 20     Temp 11/11/19 2006 98.5 F (36.9 C)     Temp Source 11/11/19 2006 Oral     SpO2 11/11/19 2006 100 %     Weight 11/11/19 2005 140 lb (63.5 kg)     Height 11/11/19 2005 6' (1.829 m)     Head Circumference --      Peak Flow --      Pain Score 11/11/19  2004 7    Constitutional: Alert and oriented.  Eyes: Conjunctivae are normal.  ENT      Head: Normocephalic and atraumatic.      Nose: No congestion/rhinnorhea.      Mouth/Throat: Mucous membranes are moist.      Neck: No stridor. Hematological/Lymphatic/Immunilogical: No cervical lymphadenopathy. Cardiovascular: Normal rate, regular rhythm.  No murmurs, rubs, or gallops.  Respiratory: Normal respiratory effort without tachypnea nor retractions. Breath sounds are clear and equal bilaterally. No wheezes/rales/rhonchi. Gastrointestinal: Soft and non tender. No rebound. No guarding.  Genitourinary: Deferred Musculoskeletal: Normal range of motion in all extremities.  No lower extremity edema. Neurologic:  Normal speech and language. No gross focal neurologic deficits are appreciated.  Skin:  Skin is warm, dry and intact. No rash noted. Psychiatric: Mood and affect are normal. Speech and behavior are normal. Patient exhibits appropriate insight and judgment.  ____________________________________________    LABS (pertinent positives/negatives)  CBC wbc 20.9, hgb 15.3, plt 303 BMP wnl except k 3.2, glu 112 UA clear, 0-5 rbc and wbc  ____________________________________________   EKG  None  ____________________________________________    RADIOLOGY  None   ____________________________________________   PROCEDURES  Procedures  ____________________________________________   INITIAL IMPRESSION / ASSESSMENT AND PLAN / ED COURSE  Pertinent labs & imaging results that were available during my care of the patient were reviewed by me and considered in my medical decision making (see chart for details).   Patient presented to the emergency department today with main complaint for issues with constipation.  He also had complaints of decreased urination as well.  On exam patient's abdomen is soft and nontender.  Patient afebrile here in the emergency department.  Patient was noted to have a leukocytosis and his blood work.  Previous visit he also had a mild leukocytosis.  I did discuss this with the patient.  I did have some concerns for possible intra-abdominal infection although abdomen is quite benign at this time.  I did offer CT scan to the patient however he did feel better after the Zofran and requested to be discharged home.  Will plan on discharging home although we did discuss strict return precautions.   ____________________________________________   FINAL CLINICAL IMPRESSION(S) / ED DIAGNOSES  Final diagnoses:  Gastrointestinal complaint     Note: This dictation was prepared with Dragon dictation. Any transcriptional errors  that result from this process are unintentional     Nance Pear, MD 11/11/19 2150

## 2019-11-11 NOTE — ED Triage Notes (Signed)
Pt arrives ambulatory to triage with c/o anxiety and not being able to void. Pt states that when he does void it is dark but pt does not report any odor.

## 2019-11-11 NOTE — ED Notes (Addendum)
Pt states that he's been constipated for about 3 days and his stool is black. Pt reports he's been drinking well but not eating a lot due to n/v, Pt reports he threw up several times today, has been very anxious and paranoid, and states "my genitals feel very tight and I have to push hard to start urinating." Pt reports he smokes weed and has been smoking weed today as well. Pt denies SI/HI and denies alcohol and other drug use. Pt reports back pain but denies pelvis/flank/abdominal/genital pain.

## 2019-11-21 ENCOUNTER — Telehealth: Payer: Self-pay | Admitting: Pharmacy Technician

## 2019-11-21 NOTE — Telephone Encounter (Signed)
[facility] provided patient with a 30 day supply of medications.  Patient is still to provide last 30 days of pay stubs and 2020 Federal Tax Return.  Patient understands that failure to provide the requested financial information by 12/21/19 will prevent [facility] from being able to provide additional medication assistance.  Sherilyn Dacosta Care Manager [facility]

## 2019-12-21 ENCOUNTER — Telehealth: Payer: Self-pay | Admitting: Pharmacy Technician

## 2019-12-21 NOTE — Telephone Encounter (Signed)
Patient still needs to provide 2020 Federal Tax Return from spouse, Caro Hight and last 30 days of bank statements.  Patient acknowledged that he understands that failure to provide requested financial documentation by 01/22/20 that [facility] will be unable to provide additional medication assistance.  Sherilyn Dacosta Care Manager [facility]

## 2019-12-27 ENCOUNTER — Encounter: Payer: Self-pay | Admitting: Emergency Medicine

## 2019-12-27 ENCOUNTER — Other Ambulatory Visit: Payer: Self-pay

## 2019-12-27 ENCOUNTER — Emergency Department
Admission: EM | Admit: 2019-12-27 | Discharge: 2019-12-27 | Disposition: A | Discharge status: Home / Self Care | Payer: Self-pay | Attending: Emergency Medicine | Admitting: Emergency Medicine

## 2019-12-27 ENCOUNTER — Emergency Department
Admission: EM | Admit: 2019-12-27 | Discharge: 2019-12-29 | Disposition: A | Discharge status: Other Acute Inpatient Hospital | Payer: Self-pay | Attending: Emergency Medicine | Admitting: Emergency Medicine

## 2019-12-27 ENCOUNTER — Encounter: Payer: Self-pay | Admitting: *Deleted

## 2019-12-27 ENCOUNTER — Emergency Department: Payer: Self-pay

## 2019-12-27 DIAGNOSIS — Z79899 Other long term (current) drug therapy: Secondary | ICD-10-CM | POA: Insufficient documentation

## 2019-12-27 DIAGNOSIS — F23 Brief psychotic disorder: Secondary | ICD-10-CM | POA: Diagnosis present

## 2019-12-27 DIAGNOSIS — F419 Anxiety disorder, unspecified: Secondary | ICD-10-CM

## 2019-12-27 DIAGNOSIS — Z20822 Contact with and (suspected) exposure to covid-19: Secondary | ICD-10-CM | POA: Insufficient documentation

## 2019-12-27 DIAGNOSIS — R0602 Shortness of breath: Secondary | ICD-10-CM | POA: Insufficient documentation

## 2019-12-27 DIAGNOSIS — R000 Tachycardia, unspecified: Secondary | ICD-10-CM | POA: Insufficient documentation

## 2019-12-27 DIAGNOSIS — F17210 Nicotine dependence, cigarettes, uncomplicated: Secondary | ICD-10-CM | POA: Insufficient documentation

## 2019-12-27 DIAGNOSIS — Z5321 Procedure and treatment not carried out due to patient leaving prior to being seen by health care provider: Secondary | ICD-10-CM | POA: Insufficient documentation

## 2019-12-27 DIAGNOSIS — F29 Unspecified psychosis not due to a substance or known physiological condition: Secondary | ICD-10-CM | POA: Insufficient documentation

## 2019-12-27 LAB — CBC
HCT: 43.8 % (ref 39.0–52.0)
Hemoglobin: 15.3 g/dL (ref 13.0–17.0)
MCH: 30.9 pg (ref 26.0–34.0)
MCHC: 34.9 g/dL (ref 30.0–36.0)
MCV: 88.5 fL (ref 80.0–100.0)
Platelets: 290 10*3/uL (ref 150–400)
RBC: 4.95 MIL/uL (ref 4.22–5.81)
RDW: 13.2 % (ref 11.5–15.5)
WBC: 13 10*3/uL — ABNORMAL HIGH (ref 4.0–10.5)
nRBC: 0 % (ref 0.0–0.2)

## 2019-12-27 LAB — COMPREHENSIVE METABOLIC PANEL
ALT: 41 U/L (ref 0–44)
AST: 25 U/L (ref 15–41)
Albumin: 4.4 g/dL (ref 3.5–5.0)
Alkaline Phosphatase: 91 U/L (ref 38–126)
Anion gap: 8 (ref 5–15)
BUN: 9 mg/dL (ref 6–20)
CO2: 27 mmol/L (ref 22–32)
Calcium: 9.2 mg/dL (ref 8.9–10.3)
Chloride: 103 mmol/L (ref 98–111)
Creatinine, Ser: 1.07 mg/dL (ref 0.61–1.24)
GFR calc Af Amer: >60 mL/min (ref >60)
GFR calc non Af Amer: >60 mL/min (ref >60)
Glucose, Bld: 111 mg/dL — ABNORMAL HIGH (ref 70–99)
Potassium: 3.5 mmol/L (ref 3.5–5.1)
Sodium: 138 mmol/L (ref 135–145)
Total Bilirubin: 0.7 mg/dL (ref 0.3–1.2)
Total Protein: 7.9 g/dL (ref 6.5–8.1)

## 2019-12-27 LAB — URINE DRUG SCREEN, QUALITATIVE (ARMC ONLY)
Amphetamines, Ur Screen: NOT DETECTED
Barbiturates, Ur Screen: NOT DETECTED
Benzodiazepine, Ur Scrn: NOT DETECTED
Cannabinoid 50 Ng, Ur ~~LOC~~: POSITIVE — AB
Cocaine Metabolite,Ur ~~LOC~~: NOT DETECTED
MDMA (Ecstasy)Ur Screen: NOT DETECTED
Methadone Scn, Ur: NOT DETECTED
Opiate, Ur Screen: NOT DETECTED
Phencyclidine (PCP) Ur S: NOT DETECTED
Tricyclic, Ur Screen: NOT DETECTED

## 2019-12-27 LAB — TROPONIN I (HIGH SENSITIVITY): Troponin I (High Sensitivity): 45 ng/L — ABNORMAL HIGH (ref <18)

## 2019-12-27 MED ORDER — LORAZEPAM 1 MG PO TABS
1.0000 mg | ORAL_TABLET | Freq: Once | ORAL | Status: AC
  Administered 2019-12-27: 1 mg via ORAL
  Filled 2019-12-27: qty 1

## 2019-12-27 NOTE — ED Triage Notes (Signed)
Pt reports feeling scared.  Pt denies SI or HI.  Pt denies drug use or etoh use.  Pt was here earlier tonight for panic attack but left before seeing a doctor.  Pt calm and cooperative.

## 2019-12-27 NOTE — ED Triage Notes (Signed)
Pt reports that he feels like he cannot breath and like his heart is beating rapidly. Pt states has felt like this a couple of days and recently had his Seroquel adjusted.

## 2019-12-27 NOTE — ED Notes (Signed)
Pt cooperative with this RN and willing to answer all questions. Pt tearful after talking about lack of sleep and feeling out of control. Pt resting in bed and verbalized understanding of ED process.

## 2019-12-27 NOTE — ED Provider Notes (Signed)
[facility] Emergency Department Provider Note  ____________________________________________   First MD Initiated Contact with Patient 12/27/19 2323     (approximate)  I have reviewed the triage vital signs and the nursing notes.   HISTORY  Chief Complaint Behavior Problem   HPI Joshua Shaffer. is a 34 y.o. male with below list of previous medical conditions presents to the emergency department secondary to feelings of anxiety.  Patient states "just freaking out".  Patient states that he was prescribed Seroquel and believe that it was working however his physician has changed from Seroquel to another medication.  Patient states of the past week he has felt increasing levels of anxiety.  Patient denies any suicidal or homicidal ideation.  Patient denies any auditory visual hallucinations.  Patient does admit to marijuana use at noon today.        Past Medical History:  Diagnosis Date  . Dizziness     Patient Active Problem List   Diagnosis Date Noted  . Panic attacks 12/15/2018  . Hallucinations 12/14/2018  . Cannabis abuse 12/14/2018  . Acute psychosis ([place]) 12/14/2018  . Insomnia 03/27/2017  . Anxiety with depression 02/25/2017  . Dizziness 02/25/2017    Past Surgical History:  Procedure Laterality Date  . TONSILLECTOMY      Prior to Admission medications   Medication Sig Start Date End Date Taking? Authorizing Provider  ondansetron (ZOFRAN) 4 MG tablet Take 1 tablet (4 mg total) by mouth every 8 (eight) hours as needed. 11/11/19   Nance Pear, MD  PARoxetine (PAXIL) 20 MG tablet Take 1 tablet (20 mg total) by mouth daily. 12/17/18   Clapacs, Madie Reno, MD  QUEtiapine (SEROQUEL) 300 MG tablet Take 1 tablet (300 mg total) by mouth at bedtime. 12/16/18   Clapacs, Madie Reno, MD    Allergies Patient has no known allergies.  Family History  Problem Relation Age of Onset  . Lung cancer Maternal Grandfather     Social History Social  History   Tobacco Use  . Smoking status: Current Every Day Smoker    Packs/day: 0.50    Years: 14.00    Pack years: 7.00    Types: Cigarettes  . Smokeless tobacco: Never Used  Substance Use Topics  . Alcohol use: No    Comment: rarely  . Drug use: Yes    Types: Marijuana    Review of Systems Constitutional: No fever/chills Eyes: No visual changes. ENT: No sore throat. Cardiovascular: Denies chest pain. Respiratory: Denies shortness of breath. Gastrointestinal: No abdominal pain.  No nausea, no vomiting.  No diarrhea.  No constipation. Genitourinary: Negative for dysuria. Musculoskeletal: Negative for neck pain.  Negative for back pain. Integumentary: Negative for rash. Neurological: Negative for headaches, focal weakness or numbness. Psychiatric:  Positive for "anxiety"  ____________________________________________   PHYSICAL EXAM:  VITAL SIGNS: ED Triage Vitals  Enc Vitals Group     BP 12/27/19 2235 (!) 124/105     Pulse Rate 12/27/19 2235 83     Resp 12/27/19 2235 20     Temp 12/27/19 2235 98.5 F (36.9 C)     Temp Source 12/27/19 2235 Oral     SpO2 12/27/19 2235 100 %     Weight 12/27/19 2236 68 kg (150 lb)     Height 12/27/19 2236 1.829 m (6')     Head Circumference --      Peak Flow --      Pain Score --      Pain  Loc --      Pain Edu? --      Excl. in GC? --     Constitutional: Alert and oriented.  Eyes: Conjunctivae are normal.  Head: Atraumatic.Marland Kitchen Mouth/Throat: Patient is wearing a mask. Neck: No stridor.  No meningeal signs.   Cardiovascular: Normal rate, regular rhythm. Good peripheral circulation. Grossly normal heart sounds. Respiratory: Normal respiratory effort.  No retractions. Gastrointestinal: Soft and nontender. No distention.  Musculoskeletal: No lower extremity tenderness nor edema. No gross deformities of extremities. Neurologic:  Normal speech and language. No gross focal neurologic deficits are appreciated.  Skin:  Skin is warm,  dry and intact. Psychiatric: Anxious affect speech and behavior are normal.  ____________________________________________   LABS (all labs ordered are listed, but only abnormal results are displayed)  Labs Reviewed  URINE DRUG SCREEN, QUALITATIVE ([facility] ONLY) - Abnormal; Notable for the following components:      Result Value   Cannabinoid 50 Ng, Ur [place] POSITIVE (*)    All other components within normal limits   ____________________________________________  EKG  ED ECG REPORT I, [place] Yardley Beltran, the attending physician, personally viewed and interpreted this ECG.   Date: 12/27/2019  EKG Time: 5:35 PM  Rate: 79  Rhythm: Normal sinus rhythm  Axis: Normal  Intervals: Normal  ST&T Change: Inferior lateral T wave inversion unchanged in comparison to EKG from 12/15/2018  ____________________________________________  RADIOLOGY I, [place] N Mckennah Kretchmer, personally viewed and evaluated these images (plain radiographs) as part of my medical decision making, as well as reviewing the written report by the radiologist.  ED MD interpretation: No active cardiopulmonary disease noted on chest x-ray per radiologist.  Official radiology report(s): DG Chest 2 View  Result Date: 12/27/2019 CLINICAL DATA:  Midsternal chest pain, short of breath, lightheadedness EXAM: CHEST - 2 VIEW COMPARISON:  None. FINDINGS: The heart size and mediastinal contours are within normal limits. Both lungs are clear. The visualized skeletal structures are unremarkable. IMPRESSION: No active cardiopulmonary disease. Electronically Signed   By: Sharlet Salina M.D.   On: 12/27/2019 18:19    ____________________________________________    Procedures   ____________________________________________   INITIAL IMPRESSION / MDM / ASSESSMENT AND PLAN / ED COURSE  As part of my medical decision making, I reviewed the following data within the electronic MEDICAL RECORD NUMBER  34 year old male presented with above-stated  history and physical exam secondary to feelings of anxiety.  Patient denies any chest pain or shortness of breath.  High-sensitivity troponin was indeed elevated at 45 and repeat value remain the same at 45.  Patient again without chest pain or shortness of breath.  Patient does however continue to admit to feelings of anxiety.  Awaiting psychiatry consultation.  Patient denies any suicidal or homicidal ideation.  ____________________________________________  FINAL CLINICAL IMPRESSION(S) / ED DIAGNOSES  Final diagnoses:  Anxiety     MEDICATIONS GIVEN DURING THIS VISIT:  Medications  LORazepam (ATIVAN) tablet 1 mg (has no administration in time range)     ED Discharge Orders    None      *Please note:  Joshua Shaffer. was evaluated in Emergency Department on 12/27/2019 for the symptoms described in the history of present illness. He was evaluated in the context of the global COVID-19 pandemic, which necessitated consideration that the patient might be at risk for infection with the SARS-CoV-2 virus that causes COVID-19. Institutional protocols and algorithms that pertain to the evaluation of patients at risk for COVID-19 are in a state of rapid  change based on information released by regulatory bodies including the CDC and federal and state organizations. These policies and algorithms were followed during the patient's care in the ED.  Some ED evaluations and interventions may be delayed as a result of limited staffing during the pandemic.*  Note:  This document was prepared using Dragon voice recognition software and may include unintentional dictation errors.   Darci Current, MD 12/28/19 2203

## 2019-12-27 NOTE — ED Notes (Signed)
Pt given blanket and water.

## 2019-12-28 LAB — RESPIRATORY PANEL BY RT PCR (FLU A&B, COVID)
Influenza A by PCR: NEGATIVE
Influenza B by PCR: NEGATIVE
SARS Coronavirus 2 by RT PCR: NEGATIVE

## 2019-12-28 LAB — TROPONIN I (HIGH SENSITIVITY): Troponin I (High Sensitivity): 45 ng/L — ABNORMAL HIGH (ref <18)

## 2019-12-28 MED ORDER — LORAZEPAM 1 MG PO TABS
1.0000 mg | ORAL_TABLET | Freq: Every evening | ORAL | Status: DC | PRN
  Administered 2019-12-28: 1 mg via ORAL
  Filled 2019-12-28: qty 1

## 2019-12-28 NOTE — ED Notes (Addendum)
Pt cooperative with assessment. Denies SI/HI/AVH on assessment. Pt did admit he is not feeling like himself and has been having negative thoughts about himself lately. Denies pain. Per plan pt to be admitted to for inpatient evaluation. Transferred to [facility] awaiting orders. Report given to oncoming staff.

## 2019-12-28 NOTE — Consult Note (Signed)
[facility] Psychiatry Consult   Reason for Consult:  Anxiety Referring Physician: ED MD Patient Identification: Joshua Shaffer. MRN:  [id number] Principal Diagnosis: Acute psychosis ([place]) Diagnosis:  Principal Problem:   Acute psychosis ([place])   Total Time spent with patient: 20 minutes  Subjective:   Joshua Vanriper. is a 34 y.o. male patient admitted with complaints of panic type anxiety  HPI: The patient presented to the nursing staff with the primary complaint of anxiety panic nature and feeling of fullness in his chest.  My interview reveals far more symptoms which are at the core of his anxiety.  He notes that recently he has been fearful of others and that others may have a plan to harm him.  He states that he has inability to hear other people's thoughts and those thoughts are always negative about him.  In addition he notes that he has been hearing voices which also are negative about him occasionally would tell him to hurt himself.  He currently denies that he has had a plan to hurt himself or that he took action based on the voices or the other thoughts though he is concerned about that.    He states that in the last month he has felt overly medicated and the decision was made to cut back on some of his antipsychotic medications.  This has decreased the sense of being overmedicated however he temporaly associates the increase in the above symptoms with the decrease in his antipsychotic medications.  He currently only describes the above psychotic symptoms and says that this is not related to depression and that he is not currently suffering from depressive symptoms.  His anxiety is specifically based on the voices and thoughts of others.  There is no evidence of mania and no evidence of his being an intentional risk to himself or others.  However his psychotic process places himself and others at risk.  Past Psychiatric History: He has a history of major depression with  psychosis and is treated by psychiatrist as an outpatient.  Risk to Self:   Risk to Others:   Prior Inpatient Therapy:   Prior Outpatient Therapy:    Past Medical History:  Past Medical History:  Diagnosis Date  . Dizziness     Past Surgical History:  Procedure Laterality Date  . TONSILLECTOMY     Family History:  Family History  Problem Relation Age of Onset  . Lung cancer Maternal Grandfather    Family Psychiatric  History:  Social History:  Social History   Substance and Sexual Activity  Alcohol Use No   Comment: rarely     Social History   Substance and Sexual Activity  Drug Use Yes  . Types: Marijuana    Social History   Socioeconomic History  . Marital status: Married    Spouse name: Not on file  . Number of children: Not on file  . Years of education: Not on file  . Highest education level: Not on file  Occupational History  . Not on file  Tobacco Use  . Smoking status: Current Every Day Smoker    Packs/day: 0.50    Years: 14.00    Pack years: 7.00    Types: Cigarettes  . Smokeless tobacco: Never Used  Substance and Sexual Activity  . Alcohol use: No    Comment: rarely  . Drug use: Yes    Types: Marijuana  . Sexual activity: Not on file  Other Topics Concern  .  Not on file  Social History Narrative  . Not on file   Social Determinants of Health   Financial Resource Strain:   . Difficulty of Paying Living Expenses:   Food Insecurity:   . Worried About Programme researcher, broadcasting/film/video in the Last Year:   . Barista in the Last Year:   Transportation Needs:   . Freight forwarder (Medical):   Marland Kitchen Lack of Transportation (Non-Medical):   Physical Activity:   . Days of Exercise per Week:   . Minutes of Exercise per Session:   Stress:   . Feeling of Stress :   Social Connections:   . Frequency of Communication with Friends and Family:   . Frequency of Social Gatherings with Friends and Family:   . Attends Religious Services:   . Active  Member of Clubs or Organizations:   . Attends Banker Meetings:   Marland Kitchen Marital Status:    Additional Social History:    Allergies:  No Known Allergies  Labs:  Results for orders placed or performed during the hospital encounter of 12/27/19 (from the past 48 hour(s))  Urine Drug Screen, Qualitative ([facility] only)     Status: Abnormal   Collection Time: 12/27/19 10:40 PM  Result Value Ref Range   Tricyclic, Ur Screen NONE DETECTED NONE DETECTED   Amphetamines, Ur Screen NONE DETECTED NONE DETECTED   MDMA (Ecstasy)Ur Screen NONE DETECTED NONE DETECTED   Cocaine Metabolite,Ur [place] NONE DETECTED NONE DETECTED   Opiate, Ur Screen NONE DETECTED NONE DETECTED   Phencyclidine (PCP) Ur S NONE DETECTED NONE DETECTED   Cannabinoid 50 Ng, Ur [place] POSITIVE (A) NONE DETECTED   Barbiturates, Ur Screen NONE DETECTED NONE DETECTED   Benzodiazepine, Ur Scrn NONE DETECTED NONE DETECTED   Methadone Scn, Ur NONE DETECTED NONE DETECTED    Comment: (NOTE) Tricyclics + metabolites, urine    Cutoff 1000 ng/mL Amphetamines + metabolites, urine  Cutoff 1000 ng/mL MDMA (Ecstasy), urine              Cutoff 500 ng/mL Cocaine Metabolite, urine          Cutoff 300 ng/mL Opiate + metabolites, urine        Cutoff 300 ng/mL Phencyclidine (PCP), urine         Cutoff 25 ng/mL Cannabinoid, urine                 Cutoff 50 ng/mL Barbiturates + metabolites, urine  Cutoff 200 ng/mL Benzodiazepine, urine              Cutoff 200 ng/mL Methadone, urine                   Cutoff 300 ng/mL The urine drug screen provides only a preliminary, unconfirmed analytical test result and should not be used for non-medical purposes. Clinical consideration and professional judgment should be applied to any positive drug screen result due to possible interfering substances. A more specific alternate chemical method must be used in order to obtain a confirmed analytical result. Gas chromatography / mass spectrometry (GC/MS) is the  preferred confirmat ory method. Performed at [facility], [address] [place], [place] [address]     No current facility-administered medications for this encounter.   Current Outpatient Medications  Medication Sig Dispense Refill  . ondansetron (ZOFRAN) 4 MG tablet Take 1 tablet (4 mg total) by mouth every 8 (eight) hours as needed. 20 tablet 0  . PARoxetine (PAXIL) 20 MG  tablet Take 1 tablet (20 mg total) by mouth daily. 30 tablet 1  . QUEtiapine (SEROQUEL) 300 MG tablet Take 1 tablet (300 mg total) by mouth at bedtime. 30 tablet 1    Psychiatric Specialty Exam: Physical Exam  Review of Systems  Blood pressure 114/77, pulse 79, temperature 98.7 F (37.1 C), temperature source Oral, resp. rate 17, height 6' (1.829 m), weight 68 kg, SpO2 97 %.Body mass index is 20.34 kg/m.  General Appearance: Casual  Eye Contact:  Fair  Speech:  Normal Rate  Volume:  Decreased  Mood:  Anxious and Fearful  Affect:  Constricted  Thought Process:  Coherent  Orientation:  Full (Time, Place, and Person)  Thought Content:  Delusions, Hallucinations: Auditory, Ideas of Reference:   Paranoia and Paranoid Ideation  Suicidal Thoughts:  No  Homicidal Thoughts:  No  Memory:  Immediate;   Fair  Judgement:  Fair  Insight:  Fair  Psychomotor Activity:  Normal  Concentration:  Concentration: Fair  Recall:  [employer] of Knowledge:  Fair  Language:  Fair  Akathisia:  No  Handed:  Ambidextrous  AIMS (if indicated):     Assets:  Desire for Improvement Physical Health Social Support  ADL's:  Intact  Cognition:  Impaired,  Moderate  Sleep:      Much of the anxiety appears to be related to paranoia and a primary psychotic process.  Treatment Plan Summary: Daily contact with patient to assess and evaluate symptoms and progress in treatment and Medication management  Disposition: Recommend psychiatric Inpatient admission when medically cleared.  Cindy Hazy, MD 12/28/2019 12:38  PM

## 2019-12-28 NOTE — ED Notes (Signed)
VOL/ Consult completed/Plan to Admit to BMU/ Moved to [facility]

## 2019-12-28 NOTE — BH Assessment (Signed)
Assessment Note  Joshua Shaffer. is an 34 y.o. male who presents to the ER initially due to having a panic attacks. While in the ER, it was discovered his psychiatrist at [facility] made adjustments with his medications. She started reducing the dosage. Now he is hearing voices again and "they don't' like me." His sleep has decreased, as well as his appetite. He sleeps for approximately thirty minutes before he's up again. Patient denies SI/HI and V/H. However, the voices are increasing and he is unable to manage them. Yesterday (12/27/2019) he felt like he was going to "jump out my skin" and was unable to remain in one place. He was restless.  During the interview, the patient was calm, cooperative and pleasant. He was able to provide appropriate answers to the questions. He admits to the use of cannabis, alcohol and cocaine. He's cocaine use was approximately three years ago and the cannabis & alcohol was yesterday (12/27/2019).  Diagnosis: Acute Psychosis  Past Medical History:  Past Medical History:  Diagnosis Date  . Dizziness     Past Surgical History:  Procedure Laterality Date  . TONSILLECTOMY      Family History:  Family History  Problem Relation Age of Onset  . Lung cancer Maternal Grandfather     Social History:  reports that he has been smoking cigarettes. He has a 7.00 pack-year smoking history. He has never used smokeless tobacco. He reports current drug use. Drug: Marijuana. He reports that he does not drink alcohol.  Additional Social History:  Alcohol / Drug Use Pain Medications: See PTA Prescriptions: See PTA Over the Counter: See PTA History of alcohol / drug use?: Yes Longest period of sobriety (when/how long): Unable to quantify Substance #1 Name of Substance 1: Alcohol 1 - Last Use / Amount: "Last night (12/27/2019)" Substance #2 Name of Substance 2: Cannabis 2 - Last Use / Amount: "Last night (12/27/2019)" Substance #3 Name of Substance 3: Cocaine 3 - Last  Use / Amount: "Three years ago"  CIWA: CIWA-Ar BP: 114/77 Pulse Rate: 79 COWS:    Allergies: No Known Allergies  Home Medications: (Not in a hospital admission)   OB/GYN Status:  No LMP for male patient.  General Assessment Data Location of Assessment: [facility] ED TTS Assessment: In system Is this a Tele or Face-to-Face Assessment?: Face-to-Face Is this an Initial Assessment or a Re-assessment for this encounter?: Initial Assessment Patient Accompanied by:: N/A Language Other than English: No What gender do you identify as?: Male Marital status: Married [place] name: n/a Pregnancy Status: No Living Arrangements: Spouse/significant other, Children Can pt return to current living arrangement?: Yes Admission Status: Voluntary Is patient capable of signing voluntary admission?: Yes Referral Source: Self/Family/Friend Insurance type: None  Medical Screening Exam [facility] Walk-in ONLY) Medical Exam completed: Yes  Crisis Care Plan Living Arrangements: Spouse/significant other, Children Legal Guardian: Other:(Self) Name of Psychiatrist: Dr. Georjean Mode, [facility] Name of Therapist: [facility]  Education Status Is patient currently in school?: No Is the patient employed, unemployed or receiving disability?: Employed  Risk to self with the past 6 months Suicidal Ideation: No Has patient been a risk to self within the past 6 months prior to admission? : No Suicidal Intent: No Has patient had any suicidal intent within the past 6 months prior to admission? : No Is patient at risk for suicide?: No Suicidal Plan?: No Has patient had any suicidal plan within the past 6 months prior to admission? : No Access to Means: No What has been  your use of drugs/alcohol within the last 12 months?: Cocaine, Alcohol and Cannabis Previous Attempts/Gestures: No How many times?: 0 Other Self Harm Risks: Repots of none Triggers for Past Attempts: None known Intentional Self Injurious Behavior: None Family Suicide  History: Unknown Recent stressful life event(s): Other (Comment)(Hearing voices) Persecutory voices/beliefs?: No Depression: Yes Depression Symptoms: Insomnia, Feeling worthless/self pity Substance abuse history and/or treatment for substance abuse?: Yes Suicide prevention information given to non-admitted patients: Not applicable  Risk to Others within the past 6 months Homicidal Ideation: No Does patient have any lifetime risk of violence toward others beyond the six months prior to admission? : No Thoughts of Harm to Others: No Current Homicidal Intent: No Current Homicidal Plan: No Access to Homicidal Means: No Identified Victim: Reports of none History of harm to others?: No Assessment of Violence: None Noted Violent Behavior Description: Reports of none Does patient have access to weapons?: No Criminal Charges Pending?: No Does patient have a court date: No Is patient on probation?: No  Psychosis Hallucinations: With command, Auditory Delusions: None noted  Mental Status Report Appearance/Hygiene: Unremarkable, In scrubs Eye Contact: Fair Motor Activity: Freedom of movement, Unremarkable Speech: Logical/coherent, Unremarkable Level of Consciousness: Alert Mood: Depressed, Anxious, Sad, Pleasant Affect: Anxious, Appropriate to circumstance, Sad Anxiety Level: Minimal Thought Processes: Coherent, Relevant Judgement: Unimpaired Orientation: Person, Place, Time, Situation, Appropriate for developmental age Obsessive Compulsive Thoughts/Behaviors: None  Cognitive Functioning Concentration: Decreased Memory: Recent Intact, Remote Intact Is patient IDD: No Insight: Fair Impulse Control: Fair Appetite: Fair Have you had any weight changes? : No Change Sleep: Decreased Total Hours of Sleep: 1 Vegetative Symptoms: None  ADLScreening [facility] Assessment Services) Patient's cognitive ability adequate to safely complete daily activities?: Yes Patient able to express need  for assistance with ADLs?: Yes Independently performs ADLs?: Yes (appropriate for developmental age)  Prior Inpatient Therapy Prior Inpatient Therapy: Yes Prior Therapy Dates: 12/2018 Prior Therapy Facilty/Provider(s): [facility] BMU Reason for Treatment: Acute Psychosis  Prior Outpatient Therapy Prior Outpatient Therapy: Yes Prior Therapy Dates: Current Prior Therapy Facilty/Provider(s): [facility] Reason for Treatment: Medication Management for Psychosis Does patient have an ACCT team?: No Does patient have Intensive In-House Services?  : No Does patient have [facility] services? : No Does patient have P4CC services?: No  ADL Screening (condition at time of admission) Patient's cognitive ability adequate to safely complete daily activities?: Yes Is the patient deaf or have difficulty hearing?: No Does the patient have difficulty seeing, even when wearing glasses/contacts?: No Does the patient have difficulty concentrating, remembering, or making decisions?: No Patient able to express need for assistance with ADLs?: Yes Does the patient have difficulty dressing or bathing?: No Independently performs ADLs?: Yes (appropriate for developmental age) Does the patient have difficulty walking or climbing stairs?: No Weakness of Legs: None Weakness of Arms/Hands: None  Home Assistive Devices/Equipment Home Assistive Devices/Equipment: None  Therapy Consults (therapy consults require a physician order) PT Evaluation Needed: No OT Evalulation Needed: No SLP Evaluation Needed: No Abuse/Neglect Assessment (Assessment to be complete while patient is alone) Abuse/Neglect Assessment Can Be Completed: Yes Physical Abuse: Denies Verbal Abuse: Denies Sexual Abuse: Denies Exploitation of patient/patient's resources: Denies Self-Neglect: Denies Values / Beliefs Cultural Requests During Hospitalization: None Spiritual Requests During Hospitalization: None Consults Spiritual Care Consult Needed:  No Transition of Care Team Consult Needed: No Advance Directives (For Healthcare) Does Patient Have a Medical Advance Directive?: No  Child/Adolescent Assessment Running Away Risk: Denies(Patient is adult)  Disposition:  Disposition Initial Assessment Completed for this  Encounter: Yes  On Site Evaluation by:   Reviewed with Physician:    Gunnar Fusi MS, LCAS, [facility], [place] Therapeutic Triage Specialist 12/28/2019 2:31 PM

## 2019-12-28 NOTE — BH Assessment (Signed)
Patient is to be admitted to [facility] BMU by Dr. Burgess Estelle.  Attending Physician will be Dr. Toni Amend.   Patient has been assigned to room 316, by [facility] Charge Nurse Megan.   Intake Paper Work has been signed and placed on patient chart.  ER staff is aware of the admission:  Nitchia, ER Secretary    Dr. Colon Branch, ER MD   Amy T., Patient's Nurse   Tho, Patient Access.

## 2019-12-28 NOTE — ED Notes (Signed)
VOL, to be admitted to [facility] BMU

## 2019-12-28 NOTE — ED Provider Notes (Signed)
Emergency Medicine Observation Re-evaluation Note  Jayshun Galentine. is a 34 y.o. male, seen on rounds today.  Pt initially presented to the ED for complaints of Behavior Problem Currently, the patient is resting.  Physical Exam  BP (!) 124/105 (BP Location: Left Arm)   Pulse 83   Temp 98.5 F (36.9 C) (Oral)   Resp 20   Ht 6' (1.829 m)   Wt 68 kg   SpO2 100%   BMI 20.34 kg/m   ED Course / MDM  I have reviewed the labs performed to date as well as medications administered while in observation.  Recent changes in the last 24 hours include given 1 mg PO Ativan overnight for anxiety. UDS positive for cannabis, which he did disclose.   Plan  Current plan is awaiting TTS/psychiatry evaluation and recommendations. Patient is under full IVC at this time. Patient presented voluntarily.    Miguel Aschoff., MD 12/28/19 [phone]

## 2019-12-29 ENCOUNTER — Encounter: Payer: Self-pay | Admitting: Psychiatry

## 2019-12-29 ENCOUNTER — Other Ambulatory Visit: Payer: Self-pay

## 2019-12-29 ENCOUNTER — Inpatient Hospital Stay
Admission: RE | Admit: 2019-12-29 | Discharge: 2020-01-01 | DRG: 885 | Disposition: A | Discharge status: Home / Self Care | Payer: No Typology Code available for payment source | Source: Intra-hospital | Attending: Psychiatry | Admitting: Psychiatry

## 2019-12-29 DIAGNOSIS — F323 Major depressive disorder, single episode, severe with psychotic features: Secondary | ICD-10-CM | POA: Diagnosis present

## 2019-12-29 DIAGNOSIS — Z801 Family history of malignant neoplasm of trachea, bronchus and lung: Secondary | ICD-10-CM

## 2019-12-29 DIAGNOSIS — F1290 Cannabis use, unspecified, uncomplicated: Secondary | ICD-10-CM | POA: Diagnosis present

## 2019-12-29 DIAGNOSIS — R000 Tachycardia, unspecified: Secondary | ICD-10-CM | POA: Diagnosis not present

## 2019-12-29 DIAGNOSIS — K5900 Constipation, unspecified: Secondary | ICD-10-CM | POA: Diagnosis present

## 2019-12-29 DIAGNOSIS — F209 Schizophrenia, unspecified: Secondary | ICD-10-CM | POA: Insufficient documentation

## 2019-12-29 DIAGNOSIS — G4700 Insomnia, unspecified: Secondary | ICD-10-CM | POA: Diagnosis present

## 2019-12-29 DIAGNOSIS — F23 Brief psychotic disorder: Secondary | ICD-10-CM | POA: Diagnosis present

## 2019-12-29 DIAGNOSIS — Z79899 Other long term (current) drug therapy: Secondary | ICD-10-CM | POA: Diagnosis not present

## 2019-12-29 DIAGNOSIS — F410 Panic disorder [episodic paroxysmal anxiety] without agoraphobia: Secondary | ICD-10-CM | POA: Diagnosis present

## 2019-12-29 DIAGNOSIS — F17210 Nicotine dependence, cigarettes, uncomplicated: Secondary | ICD-10-CM | POA: Diagnosis present

## 2019-12-29 MED ORDER — DOCUSATE SODIUM 100 MG PO CAPS
100.0000 mg | ORAL_CAPSULE | Freq: Two times a day (BID) | ORAL | Status: DC
  Administered 2019-12-29 – 2020-01-01 (×6): 100 mg via ORAL
  Filled 2019-12-29 (×6): qty 1

## 2019-12-29 MED ORDER — MAGNESIUM HYDROXIDE 400 MG/5ML PO SUSP: 30.0000 mL | Freq: Every day | ORAL | Status: DC | PRN

## 2019-12-29 MED ORDER — ALUM & MAG HYDROXIDE-SIMETH 200-200-20 MG/5ML PO SUSP: 30.0000 mL | ORAL | Status: DC | PRN

## 2019-12-29 MED ORDER — ACETAMINOPHEN 325 MG PO TABS
650.0000 mg | ORAL_TABLET | Freq: Four times a day (QID) | ORAL | Status: DC | PRN
  Administered 2019-12-30: 650 mg via ORAL
  Filled 2019-12-29: qty 2

## 2019-12-29 MED ORDER — PAROXETINE HCL 20 MG PO TABS
20.0000 mg | ORAL_TABLET | Freq: Every day | ORAL | Status: DC
  Administered 2019-12-29 – 2020-01-01 (×4): 20 mg via ORAL
  Filled 2019-12-29 (×5): qty 1

## 2019-12-29 MED ORDER — QUETIAPINE FUMARATE 200 MG PO TABS: 300.0000 mg | ORAL_TABLET | Freq: Every day | ORAL | Status: DC

## 2019-12-29 MED ORDER — QUETIAPINE FUMARATE 200 MG PO TABS
200.0000 mg | ORAL_TABLET | Freq: Every day | ORAL | Status: DC
  Administered 2019-12-29 – 2019-12-31 (×3): 200 mg via ORAL
  Filled 2019-12-29 (×2): qty 1

## 2019-12-29 MED ORDER — QUETIAPINE FUMARATE 100 MG PO TABS
100.0000 mg | ORAL_TABLET | Freq: Every day | ORAL | Status: DC
  Administered 2019-12-29 – 2020-01-01 (×4): 100 mg via ORAL
  Filled 2019-12-29 (×4): qty 1

## 2019-12-29 MED ORDER — ONDANSETRON HCL 4 MG PO TABS
4.0000 mg | ORAL_TABLET | Freq: Three times a day (TID) | ORAL | Status: DC | PRN
  Administered 2020-01-01: 4 mg via ORAL
  Filled 2019-12-29: qty 1

## 2019-12-29 NOTE — ED Provider Notes (Signed)
Emergency Medicine Observation Re-evaluation Note  Joshua Shaffer. is a 34 y.o. male, seen on rounds today.  Pt initially presented to the ED for complaints of Behavior Problem Currently, the patient is resting in no acute distress.  Physical Exam  BP 126/75 (BP Location: Right Arm)   Pulse 82   Temp 98.1 F (36.7 C) (Oral)   Resp 16   Ht 6' (1.829 m)   Wt 68 kg   SpO2 98%   BMI 20.34 kg/m  Physical Exam  ED Course / MDM  EKG:    I have reviewed the labs performed to date as well as medications administered while in observation.  No events overnight.   Plan  Current plan is for psychiatric disposition. Patient is not under full IVC at this time.   Irean Hong, MD 12/29/19 [phone]

## 2019-12-29 NOTE — Tx Team (Signed)
Initial Treatment Plan 12/29/2019 6:49 PM Joshua Shaffer. NGW:[id number]    PATIENT STRESSORS: Financial difficulties Medication change or noncompliance   PATIENT STRENGTHS: Average or above average intelligence Communication skills Physical Health Supportive family/friends   PATIENT IDENTIFIED PROBLEMS: Depression  Anxiety                   DISCHARGE CRITERIA:  Ability to meet basic life and health needs Adequate post-discharge living arrangements Improved stabilization in mood, thinking, and/or behavior Verbal commitment to aftercare and medication compliance  PRELIMINARY DISCHARGE PLAN: Attend aftercare/continuing care group Return to previous living arrangement  PATIENT/FAMILY INVOLVEMENT: This treatment plan has been presented to and reviewed with the patient, Joshua Shaffer., and/or family member,  The patient and family have been given the opportunity to ask questions and make suggestions.  Leonarda Salon, RN 12/29/2019, 6:49 PM

## 2019-12-29 NOTE — Progress Notes (Signed)
Patient admitted from ED with depression and anxiety attack.Patient pleasant and cooperative during admission assessment. Patient denies SI/HI at this time. Patient denies AVH. Patient informed of fall risk status, fall risk assessed "low" at this time.Patient lives with his wife and 3 kids. Patient oriented to unit/staff/room. Patient denies any questions/concerns at this time. Patient safe on unit with Q15 minute checks for safety. Skin assessment and body search done.No contraband found.

## 2019-12-29 NOTE — BHH Suicide Risk Assessment (Signed)
[facility] Admission Suicide Risk Assessment   Nursing information obtained from:    Demographic factors:    Current Mental Status:    Loss Factors:    Historical Factors:    Risk Reduction Factors:     Total Time spent with patient: 1 hour Principal Problem: Acute psychosis ([place]) Diagnosis:  Principal Problem:   Acute psychosis ([place]) Active Problems:   Panic attacks  Subjective Data: Patient seen chart reviewed.  This is a patient with a past history of anxiety and psychotic symptoms who came back to the emergency room with a reemergence of panic-like symptoms as well as paranoia and hallucinations and ideas of reference.  He denies having had any suicidal thoughts and there is no evidence of self-harm.  He shows good insight and is cooperative.  Continued Clinical Symptoms:  Alcohol Use Disorder Identification Test Final Score (AUDIT): 1 The "Alcohol Use Disorders Identification Test", Guidelines for Use in Primary Care, Second Edition.  World Pharmacologist [facility]). Score between 0-7:  no or low risk or alcohol related problems. Score between 8-15:  moderate risk of alcohol related problems. Score between 16-19:  high risk of alcohol related problems. Score 20 or above:  warrants further diagnostic evaluation for alcohol dependence and treatment.   CLINICAL FACTORS:   Panic Attacks Schizophrenia:   Depressive state   Musculoskeletal: Strength & Muscle Tone: within normal limits Gait & Station: normal Patient leans: N/A  Psychiatric Specialty Exam: Physical Exam  Nursing note and vitals reviewed. Constitutional: He appears well-developed and well-nourished.  HENT:  Head: Normocephalic and atraumatic.  Eyes: Pupils are equal, round, and reactive to light. Conjunctivae are normal.  Cardiovascular: Regular rhythm and normal heart sounds.  Respiratory: Effort normal. No respiratory distress.  GI: Soft.  Musculoskeletal:        General: Normal range of motion.     Cervical  back: Normal range of motion.  Neurological: He is alert.  Skin: Skin is warm and dry.  Psychiatric: His speech is normal. Judgment normal. His mood appears anxious. His affect is blunt. He is withdrawn. Thought content is paranoid. Thought content is not delusional. Cognition and memory are impaired. He expresses no homicidal and no suicidal ideation.    Review of Systems  Constitutional: Negative.   HENT: Negative.   Eyes: Negative.   Respiratory: Negative.   Cardiovascular: Negative.   Gastrointestinal: Negative.   Musculoskeletal: Negative.   Skin: Negative.   Neurological: Negative.   Psychiatric/Behavioral: Positive for confusion, dysphoric mood and hallucinations. The patient is nervous/anxious.     Blood pressure 112/88, pulse 94, temperature 98.6 F (37 C), temperature source Oral, resp. rate 18, height 5\' 11"  (1.803 m), weight 70.3 kg, SpO2 100 %.Body mass index is 21.62 kg/m.  General Appearance: Casual  Eye Contact:  Fair  Speech:  Slow  Volume:  Decreased  Mood:  Anxious and Dysphoric  Affect:  Constricted  Thought Process:  Coherent  Orientation:  Full (Time, Place, and Person)  Thought Content:  Logical  Suicidal Thoughts:  No  Homicidal Thoughts:  No  Memory:  Immediate;   Fair Recent;   Fair Remote;   Fair  Judgement:  Fair  Insight:  Fair  Psychomotor Activity:  Decreased  Concentration:  Concentration: Fair  Recall:  [employer] of Knowledge:  Fair  Language:  Fair  Akathisia:  No  Handed:  Right  AIMS (if indicated):     Assets:  Desire for [place]  ADL's:  Intact  Cognition:  WNL  Sleep:         COGNITIVE FEATURES THAT CONTRIBUTE TO RISK:  None    SUICIDE RISK:   Minimal: No identifiable suicidal ideation.  Patients presenting with no risk factors but with morbid ruminations; may be classified as minimal risk based on the severity of the depressive symptoms  PLAN OF CARE: Restart  appropriate medication for panic and psychotic symptoms.  15-minute checks.  Review labs.  Engage in appropriate treatment on the ward.  Arrange for appropriate follow-up at [facility].  I certify that inpatient services furnished can reasonably be expected to improve the patient's condition.   Mordecai Rasmussen, MD 12/29/2019, 5:22 PM

## 2019-12-29 NOTE — H&P (Signed)
Psychiatric Admission Assessment Adult  Patient Identification: Joshua Shaffer. MRN:  [id number] Date of Evaluation:  12/29/2019 Chief Complaint:  Schizophrenia (HCC) [F20.9] Principal Diagnosis: Acute psychosis (HCC) Diagnosis:  Principal Problem:   Acute psychosis (HCC) Active Problems:   Panic attacks  History of Present Illness: Patient seen and chart reviewed.  This is a 34 year old gentleman with a past history of anxiety and psychotic symptoms who comes back to the emergency room yesterday with a return of panic-like symptoms and psychotic symptoms.  He tells me he had felt like he was having "a breakdown" appearing he was hearing voices and his thoughts were racing like crazy.  He was not sleeping for days at a time.  All of this was going on for about a week and was getting worse.  Patient denies that he was having any suicidal or homicidal thoughts but was feeling paranoid like other people were going to hurt him.  Sounds like he was having some ideas of reference as well.  He has had chronic stress for the last year being out of work but did not report a particular new stress.  In fact he was about to wrap up his legal problems with his DUI and was optimistic about being able to drive again soon.  Patient had stopped drinking and stop using drugs almost entirely for the past year and had not recently relapsed.  Yesterday he did smoke some marijuana in an attempt to make the symptoms better but it only made things worse and increased his paranoia.  He has been following up with [facility] and has been staying on medication.  When he was discharged last year he was on Paxil and Seroquel and it looks like he is continued on those medicines but it sounds like he has been somewhat dissatisfied with him feeling like the nightly Seroquel made him feel "like I was drowning".  His doctor apparently was trying to taper him off it and change to something else but he is not sure what the other medicine  was. Associated Signs/Symptoms: Depression Symptoms:  insomnia, difficulty concentrating, anxiety, panic attacks, (Hypo) Manic Symptoms:  none Anxiety Symptoms:  Excessive Worry, Panic Symptoms, Psychotic Symptoms:  Paranoia, PTSD Symptoms: Negative Total Time spent with patient: 1 hour  Past Psychiatric History: Patient had an admission almost exactly a year ago with the same symptoms.  He responded at that time to Paxil and Seroquel.  There is no past history of suicide attempts or violence.  He does have a past history of substance use problems but but says that he got that under control pretty much entirely this past year  Is the patient at risk to self? No.  Has the patient been a risk to self in the past 6 months? No.  Has the patient been a risk to self within the distant past? No.  Is the patient a risk to others? No.  Has the patient been a risk to others in the past 6 months? No.  Has the patient been a risk to others within the distant past? No.   Prior Inpatient Therapy:   Prior Outpatient Therapy:    Alcohol Screening: 1. How often do you have a drink containing alcohol?: Monthly or less 2. How many drinks containing alcohol do you have on a typical day when you are drinking?: 1 or 2 3. How often do you have six or more drinks on one occasion?: Never AUDIT-C Score: 1 4. How often during the last  year have you found that you were not able to stop drinking once you had started?: Never 5. How often during the last year have you failed to do what was normally expected from you becasue of drinking?: Never 6. How often during the last year have you needed a first drink in the morning to get yourself going after a heavy drinking session?: Never 7. How often during the last year have you had a feeling of guilt of remorse after drinking?: Never 8. How often during the last year have you been unable to remember what happened the night before because you had been drinking?:  Never 9. Have you or someone else been injured as a result of your drinking?: No 10. Has a relative or friend or a doctor or another health worker been concerned about your drinking or suggested you cut down?: No Alcohol Use Disorder Identification Test Final Score (AUDIT): 1 Alcohol Brief Interventions/Follow-up: AUDIT Score <7 follow-up not indicated Substance Abuse History in the last 12 months:  No. Consequences of Substance Abuse: Negative Previous Psychotropic Medications: Yes  Psychological Evaluations: Yes  Past Medical History:  Past Medical History:  Diagnosis Date  . Dizziness     Past Surgical History:  Procedure Laterality Date  . TONSILLECTOMY     Family History:  Family History  Problem Relation Age of Onset  . Lung cancer Maternal Grandfather    Family Psychiatric  History: None reported Tobacco Screening: Have you used any form of tobacco in the last 30 days? (Cigarettes, Smokeless Tobacco, Cigars, and/or Pipes): Yes Tobacco use, Select all that apply: 5 or more cigarettes per day Are you interested in Tobacco Cessation Medications?: Yes, will notify MD for an order Counseled patient on smoking cessation including recognizing danger situations, developing coping skills and basic information about quitting provided: Yes Social History:  Social History   Substance and Sexual Activity  Alcohol Use No   Comment: rarely     Social History   Substance and Sexual Activity  Drug Use Yes  . Types: Marijuana    Additional Social History:                           Allergies:  No Known Allergies Lab Results:  Results for orders placed or performed during the hospital encounter of 12/27/19 (from the past 48 hour(s))  Urine Drug Screen, Qualitative ([facility] only)     Status: Abnormal   Collection Time: 12/27/19 10:40 PM  Result Value Ref Range   Tricyclic, Ur Screen NONE DETECTED NONE DETECTED   Amphetamines, Ur Screen NONE DETECTED NONE DETECTED   MDMA  (Ecstasy)Ur Screen NONE DETECTED NONE DETECTED   Cocaine Metabolite,Ur [place] NONE DETECTED NONE DETECTED   Opiate, Ur Screen NONE DETECTED NONE DETECTED   Phencyclidine (PCP) Ur S NONE DETECTED NONE DETECTED   Cannabinoid 50 Ng, Ur [place] POSITIVE (A) NONE DETECTED   Barbiturates, Ur Screen NONE DETECTED NONE DETECTED   Benzodiazepine, Ur Scrn NONE DETECTED NONE DETECTED   Methadone Scn, Ur NONE DETECTED NONE DETECTED    Comment: (NOTE) Tricyclics + metabolites, urine    Cutoff 1000 ng/mL Amphetamines + metabolites, urine  Cutoff 1000 ng/mL MDMA (Ecstasy), urine              Cutoff 500 ng/mL Cocaine Metabolite, urine          Cutoff 300 ng/mL Opiate + metabolites, urine        Cutoff 300 ng/mL Phencyclidine (  PCP), urine         Cutoff 25 ng/mL Cannabinoid, urine                 Cutoff 50 ng/mL Barbiturates + metabolites, urine  Cutoff 200 ng/mL Benzodiazepine, urine              Cutoff 200 ng/mL Methadone, urine                   Cutoff 300 ng/mL The urine drug screen provides only a preliminary, unconfirmed analytical test result and should not be used for non-medical purposes. Clinical consideration and professional judgment should be applied to any positive drug screen result due to possible interfering substances. A more specific alternate chemical method must be used in order to obtain a confirmed analytical result. Gas chromatography / mass spectrometry (GC/MS) is the preferred confirmat ory method. Performed at [facility], [address] [place], [place] [address]   Respiratory Panel by RT PCR (Flu A&B, Covid) - Nasopharyngeal Swab     Status: None   Collection Time: 12/28/19 11:55 AM   Specimen: Nasopharyngeal Swab  Result Value Ref Range   SARS Coronavirus 2 by RT PCR NEGATIVE NEGATIVE    Comment: (NOTE) SARS-CoV-2 target nucleic acids are NOT DETECTED. The SARS-CoV-2 RNA is generally detectable in upper respiratoy specimens during the acute phase of infection.  The lowest concentration of SARS-CoV-2 viral copies this assay can detect is 131 copies/mL. A negative result does not preclude SARS-Cov-2 infection and should not be used as the sole basis for treatment or other patient management decisions. A negative result may occur with  improper specimen collection/handling, submission of specimen other than nasopharyngeal swab, presence of viral mutation(s) within the areas targeted by this assay, and inadequate number of viral copies (<131 copies/mL). A negative result must be combined with clinical observations, patient history, and epidemiological information. The expected result is Negative. Fact Sheet for Patients:  PinkCheek.be Fact Sheet for Healthcare Providers:  GravelBags.it This test is not yet ap proved or cleared by the [place] FDA and  has been authorized for detection and/or diagnosis of SARS-CoV-2 by FDA under an Emergency Use Authorization (EUA). This EUA will remain  in effect (meaning this test can be used) for the duration of the COVID-19 declaration under Section 564(b)(1) of the Act, 21 U.S.C. section 360bbb-3(b)(1), unless the authorization is terminated or revoked sooner.    Influenza A by PCR NEGATIVE NEGATIVE   Influenza B by PCR NEGATIVE NEGATIVE    Comment: (NOTE) The Xpert Xpress SARS-CoV-2/FLU/RSV assay is intended as an aid in  the diagnosis of influenza from Nasopharyngeal swab specimens and  should not be used as a sole basis for treatment. Nasal washings and  aspirates are unacceptable for Xpert Xpress SARS-CoV-2/FLU/RSV  testing. Fact Sheet for Patients: PinkCheek.be Fact Sheet for Healthcare Providers: GravelBags.it This test is not yet approved or cleared by the [place] FDA and  has been authorized for detection and/or diagnosis of SARS-CoV-2 by  FDA under an Emergency Use  Authorization (EUA). This EUA will remain  in effect (meaning this test can be used) for the duration of the  Covid-19 declaration under Section 564(b)(1) of the Act, 21  U.S.C. section 360bbb-3(b)(1), unless the authorization is  terminated or revoked. Performed at [facility], [address], [place], [place] [address]     Blood Alcohol level:  Lab Results  Component Value Date   [facility] <10 12/14/2018  Metabolic Disorder Labs:  Lab Results  Component Value Date   HGBA1C 5.5 12/14/2018   MPG 111.15 12/14/2018   No results found for: PROLACTIN Lab Results  Component Value Date   CHOL 91 12/14/2018   TRIG 92 12/14/2018   HDL 49 12/14/2018   CHOLHDL 1.9 12/14/2018   VLDL 18 12/14/2018   LDLCALC 24 12/14/2018    Current Medications: Current Facility-Administered Medications  Medication Dose Route Frequency Provider Last Rate Last Admin  . acetaminophen (TYLENOL) tablet 650 mg  650 mg Oral Q6H PRN Aalliyah Kilker T, MD      . alum & mag hydroxide-simeth (MAALOX/MYLANTA) 200-200-20 MG/5ML suspension 30 mL  30 mL Oral Q4H PRN Deseray Daponte T, MD      . docusate sodium (COLACE) capsule 100 mg  100 mg Oral BID Vester Titsworth T, MD      . magnesium hydroxide (MILK OF MAGNESIA) suspension 30 mL  30 mL Oral Daily PRN Lendon George T, MD      . ondansetron (ZOFRAN) tablet 4 mg  4 mg Oral Q8H PRN Jamacia Jester T, MD      . PARoxetine (PAXIL) tablet 20 mg  20 mg Oral Daily Boyde Grieco T, MD      . QUEtiapine (SEROQUEL) tablet 100 mg  100 mg Oral Daily Briony Parveen T, MD      . QUEtiapine (SEROQUEL) tablet 200 mg  200 mg Oral QHS Maliah Pyles T, MD       PTA Medications: Medications Prior to Admission  Medication Sig Dispense Refill Last Dose  . ondansetron (ZOFRAN) 4 MG tablet Take 1 tablet (4 mg total) by mouth every 8 (eight) hours as needed. 20 tablet 0   . PARoxetine (PAXIL) 20 MG tablet Take 1 tablet (20 mg total) by mouth daily. 30 tablet 1   . QUEtiapine  (SEROQUEL) 300 MG tablet Take 1 tablet (300 mg total) by mouth at bedtime. 30 tablet 1     Musculoskeletal: Strength & Muscle Tone: within normal limits Gait & Station: normal Patient leans: N/A  Psychiatric Specialty Exam: Physical Exam  Nursing note and vitals reviewed. Constitutional: He appears well-developed and well-nourished.  HENT:  Head: Normocephalic and atraumatic.  Eyes: Pupils are equal, round, and reactive to light. Conjunctivae are normal.  Cardiovascular: Regular rhythm and normal heart sounds.  Respiratory: Effort normal. No respiratory distress.  GI: Soft.  Musculoskeletal:        General: Normal range of motion.     Cervical back: Normal range of motion.  Neurological: He is alert.  Skin: Skin is warm and dry.  Psychiatric: Judgment normal. His mood appears anxious. His affect is blunt. His speech is delayed. He is slowed. Thought content is paranoid. Thought content is not delusional. Cognition and memory are normal. He expresses no homicidal and no suicidal ideation.    Review of Systems  Constitutional: Negative.   HENT: Negative.   Eyes: Negative.   Respiratory: Negative.   Cardiovascular: Negative.   Gastrointestinal: Negative.   Musculoskeletal: Negative.   Skin: Negative.   Neurological: Negative.   Psychiatric/Behavioral: Positive for dysphoric mood and hallucinations. The patient is nervous/anxious.     Blood pressure 112/88, pulse 94, temperature 98.6 F (37 C), temperature source Oral, resp. rate 18, height 5\' 11"  (1.803 m), weight 70.3 kg, SpO2 100 %.Body mass index is 21.62 kg/m.  General Appearance: Casual  Eye Contact:  Good  Speech:  Clear and Coherent  Volume:  Normal  Mood:  Euthymic  Affect:  Congruent  Thought Process:  Goal Directed  Orientation:  Full (Time, Place, and Person)  Thought Content:  Logical  Suicidal Thoughts:  No  Homicidal Thoughts:  No  Memory:  Immediate;   Fair Recent;   Fair Remote;   Fair  Judgement:   Fair  Insight:  Fair  Psychomotor Activity:  Normal  Concentration:  Concentration: Fair  Recall:  [employer] of Knowledge:  Fair  Language:  Fair  Akathisia:  No  Handed:  Right  AIMS (if indicated):     Assets:  Desire for Improvement  ADL's:  Intact  Cognition:  WNL  Sleep:       Treatment Plan Summary: Daily contact with patient to assess and evaluate symptoms and progress in treatment, Medication management and Plan Patient will be kept on 15-minute checks.  Restart Seroquel.  At his suggestion divide dose up to 100 in the morning and 200 at night.  Restart Paxil.  Daily evaluation and arrangements for appropriate follow-up once he is stable.  Observation Level/Precautions:  15 minute checks  Laboratory:  Chemistry Profile  Psychotherapy:    Medications:    Consultations:    Discharge Concerns:    Estimated LOS:  Other:     Physician Treatment Plan for Primary Diagnosis: Acute psychosis (HCC) Long Term Goal(s): Improvement in symptoms so as ready for discharge  Short Term Goals: Ability to verbalize feelings will improve, Ability to disclose and discuss suicidal ideas, Ability to demonstrate self-control will improve, Ability to maintain clinical measurements within normal limits will improve and Compliance with prescribed medications will improve  Physician Treatment Plan for Secondary Diagnosis: Principal Problem:   Acute psychosis (HCC) Active Problems:   Panic attacks  Long Term Goal(s): Improvement in symptoms so as ready for discharge  Short Term Goals: Ability to maintain clinical measurements within normal limits will improve  I certify that inpatient services furnished can reasonably be expected to improve the patient's condition.    Mordecai Rasmussen, MD 4/23/20215:25 PM

## 2019-12-29 NOTE — ED Notes (Signed)
Pt discharged to BMU. Pt is voluntary. Consent form is signed. VS stable. Pt is calm and cooperative. Belongings sent with pt.

## 2019-12-30 NOTE — Progress Notes (Signed)
[facility] MD Progress Note  12/30/2019 2:03 PM Joshua Shaffer.  MRN:  [id number]   Joshua Shaffer is a 34yo M with a history of Schizophrenia, who was admitted to [facility] unit for psychosis and panic attacks.  Patient seen.  Chart reviewed. Patient discussed with nursing; no overnight events reported.  Subjective:  Patient reports "feeling better". He says "no voices, they are gone", denies any hallucinations. Reports some improvement in racing thoughts as well. Denies unsafe thoughts. Reports "lucid dreams" from Paxil and sedation from Seroquel. Reports still feeling "like I have fear of doing some things and I cannot do them because of that" and that he is feeling safe in the hospital. Reports improved sleep.   Principal Problem: Acute psychosis ([place]) Diagnosis: Principal Problem:   Acute psychosis ([place]) Active Problems:   Panic attacks  Total Time spent with patient: 20 minutes  Past Psychiatric History: see H&P  Past Medical History:  Past Medical History:  Diagnosis Date  . Dizziness     Past Surgical History:  Procedure Laterality Date  . TONSILLECTOMY     Family History:  Family History  Problem Relation Age of Onset  . Lung cancer Maternal Grandfather    Family Psychiatric  History: see H&P Social History:  Social History   Substance and Sexual Activity  Alcohol Use No   Comment: rarely     Social History   Substance and Sexual Activity  Drug Use Yes  . Types: Marijuana    Social History   Socioeconomic History  . Marital status: Married    Spouse name: Not on file  . Number of children: Not on file  . Years of education: Not on file  . Highest education level: Not on file  Occupational History  . Not on file  Tobacco Use  . Smoking status: Current Every Day Smoker    Packs/day: 0.50    Years: 14.00    Pack years: 7.00    Types: Cigarettes  . Smokeless tobacco: Never Used  Substance and Sexual Activity  . Alcohol use: No    Comment: rarely  . Drug use:  Yes    Types: Marijuana  . Sexual activity: Not on file  Other Topics Concern  . Not on file  Social History Narrative  . Not on file   Social Determinants of Health   Financial Resource Strain:   . Difficulty of Paying Living Expenses:   Food Insecurity:   . Worried About Charity fundraiser in the Last Year:   . Arboriculturist in the Last Year:   Transportation Needs:   . Film/video editor (Medical):   Marland Kitchen Lack of Transportation (Non-Medical):   Physical Activity:   . Days of Exercise per Week:   . Minutes of Exercise per Session:   Stress:   . Feeling of Stress :   Social Connections:   . Frequency of Communication with Friends and Family:   . Frequency of Social Gatherings with Friends and Family:   . Attends Religious Services:   . Active Member of Clubs or Organizations:   . Attends Archivist Meetings:   Marland Kitchen Marital Status:    Additional Social History:                         Sleep: Good  Appetite:  Good  Current Medications: Current Facility-Administered Medications  Medication Dose Route Frequency Provider Last Rate Last Admin  . acetaminophen (  TYLENOL) tablet 650 mg  650 mg Oral Q6H PRN Clapacs, John T, MD      . alum & mag hydroxide-simeth (MAALOX/MYLANTA) 200-200-20 MG/5ML suspension 30 mL  30 mL Oral Q4H PRN Clapacs, John T, MD      . docusate sodium (COLACE) capsule 100 mg  100 mg Oral BID Clapacs, Jackquline Denmark, MD   100 mg at 12/30/19 0831  . magnesium hydroxide (MILK OF MAGNESIA) suspension 30 mL  30 mL Oral Daily PRN Clapacs, John T, MD      . ondansetron (ZOFRAN) tablet 4 mg  4 mg Oral Q8H PRN Clapacs, John T, MD      . PARoxetine (PAXIL) tablet 20 mg  20 mg Oral Daily Clapacs, Jackquline Denmark, MD   20 mg at 12/30/19 0831  . QUEtiapine (SEROQUEL) tablet 100 mg  100 mg Oral Daily Clapacs, Jackquline Denmark, MD   100 mg at 12/30/19 0831  . QUEtiapine (SEROQUEL) tablet 200 mg  200 mg Oral QHS Clapacs, John T, MD   200 mg at 12/29/19 2100    Lab Results:  No results found for this or any previous visit (from the past 48 hour(s)).  Blood Alcohol level:  Lab Results  Component Value Date   ETH <10 12/14/2018    Metabolic Disorder Labs: Lab Results  Component Value Date   HGBA1C 5.5 12/14/2018   MPG 111.15 12/14/2018   No results found for: PROLACTIN Lab Results  Component Value Date   CHOL 91 12/14/2018   TRIG 92 12/14/2018   HDL 49 12/14/2018   CHOLHDL 1.9 12/14/2018   VLDL 18 12/14/2018   LDLCALC 24 12/14/2018    Physical Findings: AIMS:  , ,  ,  ,    CIWA:    COWS:     Musculoskeletal: Strength & Muscle Tone: within normal limits Gait & Station: normal Patient leans: N/A  Psychiatric Specialty Exam: Physical Exam  Review of Systems  Blood pressure 125/89, pulse 80, temperature 98.3 F (36.8 C), temperature source Oral, resp. rate 18, height 5\' 11"  (1.803 m), weight 70.3 kg, SpO2 100 %.Body mass index is 21.62 kg/m.  General Appearance: Casual  Eye Contact:  Good  Speech:  Normal Rate  Volume:  Normal  Mood:  Anxious  Affect:  Constricted  Thought Process:  Coherent and Goal Directed  Orientation:  Full (Time, Place, and Person)  Thought Content:  Obsessions  Suicidal Thoughts:  No  Homicidal Thoughts:  No  Memory:  Immediate;   Fair Recent;   Fair Remote;   Fair  Judgement:  Fair  Insight:  Fair  Psychomotor Activity:  Normal  Concentration:  Concentration: Fair and Attention Span: Fair  Recall:  of Knowledge:  Fair  Language:  Fair  Akathisia:  No  Handed:  Right  AIMS (if indicated):     Assets:  Desire for Improvement  ADL's:  Intact  Cognition:  WNL  Sleep:  Number of Hours: 6.75     Treatment Plan Summary: Daily contact with patient to assess and evaluate symptoms and progress in treatment and Medication management   Patient reports mood improvement and no hallucinations after he was restarted on medications yesterday. Continues to report some paranoid fears. Denies side  effects from medications, except of vivid dreams from Paxil and sedation from Seroquel. Will continue medications without changes: Seroquel 100mg  in AM and 200mg  in HS; Paxil 20mg  daily.  Continue inpatient psych admission; 15-minute checks; daily contact with patient to assess  and evaluate symptoms and progress in treatment; psychoeducation.    Thalia Party, MD 12/30/2019, 2:03 PM
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
## 2019-12-30 NOTE — BHH Group Notes (Signed)
LCSW Group Therapy Note  12/30/2019 5:01 PM  Type of Therapy/Topic:  Group Therapy:  Balance in Life  Participation Level:  Did Not Attend  Description of Group:    This group will address the concept of balance and how it feels and looks when one is unbalanced. Patients will be encouraged to process areas in their lives that are out of balance and identify reasons for remaining unbalanced. Facilitators will guide patients in utilizing problem-solving interventions to address and correct the stressor making their life unbalanced. Understanding and applying boundaries will be explored and addressed for obtaining and maintaining a balanced life. Patients will be encouraged to explore ways to assertively make their unbalanced needs known to significant others in their lives, using other group members and facilitator for support and feedback.  Therapeutic Goals: 1. Patient will identify two or more emotions or situations they have that consume much of in their lives. 2. Patient will identify signs/triggers that life has become out of balance:  3. Patient will identify two ways to set boundaries in order to achieve balance in their lives:  4. Patient will demonstrate ability to communicate their needs through discussion and/or role plays  Summary of Patient Progress:   X   Therapeutic Modalities:   Cognitive Behavioral Therapy Solution-Focused Therapy Assertiveness Training  Teresita Madura MSW, [place] Clinical Social Work 12/30/2019 5:01 PM

## 2019-12-30 NOTE — Plan of Care (Signed)
The patient is calm and cooperative.  Problem: Education: Goal: Emotional status will improve Outcome: Progressing Goal: Mental status will improve Outcome: Progressing   

## 2019-12-30 NOTE — Progress Notes (Signed)
F - Decrease anxiety.    D - Xan ate breakfast and accepted ordered medications.  He remained in his room for the majority of the shift, but was able to adequately advocate for himself.  Dewell denied thoughts of harming himself and others.  No significant symptoms of anxiety observed.  The patient did not display symptoms of psychosis.  Mood was flat, but stable.   A - Medications were provided as ordered.  The patient was encouraged to join others in the milieu and to participate in any offered groups.      R - Continue with care.

## 2019-12-30 NOTE — Progress Notes (Signed)
Patient alert and oriented x 4, affect is blunted, his mood is receptive to staff, thoughts are organized no paranoia or bizarre behavior noted, he is isolated to his room, and currently denies SI/HI/AH, speech is soft non pressured he was complaint with night time medication regimen, 15 minutes safety checks maintained will continue to monitor.

## 2019-12-30 NOTE — Plan of Care (Signed)
  Problem: Education: Goal: Ability to state activities that reduce stress will improve Outcome: Progressing  Patient appears less anxious interacting appropriately with staff.

## 2019-12-31 NOTE — BHH Group Notes (Signed)
LCSW Group Therapy Notes  Date and Time: 12/31/19  Type of Therapy and Topic: Group Therapy: Healthy Vs. Unhealthy Coping Strategies  Participation Level: BHH PARTICIPATION LEVEL: Did Not Attend  Description of Group:  In this group, patients will be encouraged to explore their healthy and unhealthy coping strategics. Coping strategies are actions that we take to deal with stress, problems, or uncomfortable emotions in our daily lives. Each patient will be challenged to read some scenarios and discuss the unhealthy and healthy coping strategies within those scenarios. Also, each patient will be challenged to describe current healthy and unhealthy strategies that they use in their own lives and discuss the outcomes and barriers to those strategies. This group will be process-oriented, with patients participating in exploration of their own experiences as well as giving and receiving support and challenge from other group members.  Therapeutic Goals: 1. Patient will identify personal healthy and unhealthy coping strategies. 2. Patient will identify healthy and unhealthy coping strategies, in others, through scenarios.  3. Patient will identify expected outcomes of healthy and unhealthy coping strategies. 4. Patient will identify barriers to using healthy coping strategies.   Summary of Patient Progress:  X  Therapeutic Modalities:  Cognitive Behavioral Therapy Solution Focused Therapy Motivational Interviewing   Teresita Madura, MSW, [employer] Clinical Social Worker

## 2019-12-31 NOTE — Plan of Care (Signed)
Patient reports feeling less anxious and that his mood has improved, he was cooperative with treatment and medication compliant. He seems to be in bed resting quietly at this time.

## 2019-12-31 NOTE — Plan of Care (Signed)
Patient endorsing somatic delusions.  Problem: Education: Goal: Emotional status will improve Outcome: Progressing Goal: Mental status will improve Outcome: Progressing

## 2019-12-31 NOTE — Progress Notes (Signed)
[facility] MD Progress Note  12/31/2019 10:31 AM Joshua Shaffer.  MRN:  [id number]   Joshua Shaffer is a 34yo M with a history of Schizophrenia, who was admitted to [facility] unit for psychosis and panic attacks.  Patient seen.  Chart reviewed. Patient discussed with nursing; no overnight events reported.  Subjective:  Patient reports "I feel better". He says his thoughts are not racing, "mind slowed down". Reports no hallucinations. "I still overthinking though". Denies unsafe thoughts. Continues to report vivid dreams from Paxil and some sedation from Seroquel. He had one episode of tachycardia at 108bpm this AM. Reports "I feel good now" at the time of the interview.  Observed playing basketball later during the day. On treatment team meeting he reported "I feel good". He expressed his goal from current admission "to get back om meds, to be able to function and take care of my family".   Principal Problem: Acute psychosis ([facility]) Diagnosis: Principal Problem:   Acute psychosis ([facility]) Active Problems:   Panic attacks  Total Time spent with patient: 20 minutes  Past Psychiatric History: see H&P  Past Medical History:  Past Medical History:  Diagnosis Date  . Dizziness     Past Surgical History:  Procedure Laterality Date  . TONSILLECTOMY     Family History:  Family History  Problem Relation Age of Onset  . Lung cancer Maternal Grandfather    Family Psychiatric  History: see H&P Social History:  Social History   Substance and Sexual Activity  Alcohol Use No   Comment: rarely     Social History   Substance and Sexual Activity  Drug Use Yes  . Types: Marijuana    Social History   Socioeconomic History  . Marital status: Married    Spouse name: Not on file  . Number of children: Not on file  . Years of education: Not on file  . Highest education level: Not on file  Occupational History  . Not on file  Tobacco Use  . Smoking status: Current Every Day Smoker    Packs/day: 0.50     Years: 14.00    Pack years: 7.00    Types: Cigarettes  . Smokeless tobacco: Never Used  Substance and Sexual Activity  . Alcohol use: No    Comment: rarely  . Drug use: Yes    Types: Marijuana  . Sexual activity: Not on file  Other Topics Concern  . Not on file  Social History Narrative  . Not on file   Social Determinants of Health   Financial Resource Strain:   . Difficulty of Paying Living Expenses:   Food Insecurity:   . Worried About Programme researcher, broadcasting/film/video in the Last Year:   . Barista in the Last Year:   Transportation Needs:   . Freight forwarder (Medical):   Marland Kitchen Lack of Transportation (Non-Medical):   Physical Activity:   . Days of Exercise per Week:   . Minutes of Exercise per Session:   Stress:   . Feeling of Stress :   Social Connections:   . Frequency of Communication with Friends and Family:   . Frequency of Social Gatherings with Friends and Family:   . Attends Religious Services:   . Active Member of Clubs or Organizations:   . Attends Banker Meetings:   Marland Kitchen Marital Status:    Additional Social History:  Sleep: Good  Appetite:  Good  Current Medications: Current Facility-Administered Medications  Medication Dose Route Frequency Provider Last Rate Last Admin  . acetaminophen (TYLENOL) tablet 650 mg  650 mg Oral Q6H PRN Clapacs, Madie Reno, MD   650 mg at 12/30/19 2303  . alum & mag hydroxide-simeth (MAALOX/MYLANTA) 200-200-20 MG/5ML suspension 30 mL  30 mL Oral Q4H PRN Clapacs, John T, MD      . docusate sodium (COLACE) capsule 100 mg  100 mg Oral BID Clapacs, Madie Reno, MD   100 mg at 12/31/19 0806  . magnesium hydroxide (MILK OF MAGNESIA) suspension 30 mL  30 mL Oral Daily PRN Clapacs, John T, MD      . ondansetron (ZOFRAN) tablet 4 mg  4 mg Oral Q8H PRN Clapacs, John T, MD      . PARoxetine (PAXIL) tablet 20 mg  20 mg Oral Daily Clapacs, Madie Reno, MD   20 mg at 12/31/19 0806  . QUEtiapine (SEROQUEL)  tablet 100 mg  100 mg Oral Daily Clapacs, Madie Reno, MD   100 mg at 12/31/19 0806  . QUEtiapine (SEROQUEL) tablet 200 mg  200 mg Oral QHS Clapacs, Madie Reno, MD   200 mg at 12/30/19 2113    Lab Results: No results found for this or any previous visit (from the past 48 hour(s)).  Blood Alcohol level:  Lab Results  Component Value Date   ETH <10 59/56/3875    Metabolic Disorder Labs: Lab Results  Component Value Date   HGBA1C 5.5 12/14/2018   MPG 111.15 12/14/2018   No results found for: PROLACTIN Lab Results  Component Value Date   CHOL 91 12/14/2018   TRIG 92 12/14/2018   HDL 49 12/14/2018   CHOLHDL 1.9 12/14/2018   VLDL 18 12/14/2018   LDLCALC 24 12/14/2018    Physical Findings: AIMS:  , ,  ,  ,    CIWA:    COWS:     Musculoskeletal: Strength & Muscle Tone: within normal limits Gait & Station: normal Patient leans: N/A  Psychiatric Specialty Exam: Physical Exam   Review of Systems   Blood pressure 123/79, pulse (!) 107, temperature 98.4 F (36.9 C), temperature source Oral, resp. rate 16, height 5\' 11"  (1.803 m), weight 70.3 kg, SpO2 99 %.Body mass index is 21.62 kg/m.  General Appearance: Casual  Eye Contact:  Good  Speech:  Normal Rate  Volume:  Normal  Mood:  Anxious  Affect:  Constricted  Thought Process:  Coherent and Goal Directed  Orientation:  Full (Time, Place, and Person)  Thought Content:  Obsessions  Suicidal Thoughts:  No  Homicidal Thoughts:  No  Memory:  Immediate;   Fair Recent;   Fair Remote;   Fair  Judgement:  Fair  Insight:  Fair  Psychomotor Activity:  Normal  Concentration:  Concentration: Fair and Attention Span: Fair  Recall:  [employer] of Knowledge:  Fair  Language:  Fair  Akathisia:  No  Handed:  Right  AIMS (if indicated):     Assets:  Desire for Improvement  ADL's:  Intact  Cognition:  WNL  Sleep:  Number of Hours: 5     Treatment Plan Summary: Daily contact with patient to assess and evaluate symptoms and progress  in treatment and Medication management   Patient reports mood improvement and no hallucinations on current medications. Reports feeling less paranoid fears. Denies side effects from medications, except of vivid dreams from Paxil and sedation from Seroquel. Will continue medications  without changes: Seroquel 100mg  in AM and 200mg  in HS; Paxil 20mg  daily.  Continue inpatient psych admission; 15-minute checks; daily contact with patient to assess and evaluate symptoms and progress in treatment; psychoeducation.    , MD 12/31/2019, 10:31 AM

## 2019-12-31 NOTE — Tx Team (Signed)
Interdisciplinary Treatment and Diagnostic Plan Update  12/31/2019 Time of Session: 11:00AM Joshua Shaffer. MRN: [id number]  Principal Diagnosis: Acute psychosis (HCC)  Secondary Diagnoses: Principal Problem:   Acute psychosis (HCC) Active Problems:   Panic attacks   Current Medications:  Current Facility-Administered Medications  Medication Dose Route Frequency Provider Last Rate Last Admin  . acetaminophen (TYLENOL) tablet 650 mg  650 mg Oral Q6H PRN Clapacs, Jackquline Denmark, MD   650 mg at 12/30/19 2303  . alum & mag hydroxide-simeth (MAALOX/MYLANTA) 200-200-20 MG/5ML suspension 30 mL  30 mL Oral Q4H PRN Clapacs, John T, MD      . docusate sodium (COLACE) capsule 100 mg  100 mg Oral BID Clapacs, Jackquline Denmark, MD   100 mg at 12/31/19 0806  . magnesium hydroxide (MILK OF MAGNESIA) suspension 30 mL  30 mL Oral Daily PRN Clapacs, John T, MD      . ondansetron (ZOFRAN) tablet 4 mg  4 mg Oral Q8H PRN Clapacs, John T, MD      . PARoxetine (PAXIL) tablet 20 mg  20 mg Oral Daily Clapacs, Jackquline Denmark, MD   20 mg at 12/31/19 0806  . QUEtiapine (SEROQUEL) tablet 100 mg  100 mg Oral Daily Clapacs, Jackquline Denmark, MD   100 mg at 12/31/19 0806  . QUEtiapine (SEROQUEL) tablet 200 mg  200 mg Oral QHS Clapacs, John T, MD   200 mg at 12/30/19 2113   PTA Medications: Medications Prior to Admission  Medication Sig Dispense Refill Last Dose  . ondansetron (ZOFRAN) 4 MG tablet Take 1 tablet (4 mg total) by mouth every 8 (eight) hours as needed. 20 tablet 0   . PARoxetine (PAXIL) 20 MG tablet Take 1 tablet (20 mg total) by mouth daily. 30 tablet 1   . QUEtiapine (SEROQUEL) 300 MG tablet Take 1 tablet (300 mg total) by mouth at bedtime. 30 tablet 1     Patient Stressors: Financial difficulties Medication change or noncompliance  Patient Strengths: Average or above average intelligence Communication skills Physical Health Supportive family/friends  Treatment Modalities: Medication Management, Group therapy, Case  management,  1 to 1 session with clinician, Psychoeducation, Recreational therapy.   Physician Treatment Plan for Primary Diagnosis: Acute psychosis (HCC) Long Term Goal(s): Improvement in symptoms so as ready for discharge Improvement in symptoms so as ready for discharge   Short Term Goals: Ability to verbalize feelings will improve Ability to disclose and discuss suicidal ideas Ability to demonstrate self-control will improve Ability to maintain clinical measurements within normal limits will improve Compliance with prescribed medications will improve Ability to maintain clinical measurements within normal limits will improve  Medication Management: Evaluate patient's response, side effects, and tolerance of medication regimen.  Therapeutic Interventions: 1 to 1 sessions, Unit Group sessions and Medication administration.  Evaluation of Outcomes: Progressing  Physician Treatment Plan for Secondary Diagnosis: Principal Problem:   Acute psychosis (HCC) Active Problems:   Panic attacks  Long Term Goal(s): Improvement in symptoms so as ready for discharge Improvement in symptoms so as ready for discharge   Short Term Goals: Ability to verbalize feelings will improve Ability to disclose and discuss suicidal ideas Ability to demonstrate self-control will improve Ability to maintain clinical measurements within normal limits will improve Compliance with prescribed medications will improve Ability to maintain clinical measurements within normal limits will improve     Medication Management: Evaluate patient's response, side effects, and tolerance of medication regimen.  Therapeutic Interventions: 1 to 1 sessions, Unit Group sessions and  Medication administration.  Evaluation of Outcomes: Progressing   RN Treatment Plan for Primary Diagnosis: Acute psychosis ([place]) Long Term Goal(s): Knowledge of disease and therapeutic regimen to maintain health will improve  Short Term Goals:  Ability to remain free from injury will improve, Ability to participate in decision making will improve, Ability to verbalize feelings will improve, Ability to identify and develop effective coping behaviors will improve and Compliance with prescribed medications will improve  Medication Management: RN will administer medications as ordered by provider, will assess and evaluate patient's response and provide education to patient for prescribed medication. RN will report any adverse and/or side effects to prescribing provider.  Therapeutic Interventions: 1 on 1 counseling sessions, Psychoeducation, Medication administration, Evaluate responses to treatment, Monitor vital signs and CBGs as ordered, Perform/monitor CIWA, COWS, AIMS and Fall Risk screenings as ordered, Perform wound care treatments as ordered.  Evaluation of Outcomes: Progressing   LCSW Treatment Plan for Primary Diagnosis: Acute psychosis ([place]) Long Term Goal(s): Safe transition to appropriate next level of care at discharge, Engage patient in therapeutic group addressing interpersonal concerns.  Short Term Goals: Engage patient in aftercare planning with referrals and resources, Increase social support, Increase ability to appropriately verbalize feelings, Increase emotional regulation and Increase skills for wellness and recovery  Therapeutic Interventions: Assess for all discharge needs, 1 to 1 time with Social worker, Explore available resources and support systems, Assess for adequacy in community support network, Educate family and significant other(s) on suicide prevention, Complete Psychosocial Assessment, Interpersonal group therapy.  Evaluation of Outcomes: Progressing   Progress in Treatment: Attending groups: No. Participating in groups: No. Taking medication as prescribed: Yes. Toleration medication: Yes. Family/Significant other contact made: No, will contact:  Pt declined Patient understands diagnosis:  Yes. Discussing patient identified problems/goals with staff: Yes. Medical problems stabilized or resolved: Yes. Denies suicidal/homicidal ideation: Yes. Issues/concerns per patient self-inventory: No. Other: None  New problem(s) identified: No, Describe:  none  New Short Term/Long Term Goal(s):  Patient Goals:  "I want to manage my medications"  Discharge Plan or Barriers: "Pt will be discharged back to his home with his family. The pt will be connected to follow up care. Barriers include financially affording medication due to unemployment.   Reason for Continuation of Hospitalization: Medication stabilization Other; describe panic attacks and acute psychosis  Estimated Length of Stay: 3-5 days  Attendees: Patient: Joshua Shaffer 12/31/2019   Physician: Dr. Larita Fife, MD 12/31/2019   Nursing: Lyda Kalata, RN 12/31/2019   RN Care Manager: 12/31/2019   Social Worker: [place], [place] 12/31/2019   Recreational Therapist:  12/31/2019   Other:  12/31/2019  Other:  12/31/2019   Other: 12/31/2019    Scribe for Treatment Team: Charlott Rakes, Latanya Presser 12/31/2019 3:17 PM

## 2019-12-31 NOTE — Progress Notes (Signed)
F - Decrease delusions and lower anxiety levels.  D - Lenon joined others for breakfast, napped, and socialized in the milieu.  He endorsed heightened levels of anxiety and a feeling that his "heart was being pulled out of his chest."  He stated, "My soul is being taken out of my body when I'm in bed."  The patient denied thoughts of harming himself and others.  Medications were accepted as ordered.  Overall functioning and control of psychiatric issues mildly improved, but Deago continues to struggle with delusions and feelings of depersonalization.        A - The patient was provided support in attempting to manage his anxiety.  Ortho vital signs were taken with no significant results.  Mood was anxious and Tahj verbalized that he has been unable to slow down his thoughts.   No unsafe behaviors observed and interpersonal interactions were appropriate.         R - Continue with care and monitor anxiety levels.

## 2020-01-01 MED ORDER — QUETIAPINE FUMARATE 200 MG PO TABS: 200.0000 mg | ORAL_TABLET | Freq: Every day | ORAL | 0 refills | Status: DC

## 2020-01-01 MED ORDER — QUETIAPINE FUMARATE 200 MG PO TABS: 200.0000 mg | ORAL_TABLET | Freq: Every day | ORAL | 1 refills | Status: DC

## 2020-01-01 MED ORDER — PAROXETINE HCL 20 MG PO TABS: 20.0000 mg | ORAL_TABLET | Freq: Every day | ORAL | 0 refills | Status: DC

## 2020-01-01 MED ORDER — DOCUSATE SODIUM 100 MG PO CAPS: 100.0000 mg | ORAL_CAPSULE | Freq: Two times a day (BID) | ORAL | 1 refills | Status: DC

## 2020-01-01 MED ORDER — PAROXETINE HCL 20 MG PO TABS: 20.0000 mg | ORAL_TABLET | Freq: Every day | ORAL | 1 refills | Status: DC

## 2020-01-01 MED ORDER — QUETIAPINE FUMARATE 100 MG PO TABS: 100.0000 mg | ORAL_TABLET | Freq: Every day | ORAL | 0 refills | Status: DC

## 2020-01-01 MED ORDER — DOCUSATE SODIUM 100 MG PO CAPS: 100.0000 mg | ORAL_CAPSULE | Freq: Two times a day (BID) | ORAL | 0 refills | Status: DC

## 2020-01-01 MED ORDER — QUETIAPINE FUMARATE 100 MG PO TABS: 100.0000 mg | ORAL_TABLET | Freq: Every day | ORAL | 1 refills | Status: DC

## 2020-01-01 NOTE — BHH Group Notes (Signed)
LCSW Group Therapy Note   01/01/2020 1:00 PM  Type of Therapy and Topic:  Group Therapy:  Overcoming Obstacles   Participation Level:  Did Not Attend   Description of Group:    In this group patients will be encouraged to explore what they see as obstacles to their own wellness and recovery. They will be guided to discuss their thoughts, feelings, and behaviors related to these obstacles. The group will process together ways to cope with barriers, with attention given to specific choices patients can make. Each patient will be challenged to identify changes they are motivated to make in order to overcome their obstacles. This group will be process-oriented, with patients participating in exploration of their own experiences as well as giving and receiving support and challenge from other group members.   Therapeutic Goals: 1. Patient will identify personal and current obstacles as they relate to admission. 2. Patient will identify barriers that currently interfere with their wellness or overcoming obstacles.  3. Patient will identify feelings, thought process and behaviors related to these barriers. 4. Patient will identify two changes they are willing to make to overcome these obstacles:      Summary of Patient Progress X   Therapeutic Modalities:   Cognitive Behavioral Therapy Solution Focused Therapy Motivational Interviewing Relapse Prevention Therapy  Penni Homans, MSW, LCSW 01/01/2020 12:21 PM

## 2020-01-01 NOTE — Progress Notes (Signed)
Patient has been up during the night with multiple somatic complaints. Saying he feels his soul is being pulled out of his body. Asking for medication because he feels like his heart is racing. Vital signs taken and heart rate is 94. Medicated for nausea per prn order. Denies SI, HI and AVH

## 2020-01-01 NOTE — Plan of Care (Signed)
  Problem: Education: Goal: Knowledge of [facility] Education information/materials will improve Outcome: Progressing Goal: Emotional status will improve Outcome: Progressing Goal: Mental status will improve Outcome: Progressing Goal: Verbalization of understanding the information provided will improve Outcome: Progressing   

## 2020-01-01 NOTE — BHH Suicide Risk Assessment (Signed)
[facility] Discharge Suicide Risk Assessment   Principal Problem: Acute psychosis (HCC) Discharge Diagnoses: Principal Problem:   Acute psychosis (HCC) Active Problems:   Panic attacks   Total Time spent with patient: 30 minutes  Musculoskeletal: Strength & Muscle Tone: within normal limits Gait & Station: normal Patient leans: N/A  Psychiatric Specialty Exam: Review of Systems  Constitutional: Negative.   HENT: Negative.   Eyes: Negative.   Respiratory: Negative.   Cardiovascular: Negative.   Gastrointestinal: Negative.   Musculoskeletal: Negative.   Skin: Negative.   Neurological: Negative.   Psychiatric/Behavioral: Negative for dysphoric mood, hallucinations, self-injury and suicidal ideas. The patient is nervous/anxious.     Blood pressure (!) 125/93, pulse 73, temperature 98.5 F (36.9 C), temperature source Oral, resp. rate 18, height 5\' 11"  (1.803 m), weight 70.3 kg, SpO2 100 %.Body mass index is 21.62 kg/m.  General Appearance: Casual  Eye Contact::  Fair  Speech:  Clear and Coherent409  Volume:  Normal  Mood:  Euthymic  Affect:  Constricted  Thought Process:  Goal Directed  Orientation:  Full (Time, Place, and Person)  Thought Content:  Logical  Suicidal Thoughts:  No  Homicidal Thoughts:  No  Memory:  Immediate;   Fair Recent;   Fair Remote;   Fair  Judgement:  Fair  Insight:  Fair  Psychomotor Activity:  Decreased  Concentration:  Fair  Recall:  [phone] of Knowledge:Fair  Language: Fair  Akathisia:  No  Handed:  Right  AIMS (if indicated):     Assets:  Desire for Improvement  Sleep:  Number of Hours: 5  Cognition: WNL  ADL's:  Intact   Mental Status Per Nursing Assessment::   On Admission:  NA  Demographic Factors:  Male  Loss Factors: Financial problems/change in socioeconomic status  Historical Factors: NA  Risk Reduction Factors:   Responsible for children under 6 years of age, Sense of responsibility to family, Religious beliefs  about death, Living with another person, especially a relative, Positive social support and Positive therapeutic relationship  Continued Clinical Symptoms:  Bipolar Disorder:   Mixed State  Cognitive Features That Contribute To Risk:  None    Suicide Risk:  Minimal: No identifiable suicidal ideation.  Patients presenting with no risk factors but with morbid ruminations; may be classified as minimal risk based on the severity of the depressive symptoms  Follow-up Information    [facility] Follow up.   Why: You are scheduled to meet with 15, peer support specialist via zoom on Monday, May 3rd at 7am. Thank you. Contact information: [address] [place] [phone]           Plan Of Care/Follow-up recommendations:  Activity:  Activity as tolerated Diet:  Regular diet Other:  Follow-up with [facility]  [phone], MD 01/01/2020, 10:46 AM

## 2020-01-01 NOTE — Progress Notes (Signed)
  [facility] Adult Case Management Discharge Plan :  Will you be returning to the same living situation after discharge:  Yes,  pt lives with significant other At discharge, do you have transportation home?: Yes,  family member picked up Do you have the ability to pay for your medications: No.  Release of information consent forms completed and in the chart;  Patient's signature needed at discharge.  Patient to Follow up at: Follow-up Information    [facility] Follow up.   Why: You are scheduled to meet with Unk Pinto, peer support specialist via zoom on Monday, May 3rd at 7am. Thank you. Contact information: [address] [place] [address] [phone]           Next level of care provider has access to [facility] Link:no  Safety Planning and Suicide Prevention discussed: Yes,  with pt; declined family contact  Have you used any form of tobacco in the last 30 days? (Cigarettes, Smokeless Tobacco, Cigars, and/or Pipes): Yes  Has patient been referred to the Quitline?: Patient refused referral  Patient has been referred for addiction treatment: N/A  Suzan Slick, LCSW 01/01/2020, 1:32 PM

## 2020-01-01 NOTE — BHH Group Notes (Signed)
[facility] Group Notes:  (Nursing/MHT/Case Management/Adjunct)  Date:  01/01/2020  Time:  9:43 AM  Type of Therapy:  [facility] .   Participation Level:  Did Not Attend   Hulda Marin 01/01/2020, 9:43 AM

## 2020-01-01 NOTE — Progress Notes (Signed)
Patient denies SI/HI, denies A/V hallucinations. Patient verbalizes understanding of discharge instructions, follow up care and prescriptions.7 days medicines given to patient. Patient given all belongings from [facility] locker. Patient escorted out by staff, transported by family. 

## 2020-01-04 NOTE — Discharge Summary (Signed)
Physician Discharge Summary Note  Patient:  Joshua Shaffer. is an 34 y.o., male MRN:  [id number] DOB:  09-Mar-1986 Patient phone:  [phone] (home)  Patient address:   [address] [place] [address],  Total Time spent with patient: 30 minutes  Date of Admission:  12/29/2019 Date of Discharge: 12/2619  Reason for Admission: Admitted because of psychotic symptoms  Principal Problem: Acute psychosis [facility]) Discharge Diagnoses: Principal Problem:   Acute psychosis ([place]) Active Problems:   Panic attacks   Past Psychiatric History: Past history of psychosis and anxiety  Past Medical History:  Past Medical History:  Diagnosis Date  . Dizziness     Past Surgical History:  Procedure Laterality Date  . TONSILLECTOMY     Family History:  Family History  Problem Relation Age of Onset  . Lung cancer Maternal Grandfather    Family Psychiatric  History: Positive for psychotic disorder Social History:  Social History   Substance and Sexual Activity  Alcohol Use No   Comment: rarely     Social History   Substance and Sexual Activity  Drug Use Yes  . Types: Marijuana    Social History   Socioeconomic History  . Marital status: Married    Spouse name: Not on file  . Number of children: Not on file  . Years of education: Not on file  . Highest education level: Not on file  Occupational History  . Not on file  Tobacco Use  . Smoking status: Current Every Day Smoker    Packs/day: 0.50    Years: 14.00    Pack years: 7.00    Types: Cigarettes  . Smokeless tobacco: Never Used  Substance and Sexual Activity  . Alcohol use: No    Comment: rarely  . Drug use: Yes    Types: Marijuana  . Sexual activity: Not on file  Other Topics Concern  . Not on file  Social History Narrative  . Not on file   Social Determinants of Health   Financial Resource Strain:   . Difficulty of Paying Living Expenses:   Food Insecurity:   . Worried About Sales executive in the Last Year:   . Arboriculturist in the Last Year:   Transportation Needs:   . Film/video editor (Medical):   Marland Kitchen Lack of Transportation (Non-Medical):   Physical Activity:   . Days of Exercise per Week:   . Minutes of Exercise per Session:   Stress:   . Feeling of Stress :   Social Connections:   . Frequency of Communication with Friends and Family:   . Frequency of Social Gatherings with Friends and Family:   . Attends Religious Services:   . Active Member of Clubs or Organizations:   . Attends Archivist Meetings:   Marland Kitchen Marital Status:     Hospital Course: Admitted to the hospital.  Started on antipsychotic medicine.  15-minute checks maintained.  Patient was included in individual and group therapy and assessment.  He showed no dangerous behavior during his time in the hospital.  Patient was cooperative with medication and treatment planning.  He met with representative from [place] and was agreeable to outpatient treatment.  Physical Findings: AIMS:  , ,  ,  ,    CIWA:    COWS:     Musculoskeletal: Strength & Muscle Tone: within normal limits Gait & Station: normal Patient leans: N/A  Psychiatric Specialty Exam: Physical Exam  Nursing note  and vitals reviewed. Constitutional: He appears well-developed and well-nourished.  HENT:  Head: Normocephalic and atraumatic.  Eyes: Pupils are equal, round, and reactive to light. Conjunctivae are normal.  Cardiovascular: Normal heart sounds.  Respiratory: Effort normal.  GI: Soft.  Musculoskeletal:        General: Normal range of motion.     Cervical back: Normal range of motion.  Neurological: He is alert.  Skin: Skin is warm and dry.  Psychiatric: He has a normal mood and affect. His behavior is normal. Judgment and thought content normal.    Review of Systems  Constitutional: Negative.   HENT: Negative.   Eyes: Negative.   Respiratory: Negative.   Cardiovascular: Negative.   Gastrointestinal:  Negative.   Musculoskeletal: Negative.   Skin: Negative.   Neurological: Negative.   Psychiatric/Behavioral: Negative.     Blood pressure (!) 125/93, pulse 73, temperature 98.5 F (36.9 C), temperature source Oral, resp. rate 18, height _0  (1.803 m), weight 70.3 kg, SpO2 100 %.Body mass index is 21.62 kg/m.  General Appearance: Casual  Eye Contact:  Good  Speech:  Clear and Coherent  Volume:  Normal  Mood:  Euthymic  Affect:  Congruent  Thought Process:  Goal Directed  Orientation:  Full (Time, Place, and Person)  Thought Content:  Logical  Suicidal Thoughts:  No  Homicidal Thoughts:  No  Memory:  Immediate;   Fair Recent;   Fair Remote;   Fair  Judgement:  Fair  Insight:  Fair  Psychomotor Activity:  Normal  Concentration:  Concentration: Fair  Recall:  [place] of Knowledge:  Fair  Language:  Fair  Akathisia:  No  Handed:  Right  AIMS (if indicated):     Assets:  Desire for Improvement  ADL's:  Intact  Cognition:  WNL  Sleep:  Number of Hours: 5     Have you used any form of tobacco in the last 30 days? (Cigarettes, Smokeless Tobacco, Cigars, and/or Pipes): Yes  Has this patient used any form of tobacco in the last 30 days? (Cigarettes, Smokeless Tobacco, Cigars, and/or Pipes) Yes, No  Blood Alcohol level:  Lab Results  Component Value Date   ETH <10 76/73/4193    Metabolic Disorder Labs:  Lab Results  Component Value Date   HGBA1C 5.5 12/14/2018   MPG 111.15 12/14/2018   No results found for: PROLACTIN Lab Results  Component Value Date   CHOL 91 12/14/2018   TRIG 92 12/14/2018   HDL 49 12/14/2018   CHOLHDL 1.9 12/14/2018   VLDL 18 12/14/2018   LDLCALC 24 12/14/2018    See Psychiatric Specialty Exam and Suicide Risk Assessment completed by Attending Physician prior to discharge.  Discharge destination:  Home  Is patient on multiple antipsychotic therapies at discharge:  No   Has Patient had three or more failed trials of antipsychotic  monotherapy by history:  No  Recommended Plan for Multiple Antipsychotic Therapies: NA  Discharge Instructions    Diet - low sodium heart healthy   Complete by: As directed    Increase activity slowly   Complete by: As directed      Allergies as of 01/01/2020   No Known Allergies     Medication List    STOP taking these medications   ondansetron 4 MG tablet Commonly known as: Zofran     TAKE these medications     Indication  docusate sodium 100 MG capsule Commonly known as: COLACE Take 1 capsule (100 mg total) by  mouth 2 (two) times daily.  Indication: Constipation   PARoxetine 20 MG tablet Commonly known as: PAXIL Take 1 tablet (20 mg total) by mouth daily.  Indication: Panic Disorder   QUEtiapine 200 MG tablet Commonly known as: SEROQUEL Take 1 tablet (200 mg total) by mouth at bedtime. What changed:   medication strength  how much to take  Indication: Manic Phase of Manic-Depression   QUEtiapine 100 MG tablet Commonly known as: SEROQUEL Take 1 tablet (100 mg total) by mouth daily. What changed: You were already taking a medication with the same name, and this prescription was added. Make sure you understand how and when to take each.  Indication: Manic Phase of Manic-Depression      Follow-up Information    [facility] Follow up.   Why: You are scheduled to meet with Sherrian Divers, peer support specialist via zoom on Monday, May 3rd at [place]. Thank you. Contact information: [place] [address] [phone]           Follow-up recommendations:  Activity:  Activity as tolerated Diet:  Regular diet Other:  Follow-up [facility] continue current medicine  Comments: Prescriptions given at discharge  Signed: Alethia Berthold, MD 01/04/2020, 4:45 PM

## 2020-02-22 ENCOUNTER — Encounter: Payer: Self-pay | Admitting: Family Medicine

## 2020-02-22 ENCOUNTER — Other Ambulatory Visit: Payer: Self-pay

## 2020-02-22 ENCOUNTER — Ambulatory Visit (INDEPENDENT_AMBULATORY_CARE_PROVIDER_SITE_OTHER): Payer: Self-pay | Admitting: Family Medicine

## 2020-02-22 VITALS — BP 125/81 | HR 84 | Temp 97.5°F | Ht 71.0 in | Wt 168.6 lb

## 2020-02-22 DIAGNOSIS — H6123 Impacted cerumen, bilateral: Secondary | ICD-10-CM | POA: Insufficient documentation

## 2020-02-22 MED ORDER — DEBROX 6.5 % OT SOLN: 5.0000 [drp] | Freq: Two times a day (BID) | OTIC | 1 refills | Status: DC

## 2020-02-22 NOTE — Patient Instructions (Signed)
We have cleaned both of your ears from excess ear wax today.  I have sent in a prescription for Debrox to use in both of your ears when you begin to have increased ear wax.  This will help loosen the ear wax so it will remove easily in the shower.  We will plan to see you back in 4 weeks for your physical  You will receive a survey after today's visit either digitally by e-mail or paper by USPS mail. Your experiences and feedback matter to [place].  Please respond so we know how we are doing as we provide care for you.  Call us with any questions/concerns/needs.  It is my goal to be available to you for your health concerns.  Thanks for choosing me to be a partner in your healthcare needs!  Charlaine Dalton, FNP-C Family Nurse Practitioner [facility] Phone: [phone]

## 2020-02-22 NOTE — Progress Notes (Signed)
Subjective:    Patient ID: Joshua Shaffer., male    DOB: May 07, 1986, 34 y.o.   MRN: [id number]  Joshua Shaffer. is a 34 y.o. male presenting on 02/22/2020 for Tinnitus (bilateral tinnutis x 2 weeks.pt state it sounds more like a static sound. He denies itching, pain or drainage from the ears.)   HPI  [name] presents to clinic for concerns of decreased hearing x 2 weeks with hearing "static".  Reports history of increased ear wax when he was a child, that needed removal at a doctor's office.  Denies any fevers, dizziness, vertigo, drainage from ears, ear pain with palpation.  Has not tried anything for his symptoms.  Depression screen [facility] 2/9 12/12/2018 05/17/2017 03/25/2017  Decreased Interest 3 0 1  Down, Depressed, Hopeless 1 0 1  PHQ - 2 Score 4 0 2  Altered sleeping 3 3 3   Tired, decreased energy 3 0 3  Change in appetite 2 0 3  Feeling bad or failure about yourself  1 0 0  Trouble concentrating 3 0 1  Moving slowly or fidgety/restless 3 0 1  Suicidal thoughts 0 0 0  PHQ-9 Score 19 3 13   Difficult doing work/chores Somewhat difficult Somewhat difficult Somewhat difficult    Social History   Tobacco Use  . Smoking status: Current Every Day Smoker    Packs/day: 0.50    Years: 14.00    Pack years: 7.00    Types: Cigarettes  . Smokeless tobacco: Never Used  Vaping Use  . Vaping Use: Never used  Substance Use Topics  . Alcohol use: No    Comment: rarely  . Drug use: Not Currently    Types: Marijuana    Review of Systems  Constitutional: Negative.   HENT: Positive for hearing loss. Negative for congestion, dental problem, drooling, ear discharge, ear pain, facial swelling, mouth sores, nosebleeds, postnasal drip, rhinorrhea, sinus pressure, sinus pain, sneezing, sore throat, tinnitus, trouble swallowing and voice change.   Eyes: Negative.   Respiratory: Negative.   Cardiovascular: Negative.   Gastrointestinal: Negative.   Endocrine: Negative.   Genitourinary:  Negative.   Musculoskeletal: Negative.   Skin: Negative.   Allergic/Immunologic: Negative.   Neurological: Negative.   Hematological: Negative.   Psychiatric/Behavioral: Negative.    Per HPI unless specifically indicated above     Objective:    BP 125/81 (BP Location: Left Arm, Patient Position: Sitting, Cuff Size: Normal)   Pulse 84   Temp (!) 97.5 F (36.4 C) (Temporal)   Ht 5\' 11"  (1.803 m)   Wt 168 lb 9.6 oz (76.5 kg)   BMI 23.51 kg/m   Wt Readings from Last 3 Encounters:  02/22/20 168 lb 9.6 oz (76.5 kg)  12/27/19 150 lb (68 kg)  12/27/19 150 lb (68 kg)    Physical Exam Vitals reviewed.  Constitutional:      General: He is not in acute distress.    Appearance: Normal appearance. He is well-developed, well-groomed and normal weight. He is not ill-appearing or toxic-appearing.  HENT:     Head: Normocephalic and atraumatic.     Right Ear: There is impacted cerumen.     Left Ear: There is impacted cerumen.     Nose:     Comments: Joshua Shaffer is in place, covering mouth and nose  Eyes:     General:        Right eye: No discharge.        Left eye: No discharge.  Extraocular Movements: Extraocular movements intact.     Conjunctiva/sclera: Conjunctivae normal.     Pupils: Pupils are equal, round, and reactive to light.  Cardiovascular:     Pulses: Normal pulses.  Pulmonary:     Effort: Pulmonary effort is normal. No respiratory distress.  Musculoskeletal:     Right lower leg: No edema.     Left lower leg: No edema.  Skin:    General: Skin is warm and dry.     Capillary Refill: Capillary refill takes less than 2 seconds.  Neurological:     General: No focal deficit present.     Mental Status: He is alert and oriented to person, place, and time.     Cranial Nerves: No cranial nerve deficit.  Psychiatric:        Mood and Affect: Mood normal.        Behavior: Behavior normal.    PROCEDURE NOTE Date: 02/22/2020 Bilateral Ear Lavage / Cerumen  Removal Discussed benefits and risks (including pain / discomforts, dizziness, minor abrasion of ear canal). Verbal consent given by patient. Medication:  carbamide peroxide ear drops, Ear Lavage Solution (warm water + hydrogen peroxide) Performed by CMA Several drops of carbamide peroxide placed in each ear, allowed to sit for few minutes. Ear lavage solution flushed into one ear at a time in attempt to dislodge and remove ear wax. Good results  Repeat Ear Exam: - Completely removed cerumen now, with clear ear canals and visible TMs clear and normal appearance.   Results for orders placed or performed during the hospital encounter of 12/27/19  Respiratory Panel by RT PCR (Flu A&B, Covid) - Nasopharyngeal Swab   Specimen: Nasopharyngeal Swab  Result Value Ref Range   SARS Coronavirus 2 by RT PCR NEGATIVE NEGATIVE   Influenza A by PCR NEGATIVE NEGATIVE   Influenza B by PCR NEGATIVE NEGATIVE  Urine Drug Screen, Qualitative ([facility] only)  Result Value Ref Range   Tricyclic, Ur Screen NONE DETECTED NONE DETECTED   Amphetamines, Ur Screen NONE DETECTED NONE DETECTED   MDMA (Ecstasy)Ur Screen NONE DETECTED NONE DETECTED   Cocaine Metabolite,Ur [place] NONE DETECTED NONE DETECTED   Opiate, Ur Screen NONE DETECTED NONE DETECTED   Phencyclidine (PCP) Ur S NONE DETECTED NONE DETECTED   Cannabinoid 50 Ng, Ur [place] POSITIVE (A) NONE DETECTED   Barbiturates, Ur Screen NONE DETECTED NONE DETECTED   Benzodiazepine, Ur Scrn NONE DETECTED NONE DETECTED   Methadone Scn, Ur NONE DETECTED NONE DETECTED      Assessment & Plan:   Problem List Items Addressed This Visit      Nervous and Auditory   Impacted cerumen of both ears    Bilateral cerumen impaction.  Lavaged with complete removal of impacted cerumen.  Reports immediate symptom resolution of static sound.  No dizziness or vertigo after cerumen impaction.    Plan: 1. Debrox ear drops sent to pharmacy on file.  To use at onset of symptoms. 2. Return  to clinic as needed for this 3. Schedule your physical for 4 weeks from now       Other Visit Diagnoses    Bilateral impacted cerumen    -  Primary   Relevant Medications   carbamide peroxide (DEBROX) 6.5 % OTIC solution      Meds ordered this encounter  Medications  . DISCONTD: carbamide peroxide (DEBROX) 6.5 % OTIC solution    Sig: Place 5 drops into both ears 2 (two) times daily.    Dispense:  15 mL  Refill:  1  . carbamide peroxide (DEBROX) 6.5 % OTIC solution    Sig: Place 5 drops into both ears 2 (two) times daily.    Dispense:  15 mL    Refill:  1      Follow up plan: Return in about 4 weeks (around 03/21/2020) for CPE.   Charlaine Dalton, FNP Family Nurse Practitioner [facility] 02/22/2020, 3:20 PM

## 2020-02-22 NOTE — Assessment & Plan Note (Signed)
Bilateral cerumen impaction.  Lavaged with complete removal of impacted cerumen.  Reports immediate symptom resolution of static sound.  No dizziness or vertigo after cerumen impaction.    Plan: 1. Debrox ear drops sent to pharmacy on file.  To use at onset of symptoms. 2. Return to clinic as needed for this 3. Schedule your physical for 4 weeks from now

## 2020-03-07 ENCOUNTER — Telehealth: Payer: Self-pay | Admitting: Pharmacy Technician

## 2020-03-07 NOTE — Telephone Encounter (Signed)
Patient failed to provide 2020 Federal Tax Return and the last 30 days of checking account statement.  No additional medication assistance will be provided until requested financial information is given to [facility].  Sherilyn Dacosta Care Manager [facility]

## 2020-03-22 ENCOUNTER — Telehealth: Payer: Self-pay | Admitting: Pharmacy Technician

## 2020-03-22 NOTE — Telephone Encounter (Signed)
Spouse of patient, Emin Foree called requesting a refill of medications for patient.  Informed patient that we still need 2020 Federal Tax Return and most recent bank statement.  Ms. Consuegra stated that she had already e-mailed tax return.  Explained that [facility] has not received the tax return.  Ms. Foerster unsure about when she e-mailed tax return.  Also, Ms. Alcorn stated that she does not have a checking account.  Informed Ms. Vore that she could write a letter stating that she no longer has a checking account, only has a debit card.  Informed Ms.Mahan that when [facility] receives the requested financial information, medication assistance will resume for the patient.  Ms. Cassedy verbally acknowledged that she understood.  Sherilyn Dacosta Care Manager [facility]

## 2020-03-22 NOTE — Telephone Encounter (Signed)
Spouse provided requested poi.  Patient eligible to receive medication assistance at [facility] until time for re-certification in 8413, and as long as eligibility requirements continue to be met.  [facility]

## 2020-11-26 ENCOUNTER — Telehealth: Payer: Self-pay | Admitting: Pharmacy Technician

## 2020-11-26 NOTE — Telephone Encounter (Signed)
Patient failed to provide 2022 proof of income.  No additional medication assistance will be provided by [employer] without the required proof of income documentation.  Patient notified by letter.  Sherilyn Dacosta Care Manager [facility]   [address] [place], [place]  [address]    November 25, 2020    Gerlene Fee, Montez Hageman [address] [place], [place]  [address]  Dear Onalee Hua:  This is to inform you that you are no longer eligible to receive medication assistance at [facility].  The reason(s) are:    _____Your total gross monthly household income exceeds 250% of the Federal Poverty Level.   _____Tangible assets (savings, checking, stocks/bonds, pension, retirement, etc.) exceeds our limit  _____You are eligible to receive benefits from [facility], [facility] or HIV Medication              Assistance Program _____You are eligible to receive benefits from a Medicare Part "D" plan _____You have prescription insurance  _____You are not an [facility] resident __X__Failure to provide all requested proof of income information for 2022.    Medication assistance will resume once all requested financial information has been returned to our clinic.  If you have questions, please contact our clinic at [phone].    Thank you,  [facility]

## 2021-01-24 ENCOUNTER — Other Ambulatory Visit: Payer: Self-pay

## 2021-03-14 ENCOUNTER — Ambulatory Visit (INDEPENDENT_AMBULATORY_CARE_PROVIDER_SITE_OTHER): Payer: Self-pay | Admitting: Internal Medicine

## 2021-03-14 ENCOUNTER — Encounter: Payer: Self-pay | Admitting: Internal Medicine

## 2021-03-14 ENCOUNTER — Other Ambulatory Visit: Payer: Self-pay

## 2021-03-14 VITALS — BP 114/77 | HR 81 | Temp 97.8°F | Resp 17 | Ht 71.0 in | Wt 166.8 lb

## 2021-03-14 DIAGNOSIS — F200 Paranoid schizophrenia: Secondary | ICD-10-CM

## 2021-03-14 DIAGNOSIS — R002 Palpitations: Secondary | ICD-10-CM

## 2021-03-14 DIAGNOSIS — F418 Other specified anxiety disorders: Secondary | ICD-10-CM

## 2021-03-14 DIAGNOSIS — G8929 Other chronic pain: Secondary | ICD-10-CM

## 2021-03-14 DIAGNOSIS — F410 Panic disorder [episodic paroxysmal anxiety] without agoraphobia: Secondary | ICD-10-CM

## 2021-03-14 DIAGNOSIS — F5104 Psychophysiologic insomnia: Secondary | ICD-10-CM

## 2021-03-14 DIAGNOSIS — R42 Dizziness and giddiness: Secondary | ICD-10-CM

## 2021-03-14 DIAGNOSIS — M545 Low back pain, unspecified: Secondary | ICD-10-CM
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
## 2021-03-14 NOTE — Patient Instructions (Signed)
Back Exercises The following exercises strengthen the muscles that help to support the trunk and back. They also help to keep the lower back flexible. Doing these exercises can help to prevent back pain or lessen existing pain. If you have back pain or discomfort, try doing these exercises 2-3 times each day or as told by your health care provider. As your pain improves, do them once each day, but increase the number of times that you repeat the steps for each exercise (do more repetitions). To prevent the recurrence of back pain, continue to do these exercises once each day or as told by your health care provider. Do exercises exactly as told by your health care provider and adjust them as directed. It is normal to feel mild stretching, pulling, tightness, or discomfort as you do these exercises, but you should stop right away if youfeel sudden pain or your pain gets worse. Exercises Single knee to chest Repeat these steps 3-5 times for each leg: Lie on your back on a firm bed or the floor with your legs extended. Bring one knee to your chest. Your other leg should stay extended and in contact with the floor. Hold your knee in place by grabbing your knee or thigh with both hands and hold. Pull on your knee until you feel a gentle stretch in your lower back or buttocks. Hold the stretch for 10-30 seconds. Slowly release and straighten your leg. Pelvic tilt Repeat these steps 5-10 times: Lie on your back on a firm bed or the floor with your legs extended. Bend your knees so they are pointing toward the ceiling and your feet are flat on the floor. Tighten your lower abdominal muscles to press your lower back against the floor. This motion will tilt your pelvis so your tailbone points up toward the ceiling instead of pointing to your feet or the floor. With gentle tension and even breathing, hold this position for 5-10 seconds. Cat-cow Repeat these steps until your lower back becomes more  flexible: Get into a hands-and-knees position on a firm surface. Keep your hands under your shoulders, and keep your knees under your hips. You may place padding under your knees for comfort. Let your head hang down toward your chest. Contract your abdominal muscles and point your tailbone toward the floor so your lower back becomes rounded like the back of a cat. Hold this position for 5 seconds. Slowly lift your head, let your abdominal muscles relax and point your tailbone up toward the ceiling so your back forms a sagging arch like the back of a cow. Hold this position for 5 seconds.  Press-ups Repeat these steps 5-10 times: Lie on your abdomen (face-down) on the floor. Place your palms near your head, about shoulder-width apart. Keeping your back as relaxed as possible and keeping your hips on the floor, slowly straighten your arms to raise the top half of your body and lift your shoulders. Do not use your back muscles to raise your upper torso. You may adjust the placement of your hands to make yourself more comfortable. Hold this position for 5 seconds while you keep your back relaxed. Slowly return to lying flat on the floor.  Bridges Repeat these steps 10 times: Lie on your back on a firm surface. Bend your knees so they are pointing toward the ceiling and your feet are flat on the floor. Your arms should be flat at your sides, next to your body. Tighten your buttocks muscles and lift your   buttocks off the floor until your waist is at almost the same height as your knees. You should feel the muscles working in your buttocks and the back of your thighs. If you do not feel these muscles, slide your feet 1-2 inches farther away from your buttocks. Hold this position for 3-5 seconds. Slowly lower your hips to the starting position, and allow your buttocks muscles to relax completely. If this exercise is too easy, try doing it with your arms crossed over yourchest. Abdominal  crunches Repeat these steps 5-10 times: Lie on your back on a firm bed or the floor with your legs extended. Bend your knees so they are pointing toward the ceiling and your feet are flat on the floor. Cross your arms over your chest. Tip your chin slightly toward your chest without bending your neck. Tighten your abdominal muscles and slowly raise your trunk (torso) high enough to lift your shoulder blades a tiny bit off the floor. Avoid raising your torso higher than that because it can put too much stress on your low back and does not help to strengthen your abdominal muscles. Slowly return to your starting position. Back lifts Repeat these steps 5-10 times: Lie on your abdomen (face-down) with your arms at your sides, and rest your forehead on the floor. Tighten the muscles in your legs and your buttocks. Slowly lift your chest off the floor while you keep your hips pressed to the floor. Keep the back of your head in line with the curve in your back. Your eyes should be looking at the floor. Hold this position for 3-5 seconds. Slowly return to your starting position. Contact a health care provider if: Your back pain or discomfort gets much worse when you do an exercise. Your worsening back pain or discomfort does not lessen within 2 hours after you exercise. If you have any of these problems, stop doing these exercises right away. Do not do them again unless your health care provider says that you can. Get help right away if: You develop sudden, severe back pain. If this happens, stop doing the exercises right away. Do not do them again unless your health care provider says that you can. This information is not intended to replace advice given to you by your health care provider. Make sure you discuss any questions you have with your healthcare provider. Document Revised: 12/29/2018 Document Reviewed: 05/26/2018 Elsevier Patient Education  2022 [employer]  

## 2021-03-15 ENCOUNTER — Encounter: Payer: Self-pay | Admitting: Internal Medicine

## 2021-03-15 DIAGNOSIS — G8929 Other chronic pain: Secondary | ICD-10-CM | POA: Insufficient documentation

## 2021-03-15 NOTE — Assessment & Plan Note (Signed)
Continue Paroxetine and Seroquel He will continue to see his therapist and psychiatrist Support offered 

## 2021-03-15 NOTE — Assessment & Plan Note (Signed)
Continue Paroxetine and Seroquel He will continue to see his therapist and psychiatrist Support offered

## 2021-03-15 NOTE — Assessment & Plan Note (Signed)
X-ray lumbar spine ordered Encouraged daily stretching

## 2021-07-17 ENCOUNTER — Other Ambulatory Visit: Payer: Self-pay

## 2021-07-25 ENCOUNTER — Other Ambulatory Visit: Payer: Self-pay

## 2021-07-25 ENCOUNTER — Encounter: Payer: Self-pay | Admitting: Emergency Medicine

## 2021-07-25 ENCOUNTER — Emergency Department: Payer: Self-pay

## 2021-07-25 ENCOUNTER — Emergency Department
Admission: EM | Admit: 2021-07-25 | Discharge: 2021-07-25 | Disposition: A | Discharge status: Home / Self Care | Payer: Self-pay | Attending: Emergency Medicine | Admitting: Emergency Medicine

## 2021-07-25 DIAGNOSIS — R519 Headache, unspecified: Secondary | ICD-10-CM | POA: Insufficient documentation

## 2021-07-25 DIAGNOSIS — N50811 Right testicular pain: Secondary | ICD-10-CM | POA: Insufficient documentation

## 2021-07-25 DIAGNOSIS — N50812 Left testicular pain: Secondary | ICD-10-CM | POA: Insufficient documentation

## 2021-07-25 DIAGNOSIS — R0602 Shortness of breath: Secondary | ICD-10-CM | POA: Insufficient documentation

## 2021-07-25 DIAGNOSIS — F17210 Nicotine dependence, cigarettes, uncomplicated: Secondary | ICD-10-CM | POA: Insufficient documentation

## 2021-07-25 LAB — URINALYSIS, COMPLETE (UACMP) WITH MICROSCOPIC
Bacteria, UA: NONE SEEN
Bilirubin Urine: NEGATIVE
Glucose, UA: NEGATIVE mg/dL
Hgb urine dipstick: NEGATIVE
Ketones, ur: NEGATIVE mg/dL
Leukocytes,Ua: NEGATIVE
Nitrite: NEGATIVE
Protein, ur: NEGATIVE mg/dL
Specific Gravity, Urine: 1.012 (ref 1.005–1.030)
Squamous Epithelial / HPF: NONE SEEN (ref 0–5)
pH: 6 (ref 5.0–8.0)

## 2021-07-25 LAB — CHLAMYDIA/NGC RT PCR (ARMC ONLY)
Chlamydia Tr: NOT DETECTED
N gonorrhoeae: NOT DETECTED

## 2021-07-25 MED ORDER — IBUPROFEN 800 MG PO TABS
800.0000 mg | ORAL_TABLET | Freq: Once | ORAL | Status: AC
  Administered 2021-07-25: 800 mg via ORAL
  Filled 2021-07-25: qty 1

## 2021-07-25 NOTE — Discharge Instructions (Addendum)
You may alternate Tylenol 1000 mg every 6 hours as needed for pain, fever and Ibuprofen 800 mg every 8 hours as needed for pain, fever.  Please take Ibuprofen with food.  Do not take more than 4000 mg of Tylenol (acetaminophen) in a 24 hour period.   Your chest x-ray showed no abnormality today.  Your EKG showed no new changes.  Your urine showed no sign of infection.  Ultrasound of your testicles was normal.  If you continue to have discomfort in your head or testicles or continue to have shortness of breath I recommend follow-up with your primary care provider.    If you develop worsening testicular pain or have redness or swelling of your testicles, have difficulty urinating, vomiting that does not stop, fever of 100.4 or higher, severe abdominal pain or back pain, chest pain, blue lips or blue fingertips, numbness tingling or weakness in your extremities or face, changes in your vision or speech, please return to the emergency department.

## 2021-07-25 NOTE — ED Triage Notes (Signed)
Pt in via [facility] from home, reports testicular "burning" since last night.  Also states, "My brain feels like its getting shocked, like it has a heartbeat in it."  Ambulatory to triage, vitals WDL, NAD noted at this time.

## 2021-07-25 NOTE — ED Notes (Signed)
During triage, patient goes on to state, "When I drink the sink water, it makes my head feel like that, so I think its the water."

## 2021-07-25 NOTE — ED Notes (Signed)
Discharged by previous RN.

## 2021-07-25 NOTE — ED Provider Notes (Signed)
[facility] Emergency Department Provider Note  ____________________________________________   Event Date/Time   First MD Initiated Contact with Patient 07/25/21 [phone]     (approximate)  I have reviewed the triage vital signs and the nursing notes.   HISTORY  Chief Complaint Testicle Pain    HPI Joshua Shaffer. is a 35 y.o. male with history of schizophrenia, anxiety, depression who presents to the emergency department with multiple complaints.  He states he is having a throbbing in his head that is radiating throughout his body.  He denies any head injury, neck pain or neck stiffness, fever, numbness, tingling or weakness.  He also complains of shortness of breath.  No chest pain or chest discomfort.  No fever or cough.  No lower extremity swelling or pain.  No history of PE, DVT, exogenous estrogen use, recent fractures, surgery, trauma, hospitalization, prolonged travel or other immobilization. No lower extremity swelling or pain. No calf tenderness.  No history of anemia.  No bloody stools or melena.   Also describes a tightening in his testicles.  No abdominal pain, flank pain, dysuria, hematuria, discharge.  No testicular masses or scrotal swelling.   States all of the symptoms have started today and are improving.  He did not take any medications prior to arrival.  Denies any aggravating or alleviating factors to any of his symptoms.       Past Medical History:  Diagnosis Date   Dizziness     Patient Active Problem List   Diagnosis Date Noted   Chronic midline low back pain without sciatica 03/15/2021   Schizophrenia (HCC) 12/29/2019   Panic attacks 12/15/2018   Insomnia 03/27/2017   Anxiety with depression 02/25/2017    Past Surgical History:  Procedure Laterality Date   TONSILLECTOMY      Prior to Admission medications   Medication Sig Start Date End Date Taking? Authorizing Provider  PARoxetine (PAXIL) 20 MG tablet Take 1  tablet (20 mg total) by mouth daily. 01/01/20   Clapacs, Jackquline Denmark, MD  QUEtiapine (SEROQUEL) 100 MG tablet Take 1 tablet (100 mg total) by mouth daily. Patient taking differently: Take 100 mg by mouth 2 (two) times daily. 01/02/20   Clapacs, Jackquline Denmark, MD    Allergies Patient has no known allergies.  Family History  Problem Relation Age of Onset   Lung cancer Maternal Grandfather     Social History Social History   Tobacco Use   Smoking status: Every Day    Packs/day: 0.50    Years: 14.00    Pack years: 7.00    Types: Cigarettes   Smokeless tobacco: Never  Vaping Use   Vaping Use: Never used  Substance Use Topics   Alcohol use: No   Drug use: Not Currently    Types: Marijuana    Review of Systems Constitutional: No fever. Eyes: No visual changes. ENT: No sore throat. Cardiovascular: Denies chest pain. Respiratory: + shortness of breath. Gastrointestinal: No nausea, vomiting, diarrhea. Genitourinary: Negative for dysuria. Musculoskeletal: Negative for back pain. Skin: Negative for rash. Neurological: Negative for focal weakness or numbness.  ____________________________________________   PHYSICAL EXAM:  VITAL SIGNS: ED Triage Vitals  Enc Vitals Group     BP 07/25/21 0430 (!) 118/96     Pulse Rate 07/25/21 0430 71     Resp 07/25/21 0430 15     Temp 07/25/21 0430 98 F (36.7 C)     Temp Source 07/25/21 0430 Oral     SpO2  07/25/21 0430 97 %     Weight 07/25/21 0431 188 lb 15 oz (85.7 kg)     Height 07/25/21 0431 5\' 10"  (1.778 m)     Head Circumference --      Peak Flow --      Pain Score 07/25/21 0430 0     Pain Loc --      Pain Edu? --      Excl. in [place]? --    CONSTITUTIONAL: Alert and oriented and responds appropriately to questions. Well-appearing; well-nourished HEAD: Normocephalic, atraumatic EYES: Conjunctivae clear, pupils appear equal, EOM appear intact ENT: normal nose; moist mucous membranes NECK: Supple, normal ROM, no midline spinal tenderness or  step-off or deformity, no meningismus CARD: RRR; S1 and S2 appreciated; no murmurs, no clicks, no rubs, no gallops RESP: Normal chest excursion without splinting or tachypnea; breath sounds clear and equal bilaterally; no wheezes, no rhonchi, no rales, no hypoxia or respiratory distress, speaking full sentences ABD/GI: Normal bowel sounds; non-distended; soft, non-tender, no rebound, no guarding, no peritoneal signs, no hepatosplenomegaly GU:  Normal external genitalia, circumcised male, normal penile shaft, no blood or discharge at the urethral meatus, no testicular masses or tenderness on exam, no scrotal masses or swelling, no hernias appreciated, 2+ femoral pulses bilaterally; no perineal erythema, warmth, subcutaneous air or crepitus; no high riding testicle, normal bilateral cremasteric reflex.  Chaperone present for exam. BACK: The back appears normal, no CVA tenderness, no midline spinal tenderness or step-off or deformity EXT: Normal ROM in all joints; no deformity noted, no edema; no cyanosis, no calf tenderness or calf swelling SKIN: Normal color for age and race; warm; no rash on exposed skin NEURO: Moves all extremities equally, normal sensation diffusely, cranial nerves II through XII intact, normal gait, normal speech PSYCH: The patient's mood and manner are appropriate.  ____________________________________________   LABS (all labs ordered are listed, but only abnormal results are displayed)  Labs Reviewed  URINALYSIS, COMPLETE (UACMP) WITH MICROSCOPIC - Abnormal; Notable for the following components:      Result Value   Color, Urine YELLOW (*)    APPearance CLEAR (*)    All other components within normal limits  CHLAMYDIA/NGC RT PCR ([facility] ONLY)             ____________________________________________  EKG   Date: 07/25/2021 6:32 AM  Rate: 59  Rhythm: normal sinus rhythm  QRS Axis: normal  Intervals: normal  ST/T Wave abnormalities: T wave inversions in inferior  lateral leads  Conduction Disutrbances: none  Narrative Interpretation: Repolarization abnormality in inferior lateral leads with no significant change compared to previous    ____________________________________________  RADIOLOGY I, Rosemae Mcquown, personally viewed and evaluated these images (plain radiographs) as part of my medical decision making, as well as reviewing the written report by the radiologist.  ED MD interpretation: Chest x-ray shows no acute abnormality.  Scrotal ultrasound shows normal blood flow to both testicles with no other acute abnormality.  Official radiology report(s): DG Chest 2 View  Result Date: 07/25/2021 CLINICAL DATA:  35 year old male with shortness of breath.  Smoker. EXAM: CHEST - 2 VIEW COMPARISON:  Chest radiographs 12/27/2019. FINDINGS: Mildly lower lung volumes. Normal cardiac size and mediastinal contours. Visualized tracheal air column is within normal limits. Bilateral lung markings are stable from last year, with some chronic increase in the interstitium which is likely smoking related. No pneumothorax, pulmonary edema, pleural effusion or confluent pulmonary opacity. No acute osseous abnormality identified. Negative visible bowel gas pattern. IMPRESSION:  No acute cardiopulmonary abnormality. Electronically Signed   By: Genevie Ann M.D.   On: 07/25/2021 06:42   US SCROTUM W/DOPPLER  Result Date: 07/25/2021 CLINICAL DATA:  35 year old male with testicular pain onset last night. EXAM: SCROTAL ULTRASOUND DOPPLER ULTRASOUND OF THE TESTICLES TECHNIQUE: Complete ultrasound examination of the testicles, epididymis, and other scrotal structures was performed. Color and spectral Doppler ultrasound were also utilized to evaluate blood flow to the testicles. COMPARISON:  None. FINDINGS: Right testicle Measurements: 4.7 x 2.2 x 2.7 cm. No mass or microlithiasis visualized. Left testicle Measurements: 4.8 x 2.1 x 3.0 cm. No mass or microlithiasis visualized. Right  epididymis:  Normal in size and appearance. Left epididymis:  Normal in size and appearance. Hydrocele:  Trace bilateral, significance doubtful. Varicocele:  None visualized. Pulsed Doppler interrogation of both testes demonstrates normal low resistance arterial and venous waveforms bilaterally. IMPRESSION: Normal scrotal ultrasound with Doppler. No evidence of testicular torsion. Electronically Signed   By: Genevie Ann M.D.   On: 07/25/2021 06:20    ____________________________________________   PROCEDURES  Procedure(s) performed (including Critical Care):  Procedures    ____________________________________________   INITIAL IMPRESSION / ASSESSMENT AND PLAN / ED COURSE  As part of my medical decision making, I reviewed the following data within the [facility] notes reviewed and incorporated, Labs reviewed , EKG interpreted , Old EKG reviewed, Old chart reviewed, Radiograph reviewed , and Notes from prior ED visits       Patient here with multiple different complaints.  Complaining of headache.  Has no focal neurologic deficits, fever, meningismus.  Doubt CVA, intracranial hemorrhage, CVT, meningitis.  Will give ibuprofen for his pain.  Also complaining of shortness of breath.  No hypoxia or increased work of breathing.  Able to ambulate here without any distress.  Will obtain chest x-ray and EKG.  No symptoms to suggest anemia causing his symptoms.  He is PERC negative.  No history of CAD.  Also complaining of bilateral testicular tightness that has improved.  GU exam is unremarkable.  Urine shows no sign of infection.  Scrotal ultrasound and gonorrhea and Chlamydia test pending.  No sign of scrotal abscess, cellulitis, Fournier's gangrene, torsion, masses on exam.     ED PROGRESS  Patient's ultrasound shows no acute abnormality.  No masses, torsion, epididymitis.  Chest x-ray reviewed by myself and radiologist is clear without infiltrate, edema or  pneumothorax.  EKG shows T wave inversions in inferior lateral leads but this is stable compared to his previous EKGs.  He continues to be hemodynamically stable here.  He has tolerating p.o.  I feel he is safe for discharge home.  Recommended alternating Tylenol and Motrin over-the-counter as needed for discomfort.  He has a primary care provider for outpatient follow-up.  Patient verbalized understanding and is comfortable with this plan.   At this time, I do not feel there is any life-threatening condition present. I have reviewed, interpreted and discussed all results (EKG, imaging, lab, urine as appropriate) and exam findings with patient/family. I have reviewed nursing notes and appropriate previous records.  I feel the patient is safe to be discharged home without further emergent workup and can continue workup as an outpatient as needed. Discussed usual and customary return precautions. Patient/family verbalize understanding and are comfortable with this plan.  Outpatient follow-up has been provided as needed. All questions have been answered.  ____________________________________________   FINAL CLINICAL IMPRESSION(S) / ED DIAGNOSES  Final diagnoses:  Generalized headache  Pain in  both testicles  Shortness of breath     ED Discharge Orders     None       *Please note:  Stephan Stranz. was evaluated in Emergency Department on 07/25/2021 for the symptoms described in the history of present illness. He was evaluated in the context of the global COVID-19 pandemic, which necessitated consideration that the patient might be at risk for infection with the SARS-CoV-2 virus that causes COVID-19. Institutional protocols and algorithms that pertain to the evaluation of patients at risk for COVID-19 are in a state of rapid change based on information released by regulatory bodies including the CDC and federal and state organizations. These policies and algorithms were followed during the  patient's care in the ED.  Some ED evaluations and interventions may be delayed as a result of limited staffing during and the pandemic.*   Note:  This document was prepared using Dragon voice recognition software and may include unintentional dictation errors.    Alvino Lechuga, Delice Bison, DO 07/25/21 [phone]

## 2021-08-19 ENCOUNTER — Emergency Department
Admission: EM | Admit: 2021-08-19 | Discharge: 2021-08-19 | Disposition: A | Discharge status: Home / Self Care | Payer: Self-pay | Attending: Emergency Medicine | Admitting: Emergency Medicine

## 2021-08-19 ENCOUNTER — Emergency Department: Payer: Self-pay

## 2021-08-19 ENCOUNTER — Other Ambulatory Visit: Payer: Self-pay

## 2021-08-19 ENCOUNTER — Encounter: Payer: Self-pay | Admitting: *Deleted

## 2021-08-19 DIAGNOSIS — R0602 Shortness of breath: Secondary | ICD-10-CM | POA: Insufficient documentation

## 2021-08-19 DIAGNOSIS — F209 Schizophrenia, unspecified: Secondary | ICD-10-CM | POA: Insufficient documentation

## 2021-08-19 DIAGNOSIS — F410 Panic disorder [episodic paroxysmal anxiety] without agoraphobia: Secondary | ICD-10-CM

## 2021-08-19 DIAGNOSIS — Z1339 Encounter for screening examination for other mental health and behavioral disorders: Secondary | ICD-10-CM | POA: Insufficient documentation

## 2021-08-19 DIAGNOSIS — F17210 Nicotine dependence, cigarettes, uncomplicated: Secondary | ICD-10-CM | POA: Insufficient documentation

## 2021-08-19 DIAGNOSIS — R079 Chest pain, unspecified: Secondary | ICD-10-CM | POA: Insufficient documentation

## 2021-08-19 DIAGNOSIS — Z20822 Contact with and (suspected) exposure to covid-19: Secondary | ICD-10-CM | POA: Insufficient documentation

## 2021-08-19 DIAGNOSIS — F419 Anxiety disorder, unspecified: Secondary | ICD-10-CM | POA: Insufficient documentation

## 2021-08-19 MED ORDER — HYDROXYZINE HCL 25 MG PO TABS
25.0000 mg | ORAL_TABLET | Freq: Once | ORAL | Status: AC
  Administered 2021-08-19: 25 mg via ORAL
  Filled 2021-08-19: qty 1

## 2021-08-19 NOTE — ED Provider Notes (Signed)
[facility] Emergency Department Provider Note  ____________________________________________  Time seen: Approximately 11:18 AM  I have reviewed the triage vital signs and the nursing notes.   HISTORY  Chief Complaint Shortness of Breath    HPI Sagar Tengan. is a 35 y.o. male with a history of panic attacks and schizophrenia who comes ED complaining of a panic attack.  He reports that his schizophrenia symptoms have felt a little worse for the past several days.  He was seen by his act team and behavioral medicine provider yesterday who advised him that they will increase his medication and he will have a new prescription within a few days.  He is hearing voices which she has not abnormal for him but he notes that it makes him feel bad because the voices always disparaging him.  No command hallucinations.  No SI HI, no visual hallucinations.  Last night, patient started having tightness in the chest, shortness of breath, tingling in both arms.  That is now resolved.  No exertional symptoms, not pleuritic.    Past Medical History:  Diagnosis Date   Dizziness      Patient Active Problem List   Diagnosis Date Noted   Chronic midline low back pain without sciatica 03/15/2021   Schizophrenia (HCC) 12/29/2019   Panic attacks 12/15/2018   Insomnia 03/27/2017   Anxiety with depression 02/25/2017     Past Surgical History:  Procedure Laterality Date   TONSILLECTOMY       Prior to Admission medications   Medication Sig Start Date End Date Taking? Authorizing Provider  PARoxetine (PAXIL) 20 MG tablet Take 1 tablet (20 mg total) by mouth daily. 01/01/20   Clapacs, Jackquline Denmark, MD  QUEtiapine (SEROQUEL) 100 MG tablet Take 1 tablet (100 mg total) by mouth daily. Patient taking differently: Take 100 mg by mouth 2 (two) times daily. 01/02/20   Clapacs, Jackquline [place], MD     Allergies Patient has no known allergies.   Family History  Problem Relation Age of  Onset   Lung cancer Maternal Grandfather     Social History Social History   Tobacco Use   Smoking status: Every Day    Packs/day: 0.50    Years: 14.00    Pack years: 7.00    Types: Cigarettes   Smokeless tobacco: Never  Vaping Use   Vaping Use: Never used  Substance Use Topics   Alcohol use: No   Drug use: Not Currently    Types: Marijuana    Review of Systems  Constitutional:   No fever or chills.  ENT:   No sore throat. No rhinorrhea. Cardiovascular:   Positive chest tightness without syncope. Respiratory:   No dyspnea or cough. Gastrointestinal:   Negative for abdominal pain, vomiting and diarrhea.  Musculoskeletal:   Negative for focal pain or swelling All other systems reviewed and are negative except as documented above in ROS and HPI.  ____________________________________________   PHYSICAL EXAM:  VITAL SIGNS: ED Triage Vitals  Enc Vitals Group     BP 08/19/21 0823 (!) 119/93     Pulse Rate 08/19/21 0823 74     Resp 08/19/21 0823 18     Temp 08/19/21 0823 (!) 97.5 F (36.4 C)     Temp Source 08/19/21 0823 Oral     SpO2 08/19/21 0823 93 %     Weight --      Height --      Head Circumference --  Peak Flow --      Pain Score 08/19/21 0907 0     Pain Loc --      Pain Edu? --      Excl. in [place]? --     Vital signs reviewed, nursing assessments reviewed.   Constitutional:   Alert and oriented. Non-toxic appearance. Eyes:   Conjunctivae are normal. EOMI. PERRL. ENT      Head:   Normocephalic and atraumatic.      Nose:   Wearing a mask.      Mouth/Throat:   Wearing a mask.      Neck:   No meningismus. Full ROM. Hematological/Lymphatic/Immunilogical:   No cervical lymphadenopathy. Cardiovascular:   RRR. Symmetric bilateral radial and DP pulses.  No murmurs. Cap refill less than 2 seconds. Respiratory:   Normal respiratory effort without tachypnea/retractions. Breath sounds are clear and equal bilaterally. No  wheezes/rales/rhonchi. Gastrointestinal:   Soft and nontender. Non distended. There is no CVA tenderness.  No rebound, rigidity, or guarding. Genitourinary:   deferred Musculoskeletal:   Normal range of motion in all extremities. No joint effusions.  No lower extremity tenderness.  No edema. Neurologic:   Normal speech and language.  Motor grossly intact. No acute focal neurologic deficits are appreciated.  Skin:    Skin is warm, dry and intact. No rash noted.  No petechiae, purpura, or bullae.  ____________________________________________    LABS (pertinent positives/negatives) (all labs ordered are listed, but only abnormal results are displayed) Labs Reviewed - No data to display ____________________________________________   EKG  Interpreted by me Sinus rhythm rate of 79, left axis, normal QRS and ST segments.  Slight T wave inversion in aVL which is nonspecific.  Unchanged from previous EKG July 25, 2021.  ____________________________________________    RADIOLOGY  DG Chest 2 View  Result Date: 08/19/2021 CLINICAL DATA:  Shortness of breath today. EXAM: CHEST - 2 VIEW COMPARISON:  07/25/2021 FINDINGS: The heart size and mediastinal contours are within normal limits. Both lungs are clear. The visualized skeletal structures are unremarkable. IMPRESSION: No active cardiopulmonary disease. Electronically Signed   By: Paulina Fusi M.D.   On: 08/19/2021 09:03    ____________________________________________   PROCEDURES Procedures  ____________________________________________    CLINICAL IMPRESSION / ASSESSMENT AND PLAN / ED COURSE  Medications ordered in the ED: Medications  hydrOXYzine (ATARAX) tablet 25 mg (25 mg Oral Given 08/19/21 0949)    Pertinent labs & imaging results that were available during my care of the patient were reviewed by me and considered in my medical decision making (see chart for details).  [name]. was evaluated in Emergency  Department on 08/19/2021 for the symptoms described in the history of present illness. He was evaluated in the context of the global COVID-19 pandemic, which necessitated consideration that the patient might be at risk for infection with the SARS-CoV-2 virus that causes COVID-19. Institutional protocols and algorithms that pertain to the evaluation of patients at risk for COVID-19 are in a state of rapid change based on information released by regulatory bodies including the CDC and federal and state organizations. These policies and algorithms were followed during the patient's care in the ED.   Patient presents with atypical chest pain, most likely anxiety attack. Considering the patient's symptoms, medical history, and physical examination today, I have low suspicion for ACS, PE, TAD, pneumothorax, carditis, mediastinitis, pneumonia, CHF, or sepsis. EKG unremarkable.  Chest x-ray unremarkable.  Patient refuses serum labs, and he requires cardiac work-up at  this time.  Not an imminent danger to himself or others.  He has insight and thinking is organized.  Recommend continued follow-up with his act team, increasing his medications as prescribed.      ____________________________________________   FINAL CLINICAL IMPRESSION(S) / ED DIAGNOSES    Final diagnoses:  Anxiety attack     ED Discharge Orders     None       Portions of this note were generated with dragon dictation software. Dictation errors may occur despite best attempts at proofreading.    Sharman Cheek, MD 08/19/21 1122

## 2021-08-19 NOTE — ED Notes (Signed)
Pt verbalized understanding of discharge paperwork and follow-up care. Pt reports feeling less anxious.

## 2021-08-19 NOTE — ED Notes (Signed)
Pt states that he had chest pain that started this morning. Pt thinks he had a pain attack due to being weaned off antidepressant medication. Pt cannot recall what antidepressant he takes. Pt denies chest at the moment. NAD.

## 2021-08-19 NOTE — ED Triage Notes (Signed)
Pt reports hearing voices.  Pt states SI.  Pt was seen here earlier today with similar sx.  Pt brought in by police.  Pt calm and cooperative.  Pt denies drugs or etoh use.

## 2021-08-19 NOTE — ED Notes (Signed)
Pt refusing blood work, states had it done last week

## 2021-08-19 NOTE — ED Triage Notes (Signed)
Pt comes into the ED via EMS from [employer], c/o SOB this morning, states "think Im having a panic attack". VSS, no distress noted on arrival

## 2021-08-19 NOTE — ED Notes (Signed)
Pt refusing lab work

## 2021-08-19 NOTE — ED Notes (Signed)
Pt refuses blood work at this time

## 2021-08-20 ENCOUNTER — Emergency Department
Admission: EM | Admit: 2021-08-20 | Discharge: 2021-08-20 | Disposition: A | Discharge status: Home / Self Care | Payer: Self-pay | Attending: Emergency Medicine | Admitting: Emergency Medicine

## 2021-08-20 DIAGNOSIS — M545 Low back pain, unspecified: Secondary | ICD-10-CM | POA: Diagnosis present

## 2021-08-20 DIAGNOSIS — G4700 Insomnia, unspecified: Secondary | ICD-10-CM | POA: Diagnosis present

## 2021-08-20 DIAGNOSIS — F209 Schizophrenia, unspecified: Secondary | ICD-10-CM | POA: Diagnosis present

## 2021-08-20 DIAGNOSIS — F410 Panic disorder [episodic paroxysmal anxiety] without agoraphobia: Secondary | ICD-10-CM | POA: Diagnosis present

## 2021-08-20 DIAGNOSIS — G8929 Other chronic pain: Secondary | ICD-10-CM | POA: Diagnosis present

## 2021-08-20 DIAGNOSIS — R443 Hallucinations, unspecified: Secondary | ICD-10-CM

## 2021-08-20 DIAGNOSIS — F418 Other specified anxiety disorders: Secondary | ICD-10-CM | POA: Diagnosis present

## 2021-08-20 LAB — CBC
HCT: 48.1 % (ref 39.0–52.0)
HCT: 47.2 % (ref 39.0–52.0)
Hemoglobin: 16 g/dL (ref 13.0–17.0)
Hemoglobin: 16.5 g/dL (ref 13.0–17.0)
MCH: 29.5 pg (ref 26.0–34.0)
MCH: 30.4 pg (ref 26.0–34.0)
MCHC: 33.3 g/dL (ref 30.0–36.0)
MCHC: 35 g/dL (ref 30.0–36.0)
MCV: 88.6 fL (ref 80.0–100.0)
MCV: 86.9 fL (ref 80.0–100.0)
Platelets: 239 10*3/uL (ref 150–400)
Platelets: 307 10*3/uL (ref 150–400)
RBC: 5.43 MIL/uL (ref 4.22–5.81)
RBC: 5.43 MIL/uL (ref 4.22–5.81)
RDW: 13.2 % (ref 11.5–15.5)
RDW: 12.9 % (ref 11.5–15.5)
WBC: 10.7 10*3/uL — ABNORMAL HIGH (ref 4.0–10.5)
WBC: 14.9 10*3/uL — ABNORMAL HIGH (ref 4.0–10.5)
nRBC: 0 % (ref 0.0–0.2)
nRBC: 0 % (ref 0.0–0.2)

## 2021-08-20 LAB — URINE DRUG SCREEN, QUALITATIVE (ARMC ONLY)
Amphetamines, Ur Screen: NOT DETECTED
Barbiturates, Ur Screen: NOT DETECTED
Benzodiazepine, Ur Scrn: NOT DETECTED
Cannabinoid 50 Ng, Ur ~~LOC~~: NOT DETECTED
Cocaine Metabolite,Ur ~~LOC~~: NOT DETECTED
MDMA (Ecstasy)Ur Screen: NOT DETECTED
Methadone Scn, Ur: NOT DETECTED
Opiate, Ur Screen: NOT DETECTED
Phencyclidine (PCP) Ur S: NOT DETECTED
Tricyclic, Ur Screen: POSITIVE — AB

## 2021-08-20 LAB — COMPREHENSIVE METABOLIC PANEL
ALT: 34 U/L (ref 0–44)
AST: 20 U/L (ref 15–41)
Albumin: 4.3 g/dL (ref 3.5–5.0)
Alkaline Phosphatase: 88 U/L (ref 38–126)
Anion gap: 10 (ref 5–15)
BUN: 9 mg/dL (ref 6–20)
CO2: 28 mmol/L (ref 22–32)
Calcium: 9.4 mg/dL (ref 8.9–10.3)
Chloride: 100 mmol/L (ref 98–111)
Creatinine, Ser: 1.07 mg/dL (ref 0.61–1.24)
GFR, Estimated: >60 mL/min (ref >60)
Glucose, Bld: 116 mg/dL — ABNORMAL HIGH (ref 70–99)
Potassium: 4 mmol/L (ref 3.5–5.1)
Sodium: 138 mmol/L (ref 135–145)
Total Bilirubin: 0.7 mg/dL (ref 0.3–1.2)
Total Protein: 8.2 g/dL — ABNORMAL HIGH (ref 6.5–8.1)

## 2021-08-20 LAB — RESP PANEL BY RT-PCR (FLU A&B, COVID) ARPGX2
Influenza A by PCR: NEGATIVE
Influenza B by PCR: NEGATIVE
SARS Coronavirus 2 by RT PCR: NEGATIVE

## 2021-08-20 LAB — ACETAMINOPHEN LEVEL: Acetaminophen (Tylenol), Serum: <10 ug/mL — ABNORMAL LOW (ref 10–30)

## 2021-08-20 LAB — SALICYLATE LEVEL: Salicylate Lvl: <7 mg/dL — ABNORMAL LOW (ref 7.0–30.0)

## 2021-08-20 LAB — ETHANOL: Alcohol, Ethyl (B): <10 mg/dL (ref <10)

## 2021-08-20 NOTE — ED Provider Notes (Signed)
[facility] Emergency Department Provider Note  ____________________________________________   Event Date/Time   First MD Initiated Contact with Patient 08/20/21 0033     (approximate)  I have reviewed the triage vital signs and the nursing notes.   HISTORY  Chief Complaint Psychiatric Evaluation    HPI Joshua On. is a 35 y.o. male with panic attacks, schizophrenia who comes into the ER for psychiatric evaluation.  He reports hearing voices.  Patient states that he was here earlier for chest pain but he did not tell him about how he was hearing voices.  He states the voices are telling him to hurt himself.  He states he does not have any SI or any plan to hurt himself but that these voices are disturbing to him which is why he went to be seen by psychiatry.  He reports trying to take extra of his Risperdal because "his doctor had told him to do but still having these voices.  He denies any HI, fevers, cough or any other symptoms associated with it.  On review of records it appears that he was seen here yesterday for similar.  His act team and behavioral medicine provider advised him that they will increase his medications and will have a new prescription within a few days.          Past Medical History:  Diagnosis Date   Dizziness     Patient Active Problem List   Diagnosis Date Noted   Chronic midline low back pain without sciatica 03/15/2021   Schizophrenia (HCC) 12/29/2019   Panic attacks 12/15/2018   Insomnia 03/27/2017   Anxiety with depression 02/25/2017    Past Surgical History:  Procedure Laterality Date   TONSILLECTOMY      Prior to Admission medications   Medication Sig Start Date End Date Taking? Authorizing Provider  PARoxetine (PAXIL) 20 MG tablet Take 1 tablet (20 mg total) by mouth daily. 01/01/20   Clapacs, Jackquline Denmark, MD  QUEtiapine (SEROQUEL) 100 MG tablet Take 1 tablet (100 mg total) by mouth daily. Patient taking  differently: Take 100 mg by mouth 2 (two) times daily. 01/02/20   Clapacs, Jackquline Denmark, MD    Allergies Patient has no known allergies.  Family History  Problem Relation Age of Onset   Lung cancer Maternal Grandfather     Social History Social History   Tobacco Use   Smoking status: Every Day    Packs/day: 0.50    Years: 14.00    Pack years: 7.00    Types: Cigarettes   Smokeless tobacco: Never  Vaping Use   Vaping Use: Never used  Substance Use Topics   Alcohol use: No   Drug use: Not Currently    Types: Marijuana      Review of Systems Constitutional: No fever/chills Eyes: No visual changes. ENT: No sore throat. Cardiovascular: Denies chest pain. Respiratory: Denies shortness of breath. Gastrointestinal: No abdominal pain.  No nausea, no vomiting.  No diarrhea.  No constipation. Genitourinary: Negative for dysuria. Musculoskeletal: Negative for back pain. Skin: Negative for rash. Neurological: Negative for headaches, focal weakness or numbness. Psych: Auditory hallucinations All other ROS negative ____________________________________________   PHYSICAL EXAM:  VITAL SIGNS: ED Triage Vitals  Enc Vitals Group     BP 08/19/21 2313 (!) 121/98     Pulse Rate 08/19/21 2313 (!) 102     Resp 08/19/21 2313 16     Temp 08/19/21 2313 98.1 F (36.7 C)  Temp Source 08/19/21 2313 Oral     SpO2 08/19/21 2313 97 %     Weight 08/19/21 2313 180 lb (81.6 kg)     Height 08/19/21 2313 5\' 11"  (1.803 m)     Head Circumference --      Peak Flow --      Pain Score 08/19/21 2317 0     Pain Loc --      Pain Joshua? --      Excl. in GC? --     Constitutional: Alert and oriented. Well appearing and in no acute distress. Eyes: Conjunctivae are normal. No swelling around eyes Head: Atraumatic. Nose: No congestion/rhinnorhea. Mouth/Throat: Mucous membranes are moist.   Neck: No stridor. Trachea Midline. FROM Cardiovascular: Normal rate, no swelling noted Respiratory: No  increased wob, no stridor Gastrointestinal: Soft and nontender. No distention. No abdominal bruits.  Musculoskeletal: No lower extremity tenderness nor edema.  No joint effusions. Neurologic:  Normal speech and language. No gross focal neurologic deficits are appreciated.  Skin:  Skin is warm, dry and intact. No rash noted. Psychiatric: Reports auditory hallucinations but denies any SI or plan GU: Deferred   ____________________________________________   LABS (all labs ordered are listed, but only abnormal results are displayed)  Labs Reviewed  CBC - Abnormal; Notable for the following components:      Result Value   WBC 10.7 (*)    All other components within normal limits  URINE DRUG SCREEN, QUALITATIVE ([facility] ONLY) - Abnormal; Notable for the following components:   Tricyclic, Ur Screen POSITIVE (*)    All other components within normal limits  ETHANOL  ACETAMINOPHEN LEVEL  COMPREHENSIVE METABOLIC PANEL  SALICYLATE LEVEL  ETHANOL  CBC   ____________________________________________   ____________________________________________   PROCEDURES  Procedure(s) performed (including Critical Care):  Procedures   ____________________________________________   INITIAL IMPRESSION / ASSESSMENT AND PLAN / ED COURSE  08/21/21. was evaluated in Emergency Department on 08/20/2021 for the symptoms described in the history of present illness. He was evaluated in the context of the global COVID-19 pandemic, which necessitated consideration that the patient might be at risk for infection with the SARS-CoV-2 virus that causes COVID-19. Institutional protocols and algorithms that pertain to the evaluation of patients at risk for COVID-19 are in a state of rapid change based on information released by regulatory bodies including the CDC and federal and state organizations. These policies and algorithms were followed during the patient's care in the ED.    Pt is without any acute  medical complaints. No exam findings to suggest medical cause of current presentation. Will order psychiatric screening labs and discuss further w/ psychiatric service.  Patient comes in voluntarily.  This time he denies any active plan and denies any SI.  He is willing to stay overnight and be evaluated by psychiatry in the a.m. and therefore we will hold off on IVC  D/d includes but is not limited to psychiatric disease, behavioral/personality disorder, inadequate socioeconomic support, medical.  Based on HPI, exam, unremarkable labs, no concern for acute medical problem at this time. No rigidity, clonus, hyperthermia, focal neurologic deficit, diaphoresis, tachycardia, meningismus, ataxia, gait abnormality or other finding to suggest this visit represents a non-psychiatric problem. Screening labs reviewed.    Given this, pt medically cleared, to be dispositioned per Psych.    The patient has been placed in psychiatric observation due to the need to provide a safe environment for the patient while obtaining psychiatric consultation and evaluation, as  well as ongoing medical and medication management to treat the patient's condition.  The patient has not been placed under full IVC at this time.        ____________________________________________   FINAL CLINICAL IMPRESSION(S) / ED DIAGNOSES   Final diagnoses:  Hallucinations      MEDICATIONS GIVEN DURING THIS VISIT:  Medications - No data to display   ED Discharge Orders     None        Note:  This document was prepared using Dragon voice recognition software and may include unintentional dictation errors.    Concha Se, MD 08/20/21 [phone]

## 2021-08-20 NOTE — BH Assessment (Signed)
This Clinical research associate contacted patient's ACT team with [facility] [phone] and spoke with staff member Gwynneth Aliment who reports that patient's ACT team is aware that patient went to the ED. Writer communicated that patient will be discharged later this morning. ACT team Staff member Gwynneth Aliment reports that staff will follow-up with patient today.

## 2021-08-20 NOTE — ED Provider Notes (Signed)
----------------------------------------- °  10:04 AM on 08/20/2021 ----------------------------------------- Patient has been seen and evaluated by psychiatry.  They believe the patient is safe for discharge home with outpatient follow-up.  Patient will be discharged.  Medical work-up has been nonrevealing.   Minna Antis, MD 08/20/21 1004

## 2021-08-20 NOTE — ED Notes (Signed)
Received call from the lab stating that the blood that has resulted on this pt is for another pt.  This patient at this time has not had an blood work drawn and has only had a urine sample sent down.

## 2021-08-20 NOTE — Consult Note (Signed)
[facility] Face-to-Face Psychiatry Consult   Reason for Consult:Psychiatric Evaluation Referring Physician: Dr. Fuller Plan Patient Identification: Joshua Shaffer. MRN:  [id number] Principal Diagnosis: <principal problem not specified> Diagnosis:  Active Problems:   Anxiety with depression   Insomnia   Panic attacks   Schizophrenia (HCC)   Chronic midline low back pain without sciatica   Total Time spent with patient: 1 hour  Subjective: "The voice was very intense." Joshua Shaffer. is a 35 y.o. male patient presented to [facility] ED via law enforcement under voluntary status. The patient stated he, his wife, and their three children got evicted from their home on 07/12/21, and they currently live in a hotel. The patient said he began having panic attacks. He stated that the voice started to get "bad." The patient shared he is treated for medication management by [facility] team. He discussed that he saw the psychiatrist on Tuesday, 08/19/21, and his medication was adjusted and sent to the pharmacy. He said that one of the staff members told him she would pick up his prescription from the pharmacy today (Wednesday).  The patient stated due to his mental health. The patient's UDS is unremarkable for any illicit substances. The patient shared he is not working, but his wife works.   The patient was seen face-to-face by this provider; the chart was reviewed and consulted with Dr. Fuller Plan on 08/20/2021 due to the patient's care. It was discussed with the EDP that the patient does not meet the criteria to be admitted to the psychiatric inpatient unit. The patient ACT team will be notified of his pending discharge to have his medication pick-up from the pharmacy, and he can begin his medication. On evaluation, the patient is alert and oriented x4, calm, cooperative, and mood-congruent with affect.  The patient does not appear to be responding to internal or external stimuli. Neither is the patient presenting  with any delusional thinking. The patient admits to auditory hallucinations but denies visual hallucinations. The patient denies any suicidal, homicidal, or self-harm ideations. The patient is not presenting with any psychotic or paranoid behaviors at present.  HPI: Per Dr. Fuller Plan, Joshua Shaffer. is a 35 y.o. male with panic attacks, schizophrenia who comes into the ER for psychiatric evaluation.  He reports hearing voices.  Patient states that he was here earlier for chest pain but he did not tell him about how he was hearing voices.  He states the voices are telling him to hurt himself.  He states he does not have any SI or any plan to hurt himself but that these voices are disturbing to him which is why he went to be seen by psychiatry.  He reports trying to take extra of his Risperdal because "his doctor had told him to do but still having these voices.  He denies any HI, fevers, cough or any other symptoms associated with it.   On review of records it appears that he was seen here yesterday for similar.  His act team and behavioral medicine provider advised him that they will increase his medications and will have a new prescription within a few days.     Past Psychiatric History:   Dizziness  Risk to Self:   Risk to Others:   Prior Inpatient Therapy:   Prior Outpatient Therapy:    Past Medical History:  Past Medical History:  Diagnosis Date   Dizziness     Past Surgical History:  Procedure Laterality Date   TONSILLECTOMY  Family History:  Family History  Problem Relation Age of Onset   Lung cancer Maternal Grandfather    Family Psychiatric  History: Brother -schizophrenia Social History:  Social History   Substance and Sexual Activity  Alcohol Use No     Social History   Substance and Sexual Activity  Drug Use Not Currently   Types: Marijuana    Social History   Socioeconomic History   Marital status: Married    Spouse name: Not on file   Number of children:  Not on file   Years of education: Not on file   Highest education level: Not on file  Occupational History   Not on file  Tobacco Use   Smoking status: Every Day    Packs/day: 0.50    Years: 14.00    Pack years: 7.00    Types: Cigarettes   Smokeless tobacco: Never  Vaping Use   Vaping Use: Never used  Substance and Sexual Activity   Alcohol use: No   Drug use: Not Currently    Types: Marijuana   Sexual activity: Not on file  Other Topics Concern   Not on file  Social History Narrative   Not on file   Social Determinants of Health   Financial Resource Strain: Not on file  Food Insecurity: Not on file  Transportation Needs: Not on file  Physical Activity: Not on file  Stress: Not on file  Social Connections: Not on file   Additional Social History:    Allergies:  No Known Allergies  Labs:  Results for orders placed or performed during the hospital encounter of 08/20/21 (from the past 48 hour(s))  Ethanol     Status: None   Collection Time: 08/19/21 11:19 PM  Result Value Ref Range   Alcohol, Ethyl (B) <10 <10 mg/dL    Comment: DISREGARD RESULTS PATIENT RAN IN ERROR. CALLED TO ASHLEY  ORSUTO @ 0017 ON 08/20/21.Marland KitchenMarland KitchenRH (NOTE) Lowest detectable limit for serum alcohol is 10 mg/dL.  For medical purposes only. Performed at [facility], [address] [place], [place] [address]   cbc     Status: Abnormal   Collection Time: 08/19/21 11:19 PM  Result Value Ref Range   WBC 10.7 (H) 4.0 - 10.5 K/uL    Comment: DISREGARD RESULTS PATIENT RAN IN ERROR. CALLED TO ASHLEY  ORSUTO @ 0017 ON 08/20/21.Marland KitchenMarland KitchenRH   RBC 5.43 4.22 - 5.81 MIL/uL    Comment: DISREGARD RESULTS PATIENT RAN IN ERROR. CALLED TO ASHLEY  ORSUTO @ 0017 ON 08/20/21.Marland KitchenMarland KitchenRH   Hemoglobin 16.0 13.0 - 17.0 g/dL    Comment: DISREGARD RESULTS PATIENT RAN IN ERROR. CALLED TO ASHLEY  ORSUTO @ 0017 ON 08/20/21.Marland KitchenMarland KitchenRH   HCT 48.1 39.0 - 52.0 %    Comment: DISREGARD RESULTS PATIENT RAN IN ERROR. CALLED TO ASHLEY   ORSUTO @ 0017 ON 08/20/21.Marland KitchenMarland KitchenRH   MCV 88.6 80.0 - 100.0 fL    Comment: DISREGARD RESULTS PATIENT RAN IN ERROR. CALLED TO ASHLEY  ORSUTO @ 0017 ON 08/20/21.Marland KitchenMarland KitchenRH   MCH 29.5 26.0 - 34.0 pg    Comment: DISREGARD RESULTS PATIENT RAN IN ERROR. CALLED TO ASHLEY  ORSUTO @ 0017 ON 08/20/21.Marland KitchenMarland KitchenRH   MCHC 33.3 30.0 - 36.0 g/dL    Comment: DISREGARD RESULTS PATIENT RAN IN ERROR. CALLED TO ASHLEY  ORSUTO @ 0017 ON 08/20/21.Marland KitchenMarland KitchenRH   RDW 13.2 11.5 - 15.5 %    Comment: DISREGARD RESULTS PATIENT RAN IN ERROR. CALLED TO ASHLEY  ORSUTO @ 0017 ON 08/20/21.Marland KitchenMarland KitchenRH   Platelets 239  150 - 400 K/uL    Comment: DISREGARD RESULTS PATIENT RAN IN ERROR. CALLED TO ASHLEY  ORSUTO @ 0017 ON 08/20/21.Marland KitchenMarland KitchenRH   nRBC 0.0 0.0 - 0.2 %    Comment: DISREGARD RESULTS PATIENT RAN IN ERROR. CALLED TO ASHLEY  ORSUTO @ 0017 ON 08/20/21.Marland KitchenMarland KitchenRH Performed at [facility], [address] [place], [place] [address]   Urine Drug Screen, Qualitative     Status: Abnormal   Collection Time: 08/19/21 11:19 PM  Result Value Ref Range   Tricyclic, Ur Screen POSITIVE (A) NONE DETECTED   Amphetamines, Ur Screen NONE DETECTED NONE DETECTED   MDMA (Ecstasy)Ur Screen NONE DETECTED NONE DETECTED   Cocaine Metabolite,Ur [place] NONE DETECTED NONE DETECTED   Opiate, Ur Screen NONE DETECTED NONE DETECTED   Phencyclidine (PCP) Ur S NONE DETECTED NONE DETECTED   Cannabinoid 50 Ng, Ur [place] NONE DETECTED NONE DETECTED   Barbiturates, Ur Screen NONE DETECTED NONE DETECTED   Benzodiazepine, Ur Scrn NONE DETECTED NONE DETECTED   Methadone Scn, Ur NONE DETECTED NONE DETECTED    Comment: (NOTE) Tricyclics + metabolites, urine    Cutoff 1000 ng/mL Amphetamines + metabolites, urine  Cutoff 1000 ng/mL MDMA (Ecstasy), urine              Cutoff 500 ng/mL Cocaine Metabolite, urine          Cutoff 300 ng/mL Opiate + metabolites, urine        Cutoff 300 ng/mL Phencyclidine (PCP), urine         Cutoff 25 ng/mL Cannabinoid, urine                 Cutoff 50  ng/mL Barbiturates + metabolites, urine  Cutoff 200 ng/mL Benzodiazepine, urine              Cutoff 200 ng/mL Methadone, urine                   Cutoff 300 ng/mL  The urine drug screen provides only a preliminary, unconfirmed analytical test result and should not be used for non-medical purposes. Clinical consideration and professional judgment should be applied to any positive drug screen result due to possible interfering substances. A more specific alternate chemical method must be used in order to obtain a confirmed analytical result. Gas chromatography / mass spectrometry (GC/MS) is the preferred confirm atory method. Performed at [facility], [address] [place], [place] [address]   Acetaminophen level     Status: Abnormal   Collection Time: 08/20/21  1:16 AM  Result Value Ref Range   Acetaminophen (Tylenol), Serum <10 (L) 10 - 30 ug/mL    Comment: (NOTE) Therapeutic concentrations vary significantly. A range of 10-30 ug/mL  may be an effective concentration for many patients. However, some  are best treated at concentrations outside of this range. Acetaminophen concentrations >150 ug/mL at 4 hours after ingestion  and >50 ug/mL at 12 hours after ingestion are often associated with  toxic reactions.  Performed at [facility], [address] [place], [place] [address]   Comprehensive metabolic panel     Status: Abnormal   Collection Time: 08/20/21  1:16 AM  Result Value Ref Range   Sodium 138 135 - 145 mmol/L   Potassium 4.0 3.5 - 5.1 mmol/L   Chloride 100 98 - 111 mmol/L   CO2 28 22 - 32 mmol/L   Glucose, Bld 116 (H) 70 - 99 mg/dL    Comment: Glucose reference range applies only to samples  taken after fasting for at least 8 hours.   BUN 9 6 - 20 mg/dL   Creatinine, Ser 1.61 0.61 - 1.24 mg/dL   Calcium 9.4 8.9 - 09.6 mg/dL   Total Protein 8.2 (H) 6.5 - 8.1 g/dL   Albumin 4.3 3.5 - 5.0 g/dL   AST 20 15 - 41 U/L   ALT 34 0 - 44 U/L    Alkaline Phosphatase 88 38 - 126 U/L   Total Bilirubin 0.7 0.3 - 1.2 mg/dL   GFR, Estimated >04 >54 mL/min    Comment: (NOTE) Calculated using the CKD-EPI Creatinine Equation (2021)    Anion gap 10 5 - 15    Comment: Performed at [facility], [address] [place], [place] [address]  Salicylate level     Status: Abnormal   Collection Time: 08/20/21  1:16 AM  Result Value Ref Range   Salicylate Lvl <7.0 (L) 7.0 - 30.0 mg/dL    Comment: Performed at [facility], [address] [place], [place] [address]  Ethanol     Status: None   Collection Time: 08/20/21  1:16 AM  Result Value Ref Range   Alcohol, Ethyl (B) <10 <10 mg/dL    Comment: (NOTE) Lowest detectable limit for serum alcohol is 10 mg/dL.  For medical purposes only. Performed at [facility], [address] [place], [place] [address]   CBC     Status: Abnormal   Collection Time: 08/20/21  1:16 AM  Result Value Ref Range   WBC 14.9 (H) 4.0 - 10.5 K/uL   RBC 5.43 4.22 - 5.81 MIL/uL   Hemoglobin 16.5 13.0 - 17.0 g/dL   HCT 13.0 86.5 - 78.4 %   MCV 86.9 80.0 - 100.0 fL   MCH 30.4 26.0 - 34.0 pg   MCHC 35.0 30.0 - 36.0 g/dL   RDW 69.6 29.5 - 28.4 %   Platelets 307 150 - 400 K/uL   nRBC 0.0 0.0 - 0.2 %    Comment: Performed at [facility], [address] [place], [place] [address]  Resp Panel by RT-PCR (Flu A&B, Covid) Nasopharyngeal Swab     Status: None   Collection Time: 08/20/21  1:16 AM   Specimen: Nasopharyngeal Swab; Nasopharyngeal(NP) swabs in vial transport medium  Result Value Ref Range   SARS Coronavirus 2 by RT PCR NEGATIVE NEGATIVE    Comment: (NOTE) SARS-CoV-2 target nucleic acids are NOT DETECTED.  The SARS-CoV-2 RNA is generally detectable in upper respiratory specimens during the acute phase of infection. The lowest concentration of SARS-CoV-2 viral copies this assay can detect is 138 copies/mL. A negative result does not preclude SARS-Cov-2 infection  and should not be used as the sole basis for treatment or other patient management decisions. A negative result may occur with  improper specimen collection/handling, submission of specimen other than nasopharyngeal swab, presence of viral mutation(s) within the areas targeted by this assay, and inadequate number of viral copies(<138 copies/mL). A negative result must be combined with clinical observations, patient history, and epidemiological information. The expected result is Negative.  Fact Sheet for Patients:  BloggerCourse.com  Fact Sheet for Healthcare Providers:  SeriousBroker.it  This test is no t yet approved or cleared by the [place] FDA and  has been authorized for detection and/or diagnosis of SARS-CoV-2 by FDA under an Emergency Use Authorization (EUA). This EUA will remain  in effect (meaning this test can be used) for the duration of the COVID-19 declaration under Section  564(b)(1) of the Act, 21 U.S.C.section 360bbb-3(b)(1), unless the authorization is terminated  or revoked sooner.       Influenza A by PCR NEGATIVE NEGATIVE   Influenza B by PCR NEGATIVE NEGATIVE    Comment: (NOTE) The Xpert Xpress SARS-CoV-2/FLU/RSV plus assay is intended as an aid in the diagnosis of influenza from Nasopharyngeal swab specimens and should not be used as a sole basis for treatment. Nasal washings and aspirates are unacceptable for Xpert Xpress SARS-CoV-2/FLU/RSV testing.  Fact Sheet for Patients: BloggerCourse.com  Fact Sheet for Healthcare Providers: SeriousBroker.it  This test is not yet approved or cleared by the [place] FDA and has been authorized for detection and/or diagnosis of SARS-CoV-2 by FDA under an Emergency Use Authorization (EUA). This EUA will remain in effect (meaning this test can be used) for the duration of the COVID-19 declaration under  Section 564(b)(1) of the Act, 21 U.S.C. section 360bbb-3(b)(1), unless the authorization is terminated or revoked.  Performed at [facility], [address] [place], [place] [address]     No current facility-administered medications for this encounter.   Current Outpatient Medications  Medication Sig Dispense Refill   PARoxetine (PAXIL) 20 MG tablet Take 1 tablet (20 mg total) by mouth daily. 30 tablet 1   QUEtiapine (SEROQUEL) 100 MG tablet Take 1 tablet (100 mg total) by mouth daily. (Patient taking differently: Take 100 mg by mouth 2 (two) times daily.) 30 tablet 1    Musculoskeletal: Strength & Muscle Tone: within normal limits Gait & Station: normal Patient leans: N/A  Psychiatric Specialty Exam:  Presentation  General Appearance: Appropriate for Environment  Eye Contact:Good  Speech:Clear and Coherent  Speech Volume:Decreased  Handedness:Right   Mood and Affect  Mood:Euthymic  Affect:Appropriate; Depressed; Congruent   Thought Process  Thought Processes:Coherent  Descriptions of Associations:Intact  Orientation:Full (Time, Place and Person)  Thought Content:Logical  History of Schizophrenia/Schizoaffective disorder:No data recorded Duration of Psychotic Symptoms:No data recorded Hallucinations:Hallucinations: Auditory Description of Auditory Hallucinations: "Telling me bad things."  Ideas of Reference:None  Suicidal Thoughts:Suicidal Thoughts: No  Homicidal Thoughts:Homicidal Thoughts: No   Sensorium  Memory:Immediate Good; Recent Good; Remote Good  Judgment:Fair  Insight:Good   Executive Functions  Concentration:Good  Attention Span:Good  Recall:Good  Fund of Knowledge:Good  Language:Good   Psychomotor Activity  Psychomotor Activity:Psychomotor Activity: Normal   Assets  Assets:Communication Skills; Desire for Improvement; Financial Resources/Insurance; Housing; Resilience; Social Support   Sleep   Sleep:Sleep: Fair Number of Hours of Sleep: 6   Physical Exam: Physical Exam Vitals and nursing note reviewed.  Constitutional:      Appearance: Normal appearance. He is normal weight.  HENT:     Head: Normocephalic and atraumatic.     Right Ear: External ear normal.     Left Ear: External ear normal.     Nose: Nose normal.  Cardiovascular:     Rate and Rhythm: Tachycardia present.  Pulmonary:     Effort: Pulmonary effort is normal.  Musculoskeletal:        General: Normal range of motion.     Cervical back: Normal range of motion and neck supple.  Neurological:     General: No focal deficit present.     Mental Status: He is alert and oriented to person, place, and time. Mental status is at baseline.  Psychiatric:        Attention and Perception: Attention normal. He perceives auditory hallucinations.        Mood and Affect: Mood is anxious and  depressed. Affect is blunt.        Speech: Speech normal.        Behavior: Behavior normal. Behavior is cooperative.        Thought Content: Thought content normal.        Cognition and Memory: Cognition and memory normal.        Judgment: Judgment normal.   Review of Systems  Psychiatric/Behavioral:  Positive for hallucinations. The patient is nervous/anxious.   All other systems reviewed and are negative. Blood pressure (!) 121/98, pulse (!) 102, temperature 98.1 F (36.7 C), temperature source Oral, resp. rate 16, height  (1.803 m), weight 81.6 kg, SpO2 97 %. Body mass index is 25.1 kg/m.  Treatment Plan Summary: Plan The patient ACT team will be notified of his pending discharge to have his medication pick-up from the pharmacy, and he can begin his medication.  Disposition: No evidence of imminent risk to self or others at present.   Patient does not meet criteria for psychiatric inpatient admission. Supportive therapy provided about ongoing stressors. Discussed crisis plan, support from social network, calling 911,  coming to the Emergency Department, and calling Suicide Hotline.  Gillermo Murdoch, NP 08/20/2021 3:23 AM

## 2021-08-20 NOTE — BH Assessment (Signed)
Comprehensive Clinical Assessment (CCA) Note  08/20/2021 Joshua Shaffer [id number]  Chief Complaint: Patient is a 35 year old male presenting to [facility] ED voluntarily. Per triage note Pt comes into the ED via EMS from [place] [facility], c/o SOB this morning, states "think Im having a panic attack". VSS, no distress noted on arrival. During assessment patient appears alert and oriented x4, calm and cooperative. Patient reports "I feel like I'm losing my mind, voices in my head." "I talked to my doctor and he changed my meds, I was here earlier for a panic attack but I didn't say anything about the voices in my head." Patient currently has a ACT team with [facility]. Patient also reports that he was recently evicted from his apartment and he is currently living in a hotel. Patient denies SI/HI/AH/VH and does not appear to be responding to any internal or external stimuli.  Per Psyc NP Ysidro Evert patient does not meet criteria for Inpatient Chief Complaint  Patient presents with   Psychiatric Evaluation   Visit Diagnosis: Schizophrenia    CCA Screening, Triage and Referral (STR)  Patient Reported Information How did you hear about [place]? Self  Referral name: No data recorded Referral phone number: No data recorded  Whom do you see for routine medical problems? No data recorded Practice/Facility Name: No data recorded Practice/Facility Phone Number: No data recorded Name of Contact: No data recorded Contact Number: No data recorded Contact Fax Number: No data recorded Prescriber Name: No data recorded Prescriber Address (if known): No data recorded  What Is the Reason for Your Visit/Call Today? Patient presents voluntarily due to [facility] and SI  How Long Has This Been Causing You Problems? > than 6 months  What Do You Feel Would Help You the Most Today? No data recorded  Have You Recently Been in Any Inpatient Treatment (Hospital/Detox/Crisis Center/28-Day Program)? No data  recorded Name/Location of Program/Hospital:No data recorded How Long Were You There? No data recorded When Were You Discharged? No data recorded  Have You Ever Received Services From [facility] Before? No data recorded Who Do You See at [facility]? No data recorded  Have You Recently Had Any Thoughts About Hurting Yourself? Yes  Are You Planning to Commit Suicide/Harm Yourself At This time? No   Have you Recently Had Thoughts About [place]? No  Explanation: No data recorded  Have You Used Any Alcohol or Drugs in the Past 24 Hours? No  How Long Ago Did You Use Drugs or Alcohol? No data recorded What Did You Use and How Much? No data recorded  Do You Currently Have a Therapist/Psychiatrist? Yes  Name of Therapist/Psychiatrist: [facility] ACT team   Have You Been Recently Discharged From Any Office Practice or Programs? No  Explanation of Discharge From Practice/Program: No data recorded    CCA Screening Triage Referral Assessment Type of Contact: Face-to-Face  Is this Initial or Reassessment? No data recorded Date Telepsych consult ordered in [facility]:  No data recorded Time Telepsych consult ordered in [facility]:  No data recorded  Patient Reported Information Reviewed? No data recorded Patient Left Without Being Seen? No data recorded Reason for Not Completing Assessment: No data recorded  Collateral Involvement: No data recorded  Does Patient Have a [place]? No data recorded Name and Contact of Legal Guardian: No data recorded If Minor and Not Living with Parent(s), Who has Custody? No data recorded Is CPS involved or ever been involved? Never  Is APS involved  or ever been involved? Never   Patient Determined To Be At Risk for Harm To Self or Others Based on Review of Patient Reported Information or Presenting Complaint? No  Method: No data recorded Availability of Means: No data recorded Intent: No data recorded Notification  Required: No data recorded Additional Information for Danger to Others Potential: No data recorded Additional Comments for Danger to Others Potential: No data recorded Are There Guns or Other Weapons in Your Home? No data recorded Types of Guns/Weapons: No data recorded Are These Weapons Safely Secured?                            No data recorded Who Could Verify You Are Able To Have These Secured: No data recorded Do You Have any Outstanding Charges, Pending Court Dates, Parole/Probation? No data recorded Contacted To Inform of Risk of Harm To Self or Others: No data recorded  Location of Assessment: [facility] ED   Does Patient Present under Involuntary Commitment? No  IVC Papers Initial File Date: No data recorded  [place] of Residence: [place]   Patient Currently Receiving the Following Services: ACTT Architect)   Determination of Need: Emergent (2 hours)   Options For Referral: No data recorded    CCA Biopsychosocial Intake/Chief Complaint:  No data recorded Current Symptoms/Problems: No data recorded  Patient Reported Schizophrenia/Schizoaffective Diagnosis in Past: Yes   Strengths: Patient is able to communicate his needs  Preferences: No data recorded Abilities: No data recorded  Type of Services Patient Feels are Needed: No data recorded  Initial Clinical Notes/Concerns: No data recorded  Mental Health Symptoms Depression:   Change in energy/activity; Difficulty Concentrating   Duration of Depressive symptoms:  Greater than two weeks   Mania:   None   Anxiety:    Difficulty concentrating; Fatigue   Psychosis:   Hallucinations   Duration of Psychotic symptoms:  Greater than six months   Trauma:   None   Obsessions:   None   Compulsions:   None   Inattention:   None   Hyperactivity/Impulsivity:   None   Oppositional/Defiant Behaviors:   None   Emotional Irregularity:   None   Other Mood/Personality Symptoms:   No data recorded   Mental Status Exam Appearance and self-care  Stature:   Average   Weight:   Average weight   Clothing:   Casual   Grooming:   Normal   Cosmetic use:   None   Posture/gait:   Normal   Motor activity:   Not Remarkable   Sensorium  Attention:   Normal   Concentration:   Normal   Orientation:   X5   Recall/memory:   Normal   Affect and Mood  Affect:   Appropriate   Mood:   Depressed   Relating  Eye contact:   Normal   Facial expression:   Responsive   Attitude toward examiner:   Cooperative   Thought and Language  Speech flow:  Clear and Coherent   Thought content:   Appropriate to Mood and Circumstances   Preoccupation:   None   Hallucinations:   Auditory   Organization:  No data recorded  [employer] of Knowledge:   Fair   Intelligence:   Average   Abstraction:   Normal   Judgement:   Good   Reality Testing:   Realistic   Insight:   Good   Decision Making:   Normal  Social Functioning  Social Maturity:   Responsible   Social Judgement:   Normal   Stress  Stressors:   Housing; Office manager Ability:   Normal   Skill Deficits:   None   Supports:   Family; Friends/Service system     Religion: Religion/Spirituality Are You A Religious Person?: No  Leisure/Recreation: Leisure / Recreation Do You Have Hobbies?: No  Exercise/Diet: Exercise/Diet Do You Exercise?: No Have You Gained or Lost A Significant Amount of Weight in the Past Six Months?: No Do You Follow a Special Diet?: No Do You Have Any Trouble Sleeping?: No   CCA Employment/Education Employment/Work Situation: Employment / Work Systems developer: On disability Why is Patient on Disability: Mental Health How Long has Patient Been on Disability: Unknown Has Patient ever Been in the [employer]?: No  Education: Education Is Patient Currently Attending School?: No Did You Have An  Individualized Education Program (IIEP): No Did You Have Any Difficulty At School?: No Patient's Education Has Been Impacted by Current Illness: No   CCA Family/Childhood History Family and Relationship History: Family history Marital status: Married What types of issues is patient dealing with in the relationship?: None reported Additional relationship information: None reported Does patient have children?: Yes How many children?: 3 How is patient's relationship with their children?: Patient has a good relationship with children  Childhood History:  Childhood History Did patient suffer any verbal/emotional/physical/sexual abuse as a child?: No Did patient suffer from severe childhood neglect?: No Has patient ever been sexually abused/assaulted/raped as an adolescent or adult?: No Was the patient ever a victim of a crime or a disaster?: No Witnessed domestic violence?: No Has patient been affected by domestic violence as an adult?: No  Child/Adolescent Assessment:     CCA Substance Use Alcohol/Drug Use: Alcohol / Drug Use Pain Medications: See MAR Prescriptions: See MAR Over the Counter: See MAR                         ASAM's:  Six Dimensions of Multidimensional Assessment  Dimension 1:  Acute Intoxication and/or Withdrawal Potential:      Dimension 2:  Biomedical Conditions and Complications:      Dimension 3:  Emotional, Behavioral, or Cognitive Conditions and Complications:     Dimension 4:  Readiness to Change:     Dimension 5:  Relapse, Continued use, or Continued Problem Potential:     Dimension 6:  Recovery/Living Environment:     ASAM Severity Score:    ASAM Recommended Level of Treatment:     Substance use Disorder (SUD)    Recommendations for Services/Supports/Treatments:    DSM5 Diagnoses: Patient Active Problem List   Diagnosis Date Noted   Chronic midline low back pain without sciatica 03/15/2021   Schizophrenia (HCC) 12/29/2019    Panic attacks 12/15/2018   Insomnia 03/27/2017   Anxiety with depression 02/25/2017    Patient Centered Plan: Patient is on the following Treatment Plan(s):  Impulse Control   Referrals to Alternative Service(s): Referred to Alternative Service(s):   Place:   Date:   Time:    Referred to Alternative Service(s):   Place:   Date:   Time:    Referred to Alternative Service(s):   Place:   Date:   Time:    Referred to Alternative Service(s):   Place:   Date:   Time:     Lenetta Piche A Niharika Savino, LCAS-A

## 2021-08-20 NOTE — ED Notes (Signed)
VOL/Psych Consult Completed/ Plan to D/C

## 2021-08-20 NOTE — ED Notes (Signed)
Pt given breakfast tray and a cup of juice.  

## 2021-08-20 NOTE — ED Notes (Signed)
Wife contacted for safe patient discharge. Patient and wife verbalize understanding of d/c instructions.

## 2021-08-20 NOTE — ED Notes (Signed)
Dr. Alfred Levins at the bedside for pt evaluation

## 2021-08-20 NOTE — Discharge Instructions (Addendum)
You have been seen in the emergency department for a  psychiatric concern. You have been evaluated both medically as well as psychiatrically. Please follow-up with your outpatient resources provided. Return to the emergency department for any worsening symptoms, or any thoughts of hurting yourself or anyone else so that we may attempt to help you. 

## 2021-08-20 NOTE — ED Notes (Signed)
BH provider at the bedside for pt evaluation

## 2021-09-03 ENCOUNTER — Other Ambulatory Visit: Payer: Self-pay

## 2021-09-03 ENCOUNTER — Emergency Department
Admission: EM | Admit: 2021-09-03 | Discharge: 2021-09-03 | Disposition: A | Discharge status: Other Acute Inpatient Hospital | Payer: Self-pay | Attending: Student in an Organized Health Care Education/Training Program | Admitting: Student in an Organized Health Care Education/Training Program

## 2021-09-03 DIAGNOSIS — G4700 Insomnia, unspecified: Secondary | ICD-10-CM | POA: Diagnosis present

## 2021-09-03 DIAGNOSIS — F17210 Nicotine dependence, cigarettes, uncomplicated: Secondary | ICD-10-CM | POA: Insufficient documentation

## 2021-09-03 DIAGNOSIS — Z20822 Contact with and (suspected) exposure to covid-19: Secondary | ICD-10-CM | POA: Insufficient documentation

## 2021-09-03 DIAGNOSIS — Y900 Blood alcohol level of less than 20 mg/100 ml: Secondary | ICD-10-CM | POA: Insufficient documentation

## 2021-09-03 DIAGNOSIS — F410 Panic disorder [episodic paroxysmal anxiety] without agoraphobia: Secondary | ICD-10-CM | POA: Diagnosis present

## 2021-09-03 DIAGNOSIS — F418 Other specified anxiety disorders: Secondary | ICD-10-CM | POA: Diagnosis present

## 2021-09-03 DIAGNOSIS — R443 Hallucinations, unspecified: Secondary | ICD-10-CM | POA: Insufficient documentation

## 2021-09-03 DIAGNOSIS — F209 Schizophrenia, unspecified: Secondary | ICD-10-CM | POA: Diagnosis present

## 2021-09-03 DIAGNOSIS — F200 Paranoid schizophrenia: Secondary | ICD-10-CM | POA: Diagnosis not present

## 2021-09-03 LAB — ACETAMINOPHEN LEVEL: Acetaminophen (Tylenol), Serum: <10 ug/mL — ABNORMAL LOW (ref 10–30)

## 2021-09-03 LAB — URINE DRUG SCREEN, QUALITATIVE (ARMC ONLY)
Amphetamines, Ur Screen: NOT DETECTED
Barbiturates, Ur Screen: NOT DETECTED
Benzodiazepine, Ur Scrn: NOT DETECTED
Cannabinoid 50 Ng, Ur ~~LOC~~: NOT DETECTED
Cocaine Metabolite,Ur ~~LOC~~: NOT DETECTED
MDMA (Ecstasy)Ur Screen: NOT DETECTED
Methadone Scn, Ur: NOT DETECTED
Opiate, Ur Screen: NOT DETECTED
Phencyclidine (PCP) Ur S: NOT DETECTED
Tricyclic, Ur Screen: POSITIVE — AB

## 2021-09-03 LAB — COMPREHENSIVE METABOLIC PANEL
ALT: 30 U/L (ref 0–44)
AST: 20 U/L (ref 15–41)
Albumin: 4 g/dL (ref 3.5–5.0)
Alkaline Phosphatase: 94 U/L (ref 38–126)
Anion gap: 7 (ref 5–15)
BUN: 14 mg/dL (ref 6–20)
CO2: 23 mmol/L (ref 22–32)
Calcium: 9.1 mg/dL (ref 8.9–10.3)
Chloride: 104 mmol/L (ref 98–111)
Creatinine, Ser: 1.05 mg/dL (ref 0.61–1.24)
GFR, Estimated: >60 mL/min (ref >60)
Glucose, Bld: 109 mg/dL — ABNORMAL HIGH (ref 70–99)
Potassium: 3.8 mmol/L (ref 3.5–5.1)
Sodium: 134 mmol/L — ABNORMAL LOW (ref 135–145)
Total Bilirubin: 0.5 mg/dL (ref 0.3–1.2)
Total Protein: 7.8 g/dL (ref 6.5–8.1)

## 2021-09-03 LAB — CBC
HCT: 45 % (ref 39.0–52.0)
Hemoglobin: 15.7 g/dL (ref 13.0–17.0)
MCH: 30.1 pg (ref 26.0–34.0)
MCHC: 34.9 g/dL (ref 30.0–36.0)
MCV: 86.4 fL (ref 80.0–100.0)
Platelets: 317 10*3/uL (ref 150–400)
RBC: 5.21 MIL/uL (ref 4.22–5.81)
RDW: 12.5 % (ref 11.5–15.5)
WBC: 10.9 10*3/uL — ABNORMAL HIGH (ref 4.0–10.5)
nRBC: 0 % (ref 0.0–0.2)

## 2021-09-03 LAB — SALICYLATE LEVEL: Salicylate Lvl: <7 mg/dL — ABNORMAL LOW (ref 7.0–30.0)

## 2021-09-03 LAB — RESP PANEL BY RT-PCR (FLU A&B, COVID) ARPGX2
Influenza A by PCR: NEGATIVE
Influenza B by PCR: NEGATIVE
SARS Coronavirus 2 by RT PCR: NEGATIVE

## 2021-09-03 LAB — ETHANOL: Alcohol, Ethyl (B): <10 mg/dL (ref <10)

## 2021-09-03 MED ORDER — DIAZEPAM 5 MG PO TABS
5.0000 mg | ORAL_TABLET | Freq: Four times a day (QID) | ORAL | Status: DC | PRN
  Administered 2021-09-03: 18:00:00 5 mg via ORAL
  Filled 2021-09-03: qty 1

## 2021-09-03 NOTE — ED Triage Notes (Addendum)
Pt here with paranoia. See triage note. Wife states that pt has not slept in 3 weeks and that he is hearing and seeing things that are not there. Pt is here for psychiatric help voluntarily.

## 2021-09-03 NOTE — ED Notes (Signed)
Report received from Katie, RN including situation, background, assessment and recommendations. Patient sleeping, respirations regular and unlabored. Q15 minute rounds and security camera observation to continue. Will assess patient once awake.  ?

## 2021-09-03 NOTE — ED Notes (Signed)
Pt is very insightful about mental illness.  Stated that he has had a bout of mania with insomnia for >1wk.  He stated that his auditory hallucinations have been getting worse and he is having a more difficult time distinguishing reality from his hallucinations.  Joshua Shaffer stated that he had been taking 3 different medications that seemed to be effective, however one medication caused E.D. and he was bothered by this.  He stated that since he came off this medication this ' episode ' started.  His primary 2 medications that he has been on since April he stated that he cannot remember when the last time he took them on a consistant basis; however, he stated "I don't know why I wouldn't be taking my medications" Patient has intermittent eye contact, stated he has paranoid thoughts and feelings.  Currently experiencing auditory hallucinations, denies visual.  Pt stated that he felt like his heart was racing and that he thought he was having a panic attach.  On call MD was contacted and PRN medication administered.  Staff sat with patient to allow time to talk out thoughts/feelings.  Cont to monitor pt as ordered

## 2021-09-03 NOTE — ED Triage Notes (Addendum)
Arrives voluntarily.  C/O hearing voices and believing that people are trying to kill him.  Family reports that patient has not slept in three weeks.  Patient states he feels his heart racing.  Patient is AAOx3.  Calm and cooperative at this time.  Placed in the center of triage for safety.  Patient denies SI/ HI

## 2021-09-03 NOTE — Consult Note (Signed)
[facility] Face-to-Face Psychiatry Consult   Reason for Consult:Psychiatric Evaluation Referring Physician: Dr. Roxan Hockey Patient Identification: Joshua Shaffer. MRN:  [id number] Principal Diagnosis: <principal problem not specified> Diagnosis:  Active Problems:   Anxiety with depression   Insomnia   Panic attacks   Schizophrenia (HCC)   Total Time spent with patient: 1 hour  Subjective: "I having been sleeping." Joshua Shaffer. is a 35 y.o. male patient [name]. The patient stated he, his wife, and their three children got evicted from their home on 07/12/21, and they currently are all scattered around living with friends and families, which is a significant stressor for him. The patient was seen sleeping, and he admitted to having been sleeping.  The patient shared his medications are managed by [facility] Act team. The patient stated due to his mental health. The patient's UDS is unremarkable for any illicit substances. The patient shared he is not working, but his wife works.   The patient was seen face-to-face by this provider; the chart was reviewed and consulted with Dr. Erma Heritage on 09/03/2021 due to the patient's care. On evaluation, the patient is alert and oriented x4, calm, cooperative, and mood-congruent with affect. It was discussed with the EDP that the patient does meet the criteria to be admitted to the psychiatric inpatient unit.  The patient does not appear to be responding to internal or external stimuli. Neither is the patient presenting with any delusional thinking. The patient admits to auditory hallucinations but denies visual hallucinations. The patient denies any suicidal, homicidal, or self-harm ideations. The patient is not presenting with any psychotic or paranoid behaviors at present.  HPI: Per Dr. Roxan Hockey, Joshua Shaffer. is a 35 y.o. male with a history of psychiatric illness previously on Seroquel presents to the ER for hallucinations states  that he is hearing voices saying "he is about to do it.  "He is becoming increasingly paranoid over the past 3 weeks.  Having increasing panic he is not sleeping not eating.  He is here with his wife who brought him to the ER at the direction of their psychiatrist saying that he needed to be committed for psychiatric evaluation and admission.  Past Psychiatric History: History reviewed. No pertinent past psychiatric history  Risk to Self:   Risk to Others:   Prior Inpatient Therapy:   Prior Outpatient Therapy:    Past Medical History:  Past Medical History:  Diagnosis Date   Dizziness     Past Surgical History:  Procedure Laterality Date   TONSILLECTOMY     Family History:  Family History  Problem Relation Age of Onset   Lung cancer Maternal Grandfather    Family Psychiatric  History:  Social History:  Social History   Substance and Sexual Activity  Alcohol Use No     Social History   Substance and Sexual Activity  Drug Use Not Currently   Types: Marijuana    Social History   Socioeconomic History   Marital status: Married    Spouse name: Not on file   Number of children: Not on file   Years of education: Not on file   Highest education level: Not on file  Occupational History   Not on file  Tobacco Use   Smoking status: Every Day    Packs/day: 0.50    Years: 14.00    Pack years: 7.00    Types: Cigarettes   Smokeless tobacco: Former    Types: Sports administrator  Tobacco comments:    Pt refused counseling  Vaping Use   Vaping Use: Never used  Substance and Sexual Activity   Alcohol use: No   Drug use: Not Currently    Types: Marijuana   Sexual activity: Yes    Comment: pt married  Other Topics Concern   Not on file  Social History Narrative   Not on file   Social Determinants of Health   Financial Resource Strain: Not on file  Food Insecurity: Not on file  Transportation Needs: Not on file  Physical Activity: Not on file  Stress: Not on file  Social  Connections: Not on file   Additional Social History:    Allergies:  No Known Allergies  Labs:  Results for orders placed or performed during the hospital encounter of 09/03/21 (from the past 48 hour(s))  Comprehensive metabolic panel     Status: Abnormal   Collection Time: 09/03/21  4:36 PM  Result Value Ref Range   Sodium 134 (L) 135 - 145 mmol/L   Potassium 3.8 3.5 - 5.1 mmol/L   Chloride 104 98 - 111 mmol/L   CO2 23 22 - 32 mmol/L   Glucose, Bld 109 (H) 70 - 99 mg/dL    Comment: Glucose reference range applies only to samples taken after fasting for at least 8 hours.   BUN 14 6 - 20 mg/dL   Creatinine, Ser 8.56 0.61 - 1.24 mg/dL   Calcium 9.1 8.9 - 31.4 mg/dL   Total Protein 7.8 6.5 - 8.1 g/dL   Albumin 4.0 3.5 - 5.0 g/dL   AST 20 15 - 41 U/L   ALT 30 0 - 44 U/L   Alkaline Phosphatase 94 38 - 126 U/L   Total Bilirubin 0.5 0.3 - 1.2 mg/dL   GFR, Estimated >97 >02 mL/min    Comment: (NOTE) Calculated using the CKD-EPI Creatinine Equation (2021)    Anion gap 7 5 - 15    Comment: Performed at [facility], [address], [place], [place] [address]  Ethanol     Status: None   Collection Time: 09/03/21  4:36 PM  Result Value Ref Range   Alcohol, Ethyl (B) <10 <10 mg/dL    Comment: (NOTE) Lowest detectable limit for serum alcohol is 10 mg/dL.  For medical purposes only. Performed at [facility], [address] [place], [place] [address]   Salicylate level     Status: Abnormal   Collection Time: 09/03/21  4:36 PM  Result Value Ref Range   Salicylate Lvl <7.0 (L) 7.0 - 30.0 mg/dL    Comment: Performed at [facility], [address] [place], [place] [address]  Acetaminophen level     Status: Abnormal   Collection Time: 09/03/21  4:36 PM  Result Value Ref Range   Acetaminophen (Tylenol), Serum <10 (L) 10 - 30 ug/mL    Comment: (NOTE) Therapeutic concentrations vary significantly. A range of 10-30 ug/mL  may be an effective  concentration for many patients. However, some  are best treated at concentrations outside of this range. Acetaminophen concentrations >150 ug/mL at 4 hours after ingestion  and >50 ug/mL at 12 hours after ingestion are often associated with  toxic reactions.  Performed at [facility], [address] [place], [place] [address]   cbc     Status: Abnormal   Collection Time: 09/03/21  4:36 PM  Result Value Ref Range   WBC 10.9 (H) 4.0 - 10.5 K/uL   RBC 5.21  4.22 - 5.81 MIL/uL   Hemoglobin 15.7 13.0 - 17.0 g/dL   HCT 92.4 26.8 - 34.1 %   MCV 86.4 80.0 - 100.0 fL   MCH 30.1 26.0 - 34.0 pg   MCHC 34.9 30.0 - 36.0 g/dL   RDW 96.2 22.9 - 79.8 %   Platelets 317 150 - 400 K/uL   nRBC 0.0 0.0 - 0.2 %    Comment: Performed at [facility], [address] [place], [place] [address]  Urine Drug Screen, Qualitative     Status: Abnormal   Collection Time: 09/03/21  4:36 PM  Result Value Ref Range   Tricyclic, Ur Screen POSITIVE (A) NONE DETECTED   Amphetamines, Ur Screen NONE DETECTED NONE DETECTED   MDMA (Ecstasy)Ur Screen NONE DETECTED NONE DETECTED   Cocaine Metabolite,Ur [place] NONE DETECTED NONE DETECTED   Opiate, Ur Screen NONE DETECTED NONE DETECTED   Phencyclidine (PCP) Ur S NONE DETECTED NONE DETECTED   Cannabinoid 50 Ng, Ur [place] NONE DETECTED NONE DETECTED   Barbiturates, Ur Screen NONE DETECTED NONE DETECTED   Benzodiazepine, Ur Scrn NONE DETECTED NONE DETECTED   Methadone Scn, Ur NONE DETECTED NONE DETECTED    Comment: (NOTE) Tricyclics + metabolites, urine    Cutoff 1000 ng/mL Amphetamines + metabolites, urine  Cutoff 1000 ng/mL MDMA (Ecstasy), urine              Cutoff 500 ng/mL Cocaine Metabolite, urine          Cutoff 300 ng/mL Opiate + metabolites, urine        Cutoff 300 ng/mL Phencyclidine (PCP), urine         Cutoff 25 ng/mL Cannabinoid, urine                 Cutoff 50 ng/mL Barbiturates + metabolites, urine  Cutoff 200 ng/mL Benzodiazepine, urine               Cutoff 200 ng/mL Methadone, urine                   Cutoff 300 ng/mL  The urine drug screen provides only a preliminary, unconfirmed analytical test result and should not be used for non-medical purposes. Clinical consideration and professional judgment should be applied to any positive drug screen result due to possible interfering substances. A more specific alternate chemical method must be used in order to obtain a confirmed analytical result. Gas chromatography / mass spectrometry (GC/MS) is the preferred confirm atory method. Performed at [facility], [address] [place], [place] [address]     Current Facility-Administered Medications  Medication Dose Route Frequency Provider Last Rate Last Admin   diazepam (VALIUM) tablet 5 mg  5 mg Oral Q6H PRN Willy Eddy, MD   5 mg at 09/03/21 1752   Current Outpatient Medications  Medication Sig Dispense Refill   PARoxetine (PAXIL) 20 MG tablet Take 1 tablet (20 mg total) by mouth daily. (Patient not taking: Reported on 09/03/2021) 30 tablet 1   QUEtiapine (SEROQUEL) 100 MG tablet Take 1 tablet (100 mg total) by mouth daily. (Patient not taking: Reported on 09/03/2021) 30 tablet 1    Musculoskeletal: Strength & Muscle Tone: within normal limits Gait & Station: normal Patient leans: N/A  Psychiatric Specialty Exam:  Presentation  General Appearance: Appropriate for Environment  Eye Contact:Good  Speech:Clear and Coherent  Speech Volume:Decreased  Handedness:Right   Mood and Affect  Mood:Euthymic  Affect:Appropriate; Depressed; Congruent   Thought Process  Thought Processes:Coherent  Descriptions  of Associations:Intact  Orientation:Full (Time, Place and Person)  Thought Content:Logical  History of Schizophrenia/Schizoaffective disorder:Yes  Duration of Psychotic Symptoms:Greater than six months  Hallucinations:Hallucinations: Auditory Description of Auditory Hallucinations: "I  just hear voices."  Ideas of Reference:Paranoia  Suicidal Thoughts:Suicidal Thoughts: No  Homicidal Thoughts:Homicidal Thoughts: No   Sensorium  Memory:Immediate Good; Recent Good; Remote Good  Judgment:Fair  Insight:Good   Executive Functions  Concentration:Good  Attention Span:Good  Recall:Good  Fund of Knowledge:Good  Language:Good   Psychomotor Activity  Psychomotor Activity:Psychomotor Activity: Normal   Assets  Assets:Communication Skills; Desire for Improvement; Financial Resources/Insurance; Housing; Resilience; Social Support   Sleep  Sleep:Sleep: Poor   Physical Exam: Physical Exam Vitals and nursing note reviewed.  Constitutional:      Appearance: Normal appearance. He is normal weight.  HENT:     Head: Normocephalic and atraumatic.     Nose: Nose normal.  Cardiovascular:     Rate and Rhythm: Normal rate.     Pulses: Normal pulses.  Pulmonary:     Effort: Pulmonary effort is normal.  Musculoskeletal:        General: Normal range of motion.     Cervical back: Normal range of motion.  Neurological:     General: No focal deficit present.     Mental Status: He is alert and oriented to person, place, and time.  Psychiatric:        Attention and Perception: Attention normal. He perceives auditory hallucinations.        Mood and Affect: Affect normal. Mood is anxious and depressed.        Speech: Speech normal.        Behavior: Behavior normal. Behavior is cooperative.        Thought Content: Thought content is paranoid.        Cognition and Memory: Cognition and memory normal.   Review of Systems  Psychiatric/Behavioral:  Positive for depression and hallucinations. The patient is nervous/anxious and has insomnia.   All other systems reviewed and are negative. Blood pressure (!) 116/95, pulse 98, temperature 98.4 F (36.9 C), temperature source Oral, resp. rate 18, height 5\' 11"  (1.803 m), weight 81.6 kg, SpO2 93 %. Body mass index is 25.09  kg/m.  Treatment Plan Summary: Medication management Patient does meet criteria for psychiatric inpatient admission  Disposition: Recommend psychiatric Inpatient admission when medically cleared. Supportive therapy provided about ongoing stressors.  , NP 09/03/2021 11:19 PM

## 2021-09-03 NOTE — ED Notes (Signed)
Skin check preformed x1 RN w/ 1 male security.  Pt noted to have 1 silver eyebrow piercing that will not come out of eyebrow. Staff explained to patient that we could allow it to stay in place as long as it does not cause any issues.  Cont to monitor as ordered

## 2021-09-03 NOTE — ED Notes (Signed)
VOLUNTARY with all papers on the chart/awaiting TTS/PSYCH consult

## 2021-09-03 NOTE — BH Assessment (Signed)
Comprehensive Clinical Assessment (CCA) Note  09/03/2021 Joshua Shaffer [id number]  Joshua Shaffer, 35 year old male who presents to [facility] ED involuntarily for treatment. Per triage note, Arrives voluntarily.  C/O hearing voices and believing that people are trying to kill him.  Family reports that patient has not slept in three weeks.  Patient states he feels his heart racing.  Patient is AAOx3.  Calm and cooperative at this time.  Placed in the center of triage for safety. Patient denies SI/ HI.    During TTS assessment pt presents alert and oriented x 4, anxious but cooperative, and mood-congruent with affect. The pt does not appear to be responding to internal or external stimuli. Neither is the pt presenting with any delusional thinking. Pt verified the information provided to triage RN.   Pt identifies his main complaint to be that he has not slept in 3 weeks and he is hearing mean voices that tell him he needs to die. Patient reports he is extremely paranoid and is constantly having panic attacks. Patient reports he started having these symptoms when he stopped taking his anti-depressant medications about a month ago. Patient states he was feeling normal so he discontinued using them. Patient reports his eating habits are good and even though he is compliant with his sleep meds, his sleep is poor. It is not working. Patient reports having other personal stressors which are causing him to be depressed. Patient states his family was recently evicted from their apartment and they are currently living in his moms shed. Patient reports he is hopeless, sad and does not know what else to do. I cant take it anymore. Pt denies using any illicit substances and alcohol. Pt has an ACT Team with [employer] in which he sees them every week. Pt denies current SI/HI/VH.    Pending disposition   Chief Complaint:  Chief Complaint  Patient presents with   Psychiatric Evaluation   Visit  Diagnosis: Schizophrenia    CCA Screening, Triage and Referral (STR)  Patient Reported Information How did you hear about [place]? Self  Referral name: No data recorded Referral phone number: No data recorded  Whom do you see for routine medical problems? No data recorded Practice/Facility Name: No data recorded Practice/Facility Phone Number: No data recorded Name of Contact: No data recorded Contact Number: No data recorded Contact Fax Number: No data recorded Prescriber Name: No data recorded Prescriber Address (if known): No data recorded  What Is the Reason for Your Visit/Call Today? Patient presents to the ED for AH and SI.  How Long Has This Been Causing You Problems? 1 wk - 1 month  What Do You Feel Would Help You the Most Today? Treatment for Depression or other mood problem; Medication(s); Housing Assistance; Social Support; Stress Management   Have You Recently Been in Any Inpatient Treatment (Hospital/Detox/Crisis Center/28-Day Program)? No data recorded Name/Location of Program/Hospital:No data recorded How Long Were You There? No data recorded When Were You Discharged? No data recorded  Have You Ever Received Services From [facility] Before? No data recorded Who Do You See at [facility]? No data recorded  Have You Recently Had Any Thoughts About Hurting Yourself? Yes  Are You Planning to Commit Suicide/Harm Yourself At This time? No   Have you Recently Had Thoughts About Hurting Someone Karolee Ohs? No  Explanation: No data recorded  Have You Used Any Alcohol or Drugs in the Past 24 Hours? No  How Long Ago Did You Use  Drugs or Alcohol? No data recorded What Did You Use and How Much? No data recorded  Do You Currently Have a Therapist/Psychiatrist? Yes  Name of Therapist/Psychiatrist: [facility]   Have You Been Recently Discharged From Any Office Practice or Programs? No  Explanation of Discharge From Practice/Program: No data recorded    CCA  Screening Triage Referral Assessment Type of Contact: Face-to-Face  Is this Initial or Reassessment? No data recorded Date Telepsych consult ordered in [facility]:  No data recorded Time Telepsych consult ordered in [facility]:  No data recorded  Patient Reported Information Reviewed? No data recorded Patient Left Without Being Seen? No data recorded Reason for Not Completing Assessment: No data recorded  Collateral Involvement: None provided   Does Patient Have a [place]? No data recorded Name and Contact of Legal Guardian: No data recorded If Minor and Not Living with Parent(s), Who has Custody? n/a  Is CPS involved or ever been involved? Never  Is APS involved or ever been involved? Never   Patient Determined To Be At Risk for Harm To Self or Others Based on Review of Patient Reported Information or Presenting Complaint? No  Method: No data recorded Availability of Means: No data recorded Intent: No data recorded Notification Required: No data recorded Additional Information for Danger to Others Potential: No data recorded Additional Comments for Danger to Others Potential: No data recorded Are There Guns or Other Weapons in Your Home? No data recorded Types of Guns/Weapons: No data recorded Are These Weapons Safely Secured?                            No data recorded Who Could Verify You Are Able To Have These Secured: No data recorded Do You Have any Outstanding Charges, Pending Court Dates, Parole/Probation? No data recorded Contacted To Inform of Risk of Harm To Self or Others: No data recorded  Location of Assessment: [facility] ED   Does Patient Present under Involuntary Commitment? Yes  IVC Papers Initial File Date: 09/03/21   [place] of Residence: [place]   Patient Currently Receiving the Following Services: ACTT Designer, fashion/clothing Treatment); Medication Management   Determination of Need: Urgent (48 hours)   Options For Referral: Inpatient  Hospitalization; Medication Management; Intensive Outpatient Therapy     CCA Biopsychosocial Intake/Chief Complaint:  No data recorded Current Symptoms/Problems: No data recorded  Patient Reported Schizophrenia/Schizoaffective Diagnosis in Past: Yes   Strengths: Patient is able to communicate and verbalize his needs.  Preferences: No data recorded Abilities: No data recorded  Type of Services Patient Feels are Needed: No data recorded  Initial Clinical Notes/Concerns: No data recorded  Mental Health Symptoms Depression:   Difficulty Concentrating; Change in energy/activity; Hopelessness; Sleep (too much or little); Tearfulness   Duration of Depressive symptoms:  Greater than two weeks   Mania:   None   Anxiety:    Difficulty concentrating; Fatigue; Sleep   Psychosis:   Hallucinations   Duration of Psychotic symptoms:  Greater than six months   Trauma:   None   Obsessions:   None   Compulsions:   None   Inattention:   None   Hyperactivity/Impulsivity:   None   Oppositional/Defiant Behaviors:   None   Emotional Irregularity:   Chronic feelings of emptiness; Potentially harmful impulsivity   Other Mood/Personality Symptoms:  No data recorded   Mental Status Exam Appearance and self-care  Stature:   Average   Weight:  Average weight   Clothing:   Casual   Grooming:   Normal   Cosmetic use:   None   Posture/gait:   Normal   Motor activity:   Not Remarkable   Sensorium  Attention:   Normal   Concentration:   Anxiety interferes   Orientation:   X5   Recall/memory:   Normal   Affect and Mood  Affect:   Anxious; Depressed; Tearful   Mood:   Depressed; Anxious; Hopeless   Relating  Eye contact:   Normal   Facial expression:   Depressed; Sad   Attitude toward examiner:   Cooperative   Thought and Language  Speech flow:  Clear and Coherent   Thought content:   Appropriate to Mood and Circumstances    Preoccupation:   None   Hallucinations:   Auditory   Organization:  No data recorded  [employer] of Knowledge:   Fair   Intelligence:   Average   Abstraction:   Normal   Judgement:   Good   Reality Testing:   Realistic   Insight:   Good   Decision Making:   Normal   Social Functioning  Social Maturity:   Responsible   Social Judgement:   Normal   Stress  Stressors:   Housing; Museum/gallery curator; Transitions   Coping Ability:   Programme researcher, broadcasting/film/video Deficits:   None   Supports:   Family; Friends/Service system     Religion:    Leisure/Recreation:    Exercise/Diet: Exercise/Diet Do You Have Any Trouble Sleeping?: Yes Explanation of Sleeping Difficulties: Patient reports he has not slept in 3 weeks.   CCA Employment/Education Employment/Work Situation: Employment / Work Technical sales engineer: On disability Why is Patient on Disability: Mental Health How Long has Patient Been on Disability: Unknown  Education:     CCA Family/Childhood History Family and Relationship History: Family history Marital status: Married What types of issues is patient dealing with in the relationship?: Patient reports his family was recently evicted from their apartment. Additional relationship information: None reported Does patient have children?: Yes How many children?: 3 How is patient's relationship with their children?: Patient has a good relationship with children  Childhood History:     Child/Adolescent Assessment:     CCA Substance Use Alcohol/Drug Use: Alcohol / Drug Use Pain Medications: See MAR Prescriptions: See MAR Over the Counter: See MAR History of alcohol / drug use?: No history of alcohol / drug abuse                         ASAM's:  Six Dimensions of Multidimensional Assessment  Dimension 1:  Acute Intoxication and/or Withdrawal Potential:      Dimension 2:  Biomedical Conditions and  Complications:      Dimension 3:  Emotional, Behavioral, or Cognitive Conditions and Complications:     Dimension 4:  Readiness to Change:     Dimension 5:  Relapse, Continued use, or Continued Problem Potential:     Dimension 6:  Recovery/Living Environment:     ASAM Severity Score:    ASAM Recommended Level of Treatment:     Substance use Disorder (SUD)    Recommendations for Services/Supports/Treatments:    DSM5 Diagnoses: Patient Active Problem List   Diagnosis Date Noted   Chronic midline low back pain without sciatica 03/15/2021   Schizophrenia ([place]) 12/29/2019   Panic attacks 12/15/2018   Insomnia 03/27/2017   Anxiety with depression 02/25/2017  Patient Centered Plan: Patient is on the following Treatment Plan(s):  Anxiety and Depression   Referrals to Alternative Service(s): Referred to Alternative Service(s):   Place:   Date:   Time:    Referred to Alternative Service(s):   Place:   Date:   Time:    Referred to Alternative Service(s):   Place:   Date:   Time:    Referred to Alternative Service(s):   Place:   Date:   Time:     Yan Pankratz Glennon Mac, Counselor, LCAS-A

## 2021-09-03 NOTE — ED Notes (Addendum)
Pt belongings:  1 black hat 1 grey hoodie 1 pair of jeans 1 pair of black sneakers 1 pair of white socks 1 grey t-shirt 1 pair of red and green plaid boxers 1 black cellphone (turned off) 1 cup of earring jewelry

## 2021-09-03 NOTE — ED Provider Notes (Signed)
[facility] Emergency Department Provider Note    Event Date/Time   First MD Initiated Contact with Patient 09/03/21 1644     (approximate)  I have reviewed the triage vital signs and the nursing notes.   HISTORY  Chief Complaint Psychiatric Evaluation    HPI Joshua Shaffer. is a 35 y.o. male with a history of psychiatric illness previously on Seroquel presents to the ER for hallucinations states that he is hearing voices saying "he is about to do it.  "He is becoming increasingly paranoid over the past 3 weeks.  Having increasing panic he is not sleeping not eating.  He is here with his wife who brought him to the ER at the direction of their psychiatrist saying that he needed to be committed for psychiatric evaluation and admission.  Past Medical History:  Diagnosis Date   Dizziness    Family History  Problem Relation Age of Onset   Lung cancer Maternal Grandfather    Past Surgical History:  Procedure Laterality Date   TONSILLECTOMY     Patient Active Problem List   Diagnosis Date Noted   Chronic midline low back pain without sciatica 03/15/2021   Schizophrenia ([place]) 12/29/2019   Panic attacks 12/15/2018   Insomnia 03/27/2017   Anxiety with depression 02/25/2017      Prior to Admission medications   Medication Sig Start Date End Date Taking? Authorizing Provider  PARoxetine (PAXIL) 20 MG tablet Take 1 tablet (20 mg total) by mouth daily. 01/01/20   Clapacs, Madie Reno, MD  QUEtiapine (SEROQUEL) 100 MG tablet Take 1 tablet (100 mg total) by mouth daily. Patient taking differently: Take 100 mg by mouth 2 (two) times daily. 01/02/20   Clapacs, Madie Reno, MD    Allergies Patient has no known allergies.    Social History Social History   Tobacco Use   Smoking status: Every Day    Packs/day: 0.50    Years: 14.00    Pack years: 7.00    Types: Cigarettes   Smokeless tobacco: Never  Vaping Use   Vaping Use: Never used  Substance Use  Topics   Alcohol use: No   Drug use: Not Currently    Types: Marijuana    Review of Systems Patient denies headaches, rhinorrhea, blurry vision, numbness, shortness of breath, chest pain, edema, cough, abdominal pain, nausea, vomiting, diarrhea, dysuria, fevers, rashes or hallucinations unless otherwise stated above in HPI. ____________________________________________   PHYSICAL EXAM:  VITAL SIGNS: Vitals:   09/03/21 1633  BP: (!) 116/95  Pulse: 98  Resp: 18  Temp: 98.4 F (36.9 C)  SpO2: 93%    Constitutional: Alert and oriented.  Eyes: Conjunctivae are normal.  Head: Atraumatic. Nose: No congestion/rhinnorhea. Mouth/Throat: Mucous membranes are moist.   Neck: No stridor. Painless ROM.  Cardiovascular: Normal rate, regular rhythm. Grossly normal heart sounds.  Good peripheral circulation. Respiratory: Normal respiratory effort.  No retractions. Lungs CTAB. Gastrointestinal: Soft and nontender. No distention. No abdominal bruits. No CVA tenderness. Genitourinary:  Musculoskeletal: No lower extremity tenderness nor edema.  No joint effusions. Neurologic:  Normal speech and language. No gross focal neurologic deficits are appreciated. No facial droop Skin:  Skin is warm, dry and intact. No rash noted. Psychiatric: Mood and affect are normal. Speech and behavior are normal.  ____________________________________________   LABS (all labs ordered are listed, but only abnormal results are displayed)  Results for orders placed or performed during the hospital encounter of 09/03/21 (from the past 24  hour(s))  Comprehensive metabolic panel     Status: Abnormal   Collection Time: 09/03/21  4:36 PM  Result Value Ref Range   Sodium 134 (L) 135 - 145 mmol/L   Potassium 3.8 3.5 - 5.1 mmol/L   Chloride 104 98 - 111 mmol/L   CO2 23 22 - 32 mmol/L   Glucose, Bld 109 (H) 70 - 99 mg/dL   BUN 14 6 - 20 mg/dL   Creatinine, Ser 1.05 0.61 - 1.24 mg/dL   Calcium 9.1 8.9 - 10.3 mg/dL    Total Protein 7.8 6.5 - 8.1 g/dL   Albumin 4.0 3.5 - 5.0 g/dL   AST 20 15 - 41 U/L   ALT 30 0 - 44 U/L   Alkaline Phosphatase 94 38 - 126 U/L   Total Bilirubin 0.5 0.3 - 1.2 mg/dL   GFR, Estimated >60 >60 mL/min   Anion gap 7 5 - 15  Ethanol     Status: None   Collection Time: 09/03/21  4:36 PM  Result Value Ref Range   Alcohol, Ethyl (B) <10 <10 mg/dL  cbc     Status: Abnormal   Collection Time: 09/03/21  4:36 PM  Result Value Ref Range   WBC 10.9 (H) 4.0 - 10.5 K/uL   RBC 5.21 4.22 - 5.81 MIL/uL   Hemoglobin 15.7 13.0 - 17.0 g/dL   HCT 45.0 39.0 - 52.0 %   MCV 86.4 80.0 - 100.0 fL   MCH 30.1 26.0 - 34.0 pg   MCHC 34.9 30.0 - 36.0 g/dL   RDW 12.5 11.5 - 15.5 %   Platelets 317 150 - 400 K/uL   nRBC 0.0 0.0 - 0.2 %   ____________________________________________  ____________________________________________   PROCEDURES  Procedure(s) performed:  Procedures    Critical Care performed: no ____________________________________________   INITIAL IMPRESSION / ASSESSMENT AND PLAN / ED COURSE  Pertinent labs & imaging results that were available during my care of the patient were reviewed by me and considered in my medical decision making (see chart for details).   DDX: Psychosis, delirium, medication effect, noncompliance, polysubstance abuse, Si, Hi, depression   Joshua Shaffer. is a 35 y.o. who presents to the ED with for evaluation of hallucinations.  Patient has psych history.  Laboratory testing was ordered to evaluation for underlying electrolyte derangement or signs of underlying organic pathology to explain today's presentation.  Based on history and physical and laboratory evaluation, it appears that the patient's presentation is 2/2 underlying psychiatric disorder and will require further evaluation and management by inpatient psychiatry.  Patient was  made an IVC due to hallucinations with paranoia.  Disposition pending psychiatric evaluation.  The patient has  been placed in psychiatric observation due to the need to provide a safe environment for the patient while obtaining psychiatric consultation and evaluation, as well as ongoing medical and medication management to treat the patient's condition.  The patient has been placed under full IVC at this time.     The patient was evaluated in Emergency Department today for the symptoms described in the history of present illness. He/she was evaluated in the context of the global COVID-19 pandemic, which necessitated consideration that the patient might be at risk for infection with the SARS-CoV-2 virus that causes COVID-19. Institutional protocols and algorithms that pertain to the evaluation of patients at risk for COVID-19 are in a state of rapid change based on information released by regulatory bodies including the CDC and federal and state organizations.  These policies and algorithms were followed during the patient's care in the ED.  As part of my medical decision making, I reviewed the following data within the electronic MEDICAL RECORD NUMBER Nursing notes reviewed and incorporated, Labs reviewed, notes from prior ED visits and [place] Controlled Substance Database   ____________________________________________   FINAL CLINICAL IMPRESSION(S) / ED DIAGNOSES  Final diagnoses:  Hallucinations      NEW MEDICATIONS STARTED DURING THIS VISIT:  New Prescriptions   No medications on file     Note:  This document was prepared using Dragon voice recognition software and may include unintentional dictation errors.    Willy Eddy, MD 09/03/21 [phone]

## 2021-09-03 NOTE — ED Notes (Signed)
Pt arrived on unit with NT x1 & Security x1.  Pt oriented to unit/unit rules.  Pt was tearful and requested to speak w/ psych dr.

## 2021-09-03 NOTE — BH Assessment (Signed)
Patient is to be admitted to [facility] by Psychiatric Nurse Practitioner Gillermo Murdoch.  Attending Physician will be Dr.  Toni Amend .   Patient has been assigned to room 319, by [facility] Charge Nurse [place].    ER staff is aware of the admission: [facility] ER Secretary   Dr. Erma Heritage, ER MD  Thayer Ohm Patient's Nurse  Rosey Bath Patient Access.

## 2021-09-03 NOTE — ED Notes (Signed)
Will assess and collect patient vital signs once awake

## 2021-09-03 NOTE — ED Notes (Signed)
INVOLUNTARY with all papers on the chart/awaiting TTS/PSYCH consult

## 2021-09-04 ENCOUNTER — Inpatient Hospital Stay
Admission: AD | Admit: 2021-09-04 | Discharge: 2021-09-08 | DRG: 885 | Disposition: A | Discharge status: Home / Self Care | Payer: 59 | Source: Intra-hospital | Attending: Psychiatry | Admitting: Psychiatry

## 2021-09-04 DIAGNOSIS — Z20822 Contact with and (suspected) exposure to covid-19: Secondary | ICD-10-CM | POA: Diagnosis present

## 2021-09-04 DIAGNOSIS — F17210 Nicotine dependence, cigarettes, uncomplicated: Secondary | ICD-10-CM | POA: Diagnosis present

## 2021-09-04 DIAGNOSIS — Z79899 Other long term (current) drug therapy: Secondary | ICD-10-CM | POA: Diagnosis not present

## 2021-09-04 DIAGNOSIS — G4700 Insomnia, unspecified: Secondary | ICD-10-CM | POA: Diagnosis present

## 2021-09-04 DIAGNOSIS — F200 Paranoid schizophrenia: Secondary | ICD-10-CM | POA: Diagnosis present

## 2021-09-04 DIAGNOSIS — G259 Extrapyramidal and movement disorder, unspecified: Secondary | ICD-10-CM | POA: Diagnosis present

## 2021-09-04 DIAGNOSIS — Z818 Family history of other mental and behavioral disorders: Secondary | ICD-10-CM | POA: Diagnosis not present

## 2021-09-04 DIAGNOSIS — F419 Anxiety disorder, unspecified: Secondary | ICD-10-CM | POA: Diagnosis present

## 2021-09-04 DIAGNOSIS — Z5900 Homelessness unspecified: Secondary | ICD-10-CM | POA: Diagnosis not present

## 2021-09-04 DIAGNOSIS — F333 Major depressive disorder, recurrent, severe with psychotic symptoms: Secondary | ICD-10-CM | POA: Diagnosis present

## 2021-09-04 LAB — LIPID PANEL
Cholesterol: 131 mg/dL (ref 0–200)
HDL: 33 mg/dL — ABNORMAL LOW (ref >40)
LDL Cholesterol: 53 mg/dL (ref 0–99)
Total CHOL/HDL Ratio: 4 RATIO
Triglycerides: 223 mg/dL — ABNORMAL HIGH (ref <150)
VLDL: 45 mg/dL — ABNORMAL HIGH (ref 0–40)

## 2021-09-04 LAB — HEMOGLOBIN A1C
Hgb A1c MFr Bld: 5.4 % (ref 4.8–5.6)
Mean Plasma Glucose: 108.28 mg/dL

## 2021-09-04 MED ORDER — GABAPENTIN 100 MG PO CAPS
100.0000 mg | ORAL_CAPSULE | Freq: Three times a day (TID) | ORAL | Status: DC
  Administered 2021-09-04 – 2021-09-08 (×13): 100 mg via ORAL
  Filled 2021-09-04 (×13): qty 1

## 2021-09-04 MED ORDER — ALUM & MAG HYDROXIDE-SIMETH 200-200-20 MG/5ML PO SUSP: 30.0000 mL | ORAL | Status: DC | PRN

## 2021-09-04 MED ORDER — ACETAMINOPHEN 325 MG PO TABS: 650.0000 mg | ORAL_TABLET | Freq: Four times a day (QID) | ORAL | Status: DC | PRN

## 2021-09-04 MED ORDER — QUETIAPINE FUMARATE 100 MG PO TABS
100.0000 mg | ORAL_TABLET | Freq: Two times a day (BID) | ORAL | Status: DC
  Administered 2021-09-04 – 2021-09-08 (×8): 100 mg via ORAL
  Filled 2021-09-04 (×8): qty 1

## 2021-09-04 MED ORDER — DIAZEPAM 5 MG PO TABS: 5.0000 mg | ORAL_TABLET | Freq: Four times a day (QID) | ORAL | Status: DC | PRN

## 2021-09-04 MED ORDER — MAGNESIUM HYDROXIDE 400 MG/5ML PO SUSP: 30.0000 mL | Freq: Every day | ORAL | Status: DC | PRN

## 2021-09-04 MED ORDER — HALOPERIDOL 1 MG PO TABS
2.0000 mg | ORAL_TABLET | Freq: Two times a day (BID) | ORAL | Status: DC
  Administered 2021-09-04 – 2021-09-07 (×6): 2 mg via ORAL
  Filled 2021-09-04 (×6): qty 2

## 2021-09-04 MED ORDER — FLUOXETINE HCL 20 MG PO CAPS
20.0000 mg | ORAL_CAPSULE | Freq: Every day | ORAL | Status: DC
  Administered 2021-09-04 – 2021-09-08 (×5): 20 mg via ORAL
  Filled 2021-09-04 (×5): qty 1

## 2021-09-04 MED ORDER — TRAZODONE HCL 50 MG PO TABS
50.0000 mg | ORAL_TABLET | Freq: Every evening | ORAL | Status: DC | PRN
  Administered 2021-09-04: 01:00:00 50 mg via ORAL
  Filled 2021-09-04: qty 1

## 2021-09-04 MED ORDER — BENZTROPINE MESYLATE 1 MG PO TABS
1.0000 mg | ORAL_TABLET | Freq: Every day | ORAL | Status: DC
  Administered 2021-09-04 – 2021-09-08 (×5): 1 mg via ORAL
  Filled 2021-09-04 (×5): qty 1

## 2021-09-04 MED ORDER — MIRTAZAPINE 15 MG PO TABS
7.5000 mg | ORAL_TABLET | Freq: Every day | ORAL | Status: DC
  Administered 2021-09-04: 22:00:00 7.5 mg via ORAL
  Filled 2021-09-04: qty 1

## 2021-09-04 NOTE — Tx Team (Signed)
Initial Treatment Plan 09/04/2021 1:12 AM Joshua Shaffer. OXB:[id number]    PATIENT STRESSORS: Loss of Apartment   Medication change or noncompliance     PATIENT STRENGTHS: Ability for insight  Motivation for treatment/growth    PATIENT IDENTIFIED PROBLEMS: Suicidal Ideation  Psychosis                   DISCHARGE CRITERIA:  Motivation to continue treatment in a less acute level of care Verbal commitment to aftercare and medication compliance  PRELIMINARY DISCHARGE PLAN: Outpatient therapy Placement in alternative living arrangements  PATIENT/FAMILY INVOLVEMENT: This treatment plan has been presented to and reviewed with the patient, Joshua Shaffer. The patient has been given the opportunity to ask questions and make suggestions.  Elmyra Ricks, RN 09/04/2021, 1:12 AM

## 2021-09-04 NOTE — Plan of Care (Signed)
Patient new to the unit tonight, hasn't had time to progress  Problem: Education: Goal: Knowledge of [place] [facility] Education information/materials will improve Outcome: Not Progressing Goal: Emotional status will improve Outcome: Not Progressing Goal: Mental status will improve Outcome: Not Progressing Goal: Verbalization of understanding the information provided will improve Outcome: Not Progressing   Problem: Safety: Goal: Periods of time without injury will increase Outcome: Not Progressing   Problem: Education: Goal: Will be free of psychotic symptoms Outcome: Not Progressing Goal: Knowledge of the prescribed therapeutic regimen will improve Outcome: Not Progressing   Problem: Safety: Goal: Ability to redirect hostility and anger into socially appropriate behaviors will improve Outcome: Not Progressing Goal: Ability to remain free from injury will improve Outcome: Not Progressing   

## 2021-09-04 NOTE — Plan of Care (Signed)
See progress note Problem: Education: Goal: Knowledge of [place] [facility] Education information/materials will improve Outcome: Progressing Goal: Emotional status will improve Outcome: Progressing Goal: Mental status will improve Outcome: Progressing Goal: Verbalization of understanding the information provided will improve Outcome: Progressing   Problem: Safety: Goal: Periods of time without injury will increase Outcome: Progressing   Problem: Education: Goal: Will be free of psychotic symptoms Outcome: Progressing Goal: Knowledge of the prescribed therapeutic regimen will improve Outcome: Progressing   Problem: Safety: Goal: Ability to redirect hostility and anger into socially appropriate behaviors will improve Outcome: Progressing Goal: Ability to remain free from injury will improve Outcome: Progressing

## 2021-09-04 NOTE — [facility] Suicide Risk Assessment (Signed)
[facility] INPATIENT:  Family/Significant Other Suicide Prevention Education  Suicide Prevention Education:  Contact Attempts: Joshua Shaffer/wife ([phone]), has been identified by the patient as the family member/significant other with whom the patient will be residing, and identified as the person(s) who will aid the patient in the event of a mental health crisis.  With written consent from the patient, two attempts were made to provide suicide prevention education, prior to and/or following the patient's discharge.  We were unsuccessful in providing suicide prevention education.  A suicide education pamphlet was given to the patient to share with family/significant other.  Date and time of first attempt:09/04/21 at 15:35 Date and time of second attempt: Second attempt needed  Joshua Shaffer 09/04/2021, 4:07 PM

## 2021-09-04 NOTE — H&P (Addendum)
Psychiatric Admission Assessment Adult  Patient Identification: Joshua Shaffer. MRN:  [id number] Date of Evaluation:  09/04/2021 Chief Complaint:  Major depressive disorder, recurrent, severe with psychosis Principal Diagnosis: Severe recurrent major depressive disorder with psychotic features (HCC) Diagnosis:  Principal Problem:   Severe recurrent major depressive disorder with psychotic features (HCC)  History of Present Illness:  35 yo male with high depression with insomnia, "I can't sleep, it won't click in (trazodone)."  He hears voices of men and women "they want me dead".  Stress brings on the voices and resolve swith calm and medicines.  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  None currently.  No suicidal ideations or past attempts, two prior to admissions in 2021 and 2022 for panic attacks, depression, and psychosis.  High anxiety, no panic attacks recently.  No homicidal ideations or paranoia or PTSD.  Reports he is not sleeping for the past three weeks after he and his family got evicted related to his 67 yo son stealing cars and other charges.  He has been living in his mother's shed, his wife and 50 yo daughter are staying in a hotel or with her mother.  When he went to the hotel is when "I freaked out."  His wife is concerned about him.  Appetite is "fair", no weight loss.    Past history of two admissions at [facility] per Chart Review with no documentation of schizophrenia, client does not endorse schizophrenia.  He is followed by Frederich Chick.  Considering this was related to stressors and resolves when things are going well along with minimal sleep related to his stressors for the past few weeks and living in a shed, will make MDD with psychosis as the primary diagnosis and leave schizophrenia as a differential.  On admission last night on 12/28: Joshua Shaffer. is a 35 y.o. male patient [facility] ED via POV voluntarily. The patient stated he, his wife, and their three  children got evicted from their home on 07/12/21, and they currently are all scattered around living with friends and families, which is a significant stressor for him. The patient was seen sleeping, and he admitted to having been sleeping.   The patient shared his medications are managed by [facility] team. The patient stated due to his mental health. The patient's UDS is unremarkable for any illicit substances. The patient shared he is not working, but his wife works.   The patient was seen face-to-face by this provider; the chart was reviewed and consulted with Dr. Erma Heritage on 09/03/2021 due to the patient's care. On evaluation, the patient is alert and oriented x4, calm, cooperative, and mood-congruent with affect. It was discussed with the EDP that the patient does meet the criteria to be admitted to the psychiatric inpatient unit.   The patient does not appear to be responding to internal or external stimuli. Neither is the patient presenting with any delusional thinking. The patient admits to auditory hallucinations but denies visual hallucinations. The patient denies any suicidal, homicidal, or self-harm ideations. The patient is not presenting with any psychotic or paranoid behaviors at present.  Associated Signs/Symptoms: Depression Symptoms:  depressed mood, anhedonia, fatigue, anxiety, disturbed sleep, Duration of Depression Symptoms: Greater than two weeks  (Hypo) Manic Symptoms:  Hallucinations, Anxiety Symptoms:  Excessive Worry, Psychotic Symptoms:  Hallucinations: Auditory PTSD Symptoms: NA Total Time spent with patient: 1.5 hours  Past Psychiatric History: depression, anxiety  Is the patient at risk to self?  Yes.    Has the patient been a risk to self in the past 6 months? Yes.    Has the patient been a risk to self within the distant past? Yes.    Is the patient a risk to others? No.  Has the patient been a risk to others in the past 6 months? No.  Has the patient  been a risk to others within the distant past? No.   Prior Inpatient Therapy:  once Prior Outpatient Therapy:  [facility]  Alcohol Screening: 1. How often do you have a drink containing alcohol?: Never 2. How many drinks containing alcohol do you have on a typical day when you are drinking?: 1 or 2 3. How often do you have six or more drinks on one occasion?: Never AUDIT-C Score: 0 4. How often during the last year have you found that you were not able to stop drinking once you had started?: Never 5. How often during the last year have you failed to do what was normally expected from you because of drinking?: Never 6. How often during the last year have you needed a first drink in the morning to get yourself going after a heavy drinking session?: Never 7. How often during the last year have you had a feeling of guilt of remorse after drinking?: Never 8. How often during the last year have you been unable to remember what happened the night before because you had been drinking?: Never 9. Have you or someone else been injured as a result of your drinking?: No 10. Has a relative or friend or a doctor or another health worker been concerned about your drinking or suggested you cut down?: No Alcohol Use Disorder Identification Test Final Score (AUDIT): 0 Substance Abuse History in the last 12 months:  No. Consequences of Substance Abuse: Legal Consequences:  in the past Previous Psychotropic Medications: Yes  Psychological Evaluations: Yes  Past Medical History:  Past Medical History:  Diagnosis Date   Dizziness     Past Surgical History:  Procedure Laterality Date   TONSILLECTOMY     Family History:  Family History  Problem Relation Age of Onset   Lung cancer Maternal Grandfather    Family Psychiatric  History: brother with schizophrenia Tobacco Screening:   Social History:  Social History   Substance and Sexual Activity  Alcohol Use No     Social History   Substance and  Sexual Activity  Drug Use Not Currently   Types: Marijuana    Additional Social History:  60, 47, 88 yo children; youngest is a girl   Allergies:  No Known Allergies Lab Results:  Results for orders placed or performed during the hospital encounter of 09/03/21 (from the past 48 hour(s))  Comprehensive metabolic panel     Status: Abnormal   Collection Time: 09/03/21  4:36 PM  Result Value Ref Range   Sodium 134 (L) 135 - 145 mmol/L   Potassium 3.8 3.5 - 5.1 mmol/L   Chloride 104 98 - 111 mmol/L   CO2 23 22 - 32 mmol/L   Glucose, Bld 109 (H) 70 - 99 mg/dL    Comment: Glucose reference range applies only to samples taken after fasting for at least 8 hours.   BUN 14 6 - 20 mg/dL   Creatinine, Ser 1.05 0.61 - 1.24 mg/dL   Calcium 9.1 8.9 - 10.3 mg/dL   Total Protein 7.8 6.5 - 8.1 g/dL   Albumin 4.0 3.5 - 5.0 g/dL  AST 20 15 - 41 U/L   ALT 30 0 - 44 U/L   Alkaline Phosphatase 94 38 - 126 U/L   Total Bilirubin 0.5 0.3 - 1.2 mg/dL   GFR, Estimated >60 >60 mL/min    Comment: (NOTE) Calculated using the CKD-EPI Creatinine Equation (2021)    Anion gap 7 5 - 15    Comment: Performed at [facility], [address] [place], [place] [address]  Ethanol     Status: None   Collection Time: 09/03/21  4:36 PM  Result Value Ref Range   Alcohol, Ethyl (B) <10 <10 mg/dL    Comment: (NOTE) Lowest detectable limit for serum alcohol is 10 mg/dL.  For medical purposes only. Performed at [facility], [address] [place], [place] [id number]   Salicylate level     Status: Abnormal   Collection Time: 09/03/21  4:36 PM  Result Value Ref Range   Salicylate Lvl [id number] (L) 7.0 - 30.0 mg/dL    Comment: Performed at [facility], [address] [place], [place] [address]  Acetaminophen level     Status: Abnormal   Collection Time: 09/03/21  4:36 PM  Result Value Ref Range   Acetaminophen (Tylenol), Serum <10 (L) 10 - 30 ug/mL    Comment: (NOTE) Therapeutic  concentrations vary significantly. A range of 10-30 ug/mL  may be an effective concentration for many patients. However, some  are best treated at concentrations outside of this range. Acetaminophen concentrations >150 ug/mL at 4 hours after ingestion  and >50 ug/mL at 12 hours after ingestion are often associated with  toxic reactions.  Performed at [facility], [address] [place], [place] [address]   cbc     Status: Abnormal   Collection Time: 09/03/21  4:36 PM  Result Value Ref Range   WBC 10.9 (H) 4.0 - 10.5 K/uL   RBC 5.21 4.22 - 5.81 MIL/uL   Hemoglobin 15.7 13.0 - 17.0 g/dL   HCT 45.0 39.0 - 52.0 %   MCV 86.4 80.0 - 100.0 fL   MCH 30.1 26.0 - 34.0 pg   MCHC 34.9 30.0 - 36.0 g/dL   RDW 12.5 11.5 - 15.5 %   Platelets 317 150 - 400 K/uL   nRBC 0.0 0.0 - 0.2 %    Comment: Performed at [facility], [address], [place],  [address]  Urine Drug Screen, Qualitative     Status: Abnormal   Collection Time: 09/03/21  4:36 PM  Result Value Ref Range   Tricyclic, Ur Screen POSITIVE (A) NONE DETECTED   Amphetamines, Ur Screen NONE DETECTED NONE DETECTED   MDMA (Ecstasy)Ur Screen NONE DETECTED NONE DETECTED   Cocaine Metabolite,Ur [place] NONE DETECTED NONE DETECTED   Opiate, Ur Screen NONE DETECTED NONE DETECTED   Phencyclidine (PCP) Ur S NONE DETECTED NONE DETECTED   Cannabinoid 50 Ng, Ur [place] NONE DETECTED NONE DETECTED   Barbiturates, Ur Screen NONE DETECTED NONE DETECTED   Benzodiazepine, Ur Scrn NONE DETECTED NONE DETECTED   Methadone Scn, Ur NONE DETECTED NONE DETECTED    Comment: (NOTE) Tricyclics + metabolites, urine    Cutoff 1000 ng/mL Amphetamines + metabolites, urine  Cutoff 1000 ng/mL MDMA (Ecstasy), urine              Cutoff 500 ng/mL Cocaine Metabolite, urine          Cutoff 300 ng/mL Opiate + metabolites, urine        Cutoff 300 ng/mL Phencyclidine (  PCP), urine         Cutoff 25 ng/mL Cannabinoid, urine                 Cutoff 50  ng/mL Barbiturates + metabolites, urine  Cutoff 200 ng/mL Benzodiazepine, urine              Cutoff 200 ng/mL Methadone, urine                   Cutoff 300 ng/mL  The urine drug screen provides only a preliminary, unconfirmed analytical test result and should not be used for non-medical purposes. Clinical consideration and professional judgment should be applied to any positive drug screen result due to possible interfering substances. A more specific alternate chemical method must be used in order to obtain a confirmed analytical result. Gas chromatography / mass spectrometry (GC/MS) is the preferred confirm atory method. Performed at [facility], [address] [place], [place] [address]   Resp Panel by RT-PCR (Flu A&B, Covid) Nasopharyngeal Swab     Status: None   Collection Time: 09/03/21 10:31 PM   Specimen: Nasopharyngeal Swab; Nasopharyngeal(NP) swabs in vial transport medium  Result Value Ref Range   SARS Coronavirus 2 by RT PCR NEGATIVE NEGATIVE    Comment: (NOTE) SARS-CoV-2 target nucleic acids are NOT DETECTED.  The SARS-CoV-2 RNA is generally detectable in upper respiratory specimens during the acute phase of infection. The lowest concentration of SARS-CoV-2 viral copies this assay can detect is 138 copies/mL. A negative result does not preclude SARS-Cov-2 infection and should not be used as the sole basis for treatment or other patient management decisions. A negative result may occur with  improper specimen collection/handling, submission of specimen other than nasopharyngeal swab, presence of viral mutation(s) within the areas targeted by this assay, and inadequate number of viral copies(<138 copies/mL). A negative result must be combined with clinical observations, patient history, and epidemiological information. The expected result is Negative.  Fact Sheet for Patients:  EntrepreneurPulse.com.au  Fact Sheet for Healthcare  Providers:  IncredibleEmployment.be  This test is no t yet approved or cleared by the [place] FDA and  has been authorized for detection and/or diagnosis of SARS-CoV-2 by FDA under an Emergency Use Authorization (EUA). This EUA will remain  in effect (meaning this test can be used) for the duration of the COVID-19 declaration under Section 564(b)(1) of the Act, 21 U.S.C.section 360bbb-3(b)(1), unless the authorization is terminated  or revoked sooner.       Influenza A by PCR NEGATIVE NEGATIVE   Influenza B by PCR NEGATIVE NEGATIVE    Comment: (NOTE) The Xpert Xpress SARS-CoV-2/FLU/RSV plus assay is intended as an aid in the diagnosis of influenza from Nasopharyngeal swab specimens and should not be used as a sole basis for treatment. Nasal washings and aspirates are unacceptable for Xpert Xpress SARS-CoV-2/FLU/RSV testing.  Fact Sheet for Patients: EntrepreneurPulse.com.au  Fact Sheet for Healthcare Providers: IncredibleEmployment.be  This test is not yet approved or cleared by the [place] FDA and has been authorized for detection and/or diagnosis of SARS-CoV-2 by FDA under an Emergency Use Authorization (EUA). This EUA will remain in effect (meaning this test can be used) for the duration of the COVID-19 declaration under Section 564(b)(1) of the Act, 21 U.S.C. section 360bbb-3(b)(1), unless the authorization is terminated or revoked.  Performed at [facility], [address], [place], [place] [address]     Blood Alcohol level:  Lab Results  Component Value Date  ETH <10 09/03/2021   ETH <10 08/20/2021    Metabolic Disorder Labs:  Lab Results  Component Value Date   HGBA1C 5.5 12/14/2018   MPG 111.15 12/14/2018   No results found for: PROLACTIN Lab Results  Component Value Date   CHOL 91 12/14/2018   TRIG 92 12/14/2018   HDL 49 12/14/2018   CHOLHDL 1.9 12/14/2018   VLDL 18  12/14/2018   LDLCALC 24 12/14/2018    Current Medications: Current Facility-Administered Medications  Medication Dose Route Frequency Provider Last Rate Last Admin   acetaminophen (TYLENOL) tablet 650 mg  650 mg Oral Q6H PRN Gillermo Murdochhompson, Jacqueline, NP       alum & mag hydroxide-simeth (MAALOX/MYLANTA) 200-200-20 MG/5ML suspension 30 mL  30 mL Oral Q4H PRN Gillermo Murdochhompson, Jacqueline, NP       magnesium hydroxide (MILK OF MAGNESIA) suspension 30 mL  30 mL Oral Daily PRN Gillermo Murdochhompson, Jacqueline, NP       QUEtiapine (SEROQUEL) tablet 100 mg  100 mg Oral BID Charm RingsLord, Maye Parkinson Y, NP       traZODone (DESYREL) tablet 50 mg  50 mg Oral QHS PRN Gillermo Murdochhompson, Jacqueline, NP   50 mg at 09/04/21 0059   PTA Medications: Medications Prior to Admission  Medication Sig Dispense Refill Last Dose   PARoxetine (PAXIL) 20 MG tablet Take 1 tablet (20 mg total) by mouth daily. (Patient not taking: Reported on 09/03/2021) 30 tablet 1    QUEtiapine (SEROQUEL) 100 MG tablet Take 1 tablet (100 mg total) by mouth daily. (Patient not taking: Reported on 09/03/2021) 30 tablet 1     Musculoskeletal: Strength & Muscle Tone: within normal limits Gait & Station: normal Patient leans: N/A  Psychiatric Specialty Exam: Physical Exam Vitals and nursing note reviewed.  Constitutional:      Appearance: Normal appearance.  HENT:     Head: Normocephalic.     Nose: Nose normal.  Pulmonary:     Effort: Pulmonary effort is normal.  Musculoskeletal:        General: Normal range of motion.     Cervical back: Normal range of motion.  Neurological:     General: No focal deficit present.     Mental Status: He is alert and oriented to person, place, and time.  Psychiatric:        Attention and Perception: Attention normal. He perceives auditory hallucinations.        Mood and Affect: Mood is anxious and depressed. Affect is blunt.        Speech: Speech normal.        Behavior: Behavior normal. Behavior is cooperative.        Thought  Content: Thought content is paranoid.        Cognition and Memory: Cognition and memory normal.        Judgment: Judgment normal.    Review of Systems  Psychiatric/Behavioral:  Positive for depression. The patient is nervous/anxious.   All other systems reviewed and are negative.  Blood pressure 116/83, pulse 77, temperature 98.1 F (36.7 C), temperature source Oral, resp. rate 17, height 5\' 11"  (1.803 m), weight 81.6 kg, SpO2 99 %.Body mass index is 25.09 kg/m.  General Appearance: Casual  Eye Contact:  Fair  Speech:  Normal Rate  Volume:  Normal  Mood:  Anxious and Depressed  Affect:  Blunt  Thought Process:  Coherent  Orientation:  Full (Time, Place, and Person)  Thought Content:  Hallucinations: Auditory and Rumination  Suicidal Thoughts:  No  Homicidal Thoughts:  No  Memory:  Immediate;   Fair Recent;   Fair Remote;   Fair  Judgement:  Poor  Insight:  Fair  Psychomotor Activity:  Decreased  Concentration:  Concentration: Fair and Attention Span: Fair  Recall:  [employer] of Knowledge:  Fair  Language:  Good  Akathisia:  No  Handed:  Right  AIMS (if indicated):     Assets:  Leisure Time Physical Health Resilience Social Support  ADL's:  Intact  Cognition:  WNL  Sleep:         Physical Exam: Physical Exam Vitals and nursing note reviewed.  Constitutional:      Appearance: Normal appearance.  HENT:     Head: Normocephalic.     Nose: Nose normal.  Pulmonary:     Effort: Pulmonary effort is normal.  Musculoskeletal:        General: Normal range of motion.     Cervical back: Normal range of motion.  Neurological:     General: No focal deficit present.     Mental Status: He is alert and oriented to person, place, and time.  Psychiatric:        Attention and Perception: Attention normal. He perceives auditory hallucinations.        Mood and Affect: Mood is anxious and depressed. Affect is blunt.        Speech: Speech normal.        Behavior: Behavior  normal. Behavior is cooperative.        Thought Content: Thought content is paranoid.        Cognition and Memory: Cognition and memory normal.        Judgment: Judgment normal.   Review of Systems  Psychiatric/Behavioral:  Positive for depression. The patient is nervous/anxious.   All other systems reviewed and are negative. Blood pressure 116/83, pulse 77, temperature 98.1 F (36.7 C), temperature source Oral, resp. rate 17, height 5\' 11"  (1.803 m), weight 81.6 kg, SpO2 99 %. Body mass index is 25.09 kg/m.  Treatment Plan Summary: Daily contact with patient to assess and evaluate symptoms and progress in treatment, Medication management, and Plan : Major depressive disorder, recurrent, severe with psychosis: -Started Prozac 20 mg daily -Continue Seroquel 100 mg BID -Started haldol 2 mg BID  EPS: -started cogentin 1 mg daily  Anxiety: -started gabapentin 100 mg TID  Insomnia: -started Remeron 7.5 mg at bedtime  Observation Level/Precautions:  15 minute checks  Laboratory:  completed in the ED, A1C and lipid panel order  Psychotherapy:   individual and group therapy  Medications:  Risperdal, Seroquel  Consultations:  none   Discharge Concerns:  housing  Estimated LOS: 5-7 days  Other:     Physician Treatment Plan for Primary Diagnosis: Severe recurrent major depressive disorder with psychotic features (HCC) Long Term Goal(s): Improvement in symptoms so as ready for discharge  Short Term Goals: Ability to identify changes in lifestyle to reduce recurrence of condition will improve, Ability to verbalize feelings will improve, Ability to disclose and discuss suicidal ideas, Ability to demonstrate self-control will improve, Ability to identify and develop effective coping behaviors will improve, Ability to maintain clinical measurements within normal limits will improve, and Compliance with prescribed medications will improve  Physician Treatment Plan for Secondary Diagnosis:  Principal Problem:   Severe recurrent major depressive disorder with psychotic features (HCC)  Long Term Goal(s): Improvement in symptoms so as ready for discharge  Short Term Goals: Ability to identify changes in lifestyle to reduce recurrence of  condition will improve, Ability to verbalize feelings will improve, Ability to disclose and discuss suicidal ideas, Ability to demonstrate self-control will improve, Ability to identify and develop effective coping behaviors will improve, Ability to maintain clinical measurements within normal limits will improve, and Compliance with prescribed medications will improve  I certify that inpatient services furnished can reasonably be expected to improve the patient's condition.    Waylan Boga, NP 12/29/202211:20 AM

## 2021-09-04 NOTE — Progress Notes (Signed)
Recreation Therapy Notes   Date: 09/04/2021  Time: 10:15 am    Location:  Craft room    Behavioral response: N/A   Intervention Topic: Problem Solving     Discussion/Intervention: Patient did not attend group.  Clinical Observations/Feedback:  Patient did not attend group.   Gleason Ardoin LRT/CTRS         Krystle Oberman 09/04/2021 12:06 PM

## 2021-09-04 NOTE — Progress Notes (Signed)
Pt in room  and in bed most of shift. Visible on the unit for brief periods of time and no interaction with peers or staff. Patient denies SI/HI and visual hallucinations. He reports auditory hallucinations on admission, but none now. He denies having anxiety, but reports feelings of depression. Pt guarded about discussing what brought him to the hospital and he did not share any details with me.

## 2021-09-04 NOTE — Group Note (Signed)
[facility] LCSW Group Therapy Note   Group Date: 09/04/2021 Start Time: 1300 End Time: 1400  Type of Therapy and Topic:  Group Therapy:  Feelings around Relapse and Recovery  Participation Level:  Did Not Attend   Mood:  Description of Group:    Patients in this group will discuss emotions they experience before and after a relapse. They will process how experiencing these feelings, or avoidance of experiencing them, relates to having a relapse. Facilitator will guide patients to explore emotions they have related to recovery. Patients will be encouraged to process which emotions are more powerful. They will be guided to discuss the emotional reaction significant others in their lives may have to patients relapse or recovery. Patients will be assisted in exploring ways to respond to the emotions of others without this contributing to a relapse.  Therapeutic Goals: Patient will identify two or more emotions that lead to relapse for them:  Patient will identify two emotions that result when they relapse:  Patient will identify two emotions related to recovery:  Patient will demonstrate ability to communicate their needs through discussion and/or role plays.   Summary of Patient Progress:  Patient did not attend group despite encouraged participation.    Therapeutic Modalities:   Cognitive Behavioral Therapy Solution-Focused Therapy Assertiveness Training Relapse Prevention Therapy   Corky Crafts, [place]

## 2021-09-04 NOTE — Progress Notes (Signed)
Patient denies pain and depression. Patient reported not sleeping well stating he thinks his anxiety was keeping him from sleeping. Patient denies SI, HI, and VH. Patient reports auditory hallucinations. Patient states he cant not hear exactly what the voices are saying and that he hears mumbles. Patient states that the voices are "not as bad" compared to time of admission. Patient has been pleasant and cooperative. Patient compliant with medication administration. Patient isolative to room with the exception of coming out for meals.  Q15 minute checks maintained. Patient remains safe on the unit at this time.

## 2021-09-04 NOTE — [facility] Suicide Risk Assessment (Cosign Needed)
[facility] Admission Suicide Risk Assessment   Nursing information obtained from:  Patient Demographic factors:  Male, Low socioeconomic status Current Mental Status:  Self-harm thoughts Loss Factors:  Financial problems / change in socioeconomic status Historical Factors:  Impulsivity Risk Reduction Factors:  Positive therapeutic relationship, Living with another person, especially a relative, Sense of responsibility to family  Total Time spent with patient: 1.5 hours Principal Problem: Severe recurrent major depressive disorder with psychotic features ([place]) Diagnosis:  Principal Problem:   Severe recurrent major depressive disorder with psychotic features ([place])  Subjective Data: Depression severe, multiple stressors, high anxiety, insomnia, hopeless, auditory hallucinations  Continued Clinical Symptoms:  Alcohol Use Disorder Identification Test Final Score (AUDIT): 0 The "Alcohol Use Disorders Identification Test", Guidelines for Use in Primary Care, Second Edition.  World Pharmacologist [facility]). Score between 0-7:  no or low risk or alcohol related problems. Score between 8-15:  moderate risk of alcohol related problems. Score between 16-19:  high risk of alcohol related problems. Score 20 or above:  warrants further diagnostic evaluation for alcohol dependence and treatment.   CLINICAL FACTORS:   Depression:   Anhedonia Severe  Musculoskeletal: Strength & Muscle Tone: within normal limits Gait & Station: normal Patient leans: N/A   Psychiatric Specialty Exam: Physical Exam Vitals and nursing note reviewed.  Constitutional:      Appearance: Normal appearance.  HENT:     Head: Normocephalic.     Nose: Nose normal.  Pulmonary:     Effort: Pulmonary effort is normal.  Musculoskeletal:        General: Normal range of motion.     Cervical back: Normal range of motion.  Neurological:     General: No focal deficit present.     Mental Status: He is alert and oriented to  person, place, and time.  Psychiatric:        Attention and Perception: Attention normal. He perceives auditory hallucinations.        Mood and Affect: Mood is anxious and depressed. Affect is blunt.        Speech: Speech normal.        Behavior: Behavior normal. Behavior is cooperative.        Thought Content: Thought content is paranoid.        Cognition and Memory: Cognition and memory normal.        Judgment: Judgment normal.     Review of Systems  Psychiatric/Behavioral:  Positive for depression. The patient is nervous/anxious.   All other systems reviewed and are negative.  Blood pressure 116/83, pulse 77, temperature 98.1 F (36.7 C), temperature source Oral, resp. rate 17, height 5\' 11"  (1.803 m), weight 81.6 kg, SpO2 99 %.Body mass index is 25.09 kg/m.  General Appearance: Casual  Eye Contact:  Fair  Speech:  Normal Rate  Volume:  Normal  Mood:  Anxious and Depressed  Affect:  Blunt  Thought Process:  Coherent  Orientation:  Full (Time, Place, and Person)  Thought Content:  Hallucinations: Auditory and Rumination  Suicidal Thoughts:  No  Homicidal Thoughts:  No  Memory:  Immediate;   Fair Recent;   Fair Remote;   Fair  Judgement:  Poor  Insight:  Fair  Psychomotor Activity:  Decreased  Concentration:  Concentration: Fair and Attention Span: Fair  Recall:  [employer] of Knowledge:  Fair  Language:  Good  Akathisia:  No  Handed:  Right  AIMS (if indicated):     Assets:  Leisure Time Physical Health Resilience  Social Support  ADL's:  Intact  Cognition:  WNL  Sleep:        Physical Exam: Physical Exam Vitals and nursing note reviewed.  Constitutional:      Appearance: Normal appearance.  HENT:     Head: Normocephalic.     Nose: Nose normal.  Pulmonary:     Effort: Pulmonary effort is normal.  Musculoskeletal:        General: Normal range of motion.     Cervical back: Normal range of motion.  Neurological:     General: No focal deficit present.      Mental Status: He is alert and oriented to person, place, and time.  Psychiatric:        Attention and Perception: Attention normal. He perceives auditory hallucinations.        Mood and Affect: Mood is anxious and depressed.        Speech: Speech normal.        Behavior: Behavior normal. Behavior is cooperative.        Thought Content: Thought content normal.        Cognition and Memory: Cognition and memory normal.        Judgment: Judgment normal.   Review of Systems  Constitutional:  Positive for malaise/fatigue.  Psychiatric/Behavioral:  Positive for depression and hallucinations. The patient is nervous/anxious.   All other systems reviewed and are negative. Blood pressure 116/83, pulse 77, temperature 98.1 F (36.7 C), temperature source Oral, resp. rate 17, height 5\' 11"  (1.803 m), weight 81.6 kg, SpO2 99 %. Body mass index is 25.09 kg/m.   COGNITIVE FEATURES THAT CONTRIBUTE TO RISK:  None    SUICIDE RISK:   Minimal: No identifiable suicidal ideation.  Patients presenting with no risk factors but with morbid ruminations; may be classified as minimal risk based on the severity of the depressive symptoms  PLAN OF CARE:  Major depressive disorder, recurrent, severe with psychosis: -Started Prozac 20 mg daily -Continue Seroquel 100 mg BID -Started haldol 2 mg BID -Individual and group therapty  EPS: -started cogentin 1 mg daily  Anxiety: -started gabapentin 100 mg TID  Insomnia: -started Remeron 7.5 mg at bedtime  I certify that inpatient services furnished can reasonably be expected to improve the patient's condition.   , NP 09/04/2021, 12:26 PM

## 2021-09-04 NOTE — Progress Notes (Signed)
Patient admitted from [facility], report received from Phil Campbell, [place]. Patient calm and pleasant during assessment. Pt denies SI/HI. Pt endorses anxiety and depression. Pt endorses hearing voices telling him he should be dead. Patient verbally contracts for safety with staff. Patient oriented to the unit and his room and given snack. Pt compliant with medication administration per MD orders. Pt given education, support, and encouragement to be active in his treatment plan. Pt being monitored Q 15 minutes for safety per unit protocol. Pt remains safe on the unit.
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
## 2021-09-05 DIAGNOSIS — F333 Major depressive disorder, recurrent, severe with psychotic symptoms: Secondary | ICD-10-CM | POA: Diagnosis not present

## 2021-09-05 LAB — ETHANOL

## 2021-09-05 MED ORDER — MIRTAZAPINE 15 MG PO TABS
15.0000 mg | ORAL_TABLET | Freq: Every day | ORAL | Status: DC
  Administered 2021-09-05 – 2021-09-07 (×3): 15 mg via ORAL
  Filled 2021-09-05 (×3): qty 1

## 2021-09-05 NOTE — Progress Notes (Signed)
[facility] MD Progress Note  09/05/2021 2:44 PM [name].  MRN:  [id number]  Principal Problem: Severe recurrent major depressive disorder with psychotic features ([place]) Diagnosis: Principal Problem:   Severe recurrent major depressive disorder with psychotic features [facility])  Patient is a  35y.o. male with MDD with psychosis vs. Schizoaffective disorder, who presents to the [facility] unit due to severe depression, insomnia and psychosis.   Interval History Patient was seen today for re-evaluation.  Nursing reports no events overnight. The patient has no issues with performing ADLs.  Patient has been medication compliant.    Subjective:  On assessment patient reports "I am feeling better" after medications were restarted. He reports that his mood seems to be improving - he is less depressed. He is less anxious too, says "my heart is not pounding anymore". His sleep is "not too good yet". He continues to report psychotic symptoms "I still heal stuff, but not bad". (On admission, he reported "voices of men and women "they want me dead").  He denies suicidal/homicidal ideations. The patient reports no side effects from medications. He is homeless, but thinks he can stay with his mother. He reports having an ACT team [facility].  Labs: no new results for review.  Total Time spent with patient: 30 minutes  Past Psychiatric History: Past history of two admissions at [facility] per with no documentation of schizophrenia. He is followed by [facility].  Past Medical History:  Past Medical History:  Diagnosis Date   Dizziness     Past Surgical History:  Procedure Laterality Date   TONSILLECTOMY     Family History:  Family History  Problem Relation Age of Onset   Lung cancer Maternal Grandfather    Family Psychiatric  History: see H&P Social History:  Social History   Substance and Sexual Activity  Alcohol Use No     Social History   Substance and Sexual Activity  Drug Use Not Currently    Types: Marijuana    Social History   Socioeconomic History   Marital status: Married    Spouse name: Not on file   Number of children: Not on file   Years of education: Not on file   Highest education level: Not on file  Occupational History   Not on file  Tobacco Use   Smoking status: Every Day    Packs/day: 0.50    Years: 14.00    Pack years: 7.00    Types: Cigarettes   Smokeless tobacco: Former    Types: Chew   Tobacco comments:    Pt refused counseling  Vaping Use   Vaping Use: Never used  Substance and Sexual Activity   Alcohol use: No   Drug use: Not Currently    Types: Marijuana   Sexual activity: Yes    Comment: pt married  Other Topics Concern   Not on file  Social History Narrative   Not on file   Social Determinants of Health   Financial Resource Strain: Not on file  Food Insecurity: Not on file  Transportation Needs: Not on file  Physical Activity: Not on file  Stress: Not on file  Social Connections: Not on file   Additional Social History:                         Sleep: Fair  Appetite:  Fair  Current Medications: Current Facility-Administered Medications  Medication Dose Route Frequency Provider Last Rate Last Admin   acetaminophen (  TYLENOL) tablet 650 mg  650 mg Oral Q6H PRN Caroline Sauger, NP       alum & mag hydroxide-simeth (MAALOX/MYLANTA) 200-200-20 MG/5ML suspension 30 mL  30 mL Oral Q4H PRN Caroline Sauger, NP       benztropine (COGENTIN) tablet 1 mg  1 mg Oral Daily Patrecia Pour, NP   1 mg at 09/05/21 [id number]   FLUoxetine (PROZAC) capsule 20 mg  20 mg Oral Daily Patrecia Pour, NP   20 mg at 09/05/21 [id number]   gabapentin (NEURONTIN) capsule 100 mg  100 mg Oral TID Patrecia Pour, NP   100 mg at 09/05/21 1222   haloperidol (HALDOL) tablet 2 mg  2 mg Oral BID Patrecia Pour, NP   2 mg at 09/05/21 [id number]   magnesium hydroxide (MILK OF MAGNESIA) suspension 30 mL  30 mL Oral Daily PRN Caroline Sauger, NP        mirtazapine (REMERON) tablet 7.5 mg  7.5 mg Oral QHS Patrecia Pour, NP   7.5 mg at 09/04/21 2156   QUEtiapine (SEROQUEL) tablet 100 mg  100 mg Oral BID Patrecia Pour, NP   100 mg at 09/05/21 [id number]    Lab Results:  Results for orders placed or performed during the hospital encounter of 09/04/21 (from the past 48 hour(s))  Hemoglobin A1c     Status: None   Collection Time: 09/04/21 12:32 PM  Result Value Ref Range   Hgb A1c MFr Bld 5.4 4.8 - 5.6 %    Comment: (NOTE) Pre diabetes:          5.7%-6.4%  Diabetes:              >6.4%  Glycemic control for   <7.0% adults with diabetes    Mean Plasma Glucose 108.28 mg/dL    Comment: Performed at [facility], [place] [address], [place], [place] [address]  Lipid panel     Status: Abnormal   Collection Time: 09/04/21 12:32 PM  Result Value Ref Range   Cholesterol 131 0 - 200 mg/dL   Triglycerides 223 (H) <150 mg/dL   HDL 33 (L) >40 mg/dL   Total CHOL/HDL Ratio 4.0 RATIO   VLDL 45 (H) 0 - 40 mg/dL   LDL Cholesterol 53 0 - 99 mg/dL    Comment:        Total Cholesterol/HDL:CHD Risk Coronary Heart Disease Risk Table                     Men   Women  1/2 Average Risk   3.4   3.3  Average Risk       5.0   4.4  2 X Average Risk   9.6   7.1  3 X Average Risk  23.4   11.0        Use the calculated Patient Ratio above and the CHD Risk Table to determine the patient's CHD Risk.        ATP III CLASSIFICATION (LDL):  <100     mg/dL   Optimal  100-129  mg/dL   Near or Above                    Optimal  130-159  mg/dL   Borderline  160-189  mg/dL   High  >190     mg/dL   Very High Performed at [facility], [address], [place],  [address]     Blood Alcohol level:  Lab Results  Component Value Date   ETH <10 09/03/2021   ETH <10 [id number]    Metabolic Disorder Labs: Lab Results  Component Value Date   HGBA1C 5.4 09/04/2021   MPG 108.28 09/04/2021   MPG 111.15 12/14/2018   No results found for:  PROLACTIN Lab Results  Component Value Date   CHOL 131 09/04/2021   TRIG 223 (H) 09/04/2021   HDL 33 (L) 09/04/2021   CHOLHDL 4.0 09/04/2021   VLDL 45 (H) 09/04/2021   LDLCALC 53 09/04/2021   LDLCALC 24 12/14/2018    Physical Findings: AIMS:  , ,  ,  ,    CIWA:    COWS:     Musculoskeletal: Strength & Muscle Tone: within normal limits Gait & Station: normal Patient leans: N/A  Psychiatric Specialty Exam: Appearance:  AAM, appearing stated age, appears hospital clothes, with fair grooming and hygiene. Normal level of alertness and appropriate facial expression.  Attitude/Behavior: calm, cooperative, engaging with appropriate eye contact.  Motor: WNL; dyskinesias not evident. Gait appears in full range.  Speech: spontaneous, clear, coherent, normal comprehension.  Mood: euthymic, "better".  Affect: appropriately-reactive, restricted.  Thought process: patient appears coherent, organized, logical.  Thought content: patient denies suicidal thoughts, denies homicidal thoughts; did not express any delusions.  Thought perception: patient continues to report auditory and visual hallucinations. Did not appear internally stimulated.  Cognition: patient is alert and oriented in self, place, date.  Insight: fair, in regards of understanding of presence, nature, cause, and significance of mental or emotional problem.  Judgement: fair, in regards of ability to make good decisions concerning the appropriate thing to do in various situations, including ability to form opinions regarding their mental health condition.    Psychomotor Activity  Psychomotor Activity:No data recorded  Assets  Assets:Communication Skills; Desire for Improvement; Financial Resources/Insurance; Housing; Resilience; Social Support   Sleep  Sleep:No data recorded   Physical Exam: Physical Exam ROS Blood pressure 115/87, pulse 80, temperature 98.2 F (36.8 C), temperature source Oral, resp. rate  18, height 5\' 11"  (1.803 m), weight 81.6 kg, SpO2 99 %. Body mass index is 25.09 kg/m.   Treatment Plan Summary: Daily contact with patient to assess and evaluate symptoms and progress in treatment and Medication management  Patient is a 35 year old male with the above-stated past psychiatric history who is seen in follow-up.  Chart reviewed. Patient discussed with nursing. Patient reports improvement of mood and psychotic symptoms since medications were added on admission. No changes in medicines today, except of Mirtazapine dose increase. Patient has an ACT team, he thinks he can stay with mother after discharge - we will confirm that info and start working on D/C planning.     Plan:  -continue inpatient psych admission; 15-minute checks; daily contact with patient to assess and evaluate symptoms and progress in treatment; psychoeducation.  -continue scheduled medications: Major depressive disorder, recurrent, severe with psychosis: -continue Prozac 20 mg daily -Continue Seroquel 100 mg BID -continue haldol 2 mg BID   EPS: -continue cogentin 1 mg daily   Anxiety: -continue gabapentin 100 mg TID   Insomnia: -increase Remeron to 15 mg at bedtime  -continue PRN medications.  acetaminophen, alum & mag hydroxide-simeth, magnesium hydroxide    -Consults: No new consults placed since yesterday    -Disposition: Estimated duration of hospitalization: late weekend - early next week.  All necessary aftercare will be arranged prior to discharge Likely d/c home with outpatient psych follow-up.  -  I certify that the patient  does need, on a daily basis, active treatment furnished directly by or requiring the supervision of inpatient psychiatric facility personnel.    Thalia Party, MD 09/05/2021, 2:44 PM

## 2021-09-05 NOTE — Progress Notes (Signed)
Recreation Therapy Notes   Date: 09/05/2021  Time: 10:15 am    Location:  Craft room    Behavioral response: N/A   Intervention Topic: Communication   Discussion/Intervention: Patient did not attend group.  Clinical Observations/Feedback:  Patient did not attend group.   [employer] LRT/CTRS        Denna Fryberger 09/05/2021 12:11 PM

## 2021-09-05 NOTE — Progress Notes (Signed)
Pt still asleep 

## 2021-09-05 NOTE — Progress Notes (Signed)
Recreation Therapy Notes  INPATIENT RECREATION TR PLAN  Patient Details Name: Joshua Shaffer. MRN: [id number] DOB: [birth year] Today's Date: 09/05/2021  Rec Therapy Plan Is patient appropriate for Therapeutic Recreation?: Yes Treatment times per week: at least 3 Estimated Length of Stay: 5-7 days TR Treatment/Interventions: Group participation (Comment)  Discharge Criteria Pt will be discharged from therapy if:: Discharged Treatment plan/goals/alternatives discussed and agreed upon by:: Patient/family  Discharge Summary     Joshua Shaffer 09/05/2021, 3:00 PM

## 2021-09-05 NOTE — Progress Notes (Signed)
Patient was mostly in bed reporting that he did not get enough sleep last night. No additional concerns.

## 2021-09-05 NOTE — Plan of Care (Signed)
Pt out of bed, had breakfast and received AM medications. Alert and oriented but appears to be preoccupied . Sad and reports that he did not sleep very well. He denies SI/HI. Encouragements and support provided. Safety monitored per unit protocol.

## 2021-09-05 NOTE — BH IP Treatment Plan (Signed)
Interdisciplinary Treatment and Diagnostic Plan Update  09/05/2021 Time of Session: 0900 Joshua Shaffer. MRN: [id number]  Principal Diagnosis: Severe recurrent major depressive disorder with psychotic features ([place])  Secondary Diagnoses: Principal Problem:   Severe recurrent major depressive disorder with psychotic features ([place])   Current Medications:  Current Facility-Administered Medications  Medication Dose Route Frequency Provider Last Rate Last Admin   acetaminophen (TYLENOL) tablet 650 mg  650 mg Oral Q6H PRN Caroline Sauger, NP       alum & mag hydroxide-simeth (MAALOX/MYLANTA) 200-200-20 MG/5ML suspension 30 mL  30 mL Oral Q4H PRN Caroline Sauger, NP       benztropine (COGENTIN) tablet 1 mg  1 mg Oral Daily Patrecia Pour, NP   1 mg at 09/05/21 2229   FLUoxetine (PROZAC) capsule 20 mg  20 mg Oral Daily Patrecia Pour, NP   20 mg at 09/05/21 7989   gabapentin (NEURONTIN) capsule 100 mg  100 mg Oral TID Patrecia Pour, NP   100 mg at 09/05/21 2119   haloperidol (HALDOL) tablet 2 mg  2 mg Oral BID Patrecia Pour, NP   2 mg at 09/05/21 4174   magnesium hydroxide (MILK OF MAGNESIA) suspension 30 mL  30 mL Oral Daily PRN Caroline Sauger, NP       mirtazapine (REMERON) tablet 7.5 mg  7.5 mg Oral QHS Patrecia Pour, NP   7.5 mg at 09/04/21 2156   QUEtiapine (SEROQUEL) tablet 100 mg  100 mg Oral BID Patrecia Pour, NP   100 mg at 09/05/21 0814   PTA Medications: Medications Prior to Admission  Medication Sig Dispense Refill Last Dose   PARoxetine (PAXIL) 20 MG tablet Take 1 tablet (20 mg total) by mouth daily. (Patient not taking: Reported on 09/03/2021) 30 tablet 1    QUEtiapine (SEROQUEL) 100 MG tablet Take 1 tablet (100 mg total) by mouth daily. (Patient not taking: Reported on 09/03/2021) 30 tablet 1     Patient Stressors: Loss of Apartment   Medication change or noncompliance    Patient Strengths: Ability for insight  Motivation for treatment/growth    Treatment Modalities: Medication Management, Group therapy, Case management,  1 to 1 session with clinician, Psychoeducation, Recreational therapy.   Physician Treatment Plan for Primary Diagnosis: Severe recurrent major depressive disorder with psychotic features ([place]) Long Term Goal(s): Improvement in symptoms so as ready for discharge   Short Term Goals: Ability to identify changes in lifestyle to reduce recurrence of condition will improve Ability to verbalize feelings will improve Ability to disclose and discuss suicidal ideas Ability to demonstrate self-control will improve Ability to identify and develop effective coping behaviors will improve Ability to maintain clinical measurements within normal limits will improve Compliance with prescribed medications will improve  Medication Management: Evaluate patient's response, side effects, and tolerance of medication regimen.  Therapeutic Interventions: 1 to 1 sessions, Unit Group sessions and Medication administration.  Evaluation of Outcomes: Not Met  Physician Treatment Plan for Secondary Diagnosis: Principal Problem:   Severe recurrent major depressive disorder with psychotic features ([place])  Long Term Goal(s): Improvement in symptoms so as ready for discharge   Short Term Goals: Ability to identify changes in lifestyle to reduce recurrence of condition will improve Ability to verbalize feelings will improve Ability to disclose and discuss suicidal ideas Ability to demonstrate self-control will improve Ability to identify and develop effective coping behaviors will improve Ability to maintain clinical measurements within normal limits will improve Compliance with prescribed medications  will improve     Medication Management: Evaluate patient's response, side effects, and tolerance of medication regimen.  Therapeutic Interventions: 1 to 1 sessions, Unit Group sessions and Medication administration.  Evaluation of  Outcomes: Not Met   RN Treatment Plan for Primary Diagnosis: Severe recurrent major depressive disorder with psychotic features ([place]) Long Term Goal(s): Knowledge of disease and therapeutic regimen to maintain health will improve  Short Term Goals: Ability to remain free from injury will improve, Ability to verbalize frustration and anger appropriately will improve, Ability to demonstrate self-control, Ability to participate in decision making will improve, Ability to verbalize feelings will improve, Ability to disclose and discuss suicidal ideas, Ability to identify and develop effective coping behaviors will improve, and Compliance with prescribed medications will improve  Medication Management: RN will administer medications as ordered by provider, will assess and evaluate patient's response and provide education to patient for prescribed medication. RN will report any adverse and/or side effects to prescribing provider.  Therapeutic Interventions: 1 on 1 counseling sessions, Psychoeducation, Medication administration, Evaluate responses to treatment, Monitor vital signs and CBGs as ordered, Perform/monitor CIWA, COWS, AIMS and Fall Risk screenings as ordered, Perform wound care treatments as ordered.  Evaluation of Outcomes: Not Met   LCSW Treatment Plan for Primary Diagnosis: Severe recurrent major depressive disorder with psychotic features ([place]) Long Term Goal(s): Safe transition to appropriate next level of care at discharge, Engage patient in therapeutic group addressing interpersonal concerns.  Short Term Goals: Engage patient in aftercare planning with referrals and resources, Increase social support, Increase ability to appropriately verbalize feelings, Increase emotional regulation, Facilitate acceptance of mental health diagnosis and concerns, Facilitate patient progression through stages of change regarding substance use diagnoses and concerns, Identify triggers associated with  mental health/substance abuse issues, and Increase skills for wellness and recovery  Therapeutic Interventions: Assess for all discharge needs, 1 to 1 time with Social worker, Explore available resources and support systems, Assess for adequacy in community support network, Educate family and significant other(s) on suicide prevention, Complete Psychosocial Assessment, Interpersonal group therapy.  Evaluation of Outcomes: Not Met   Progress in Treatment: Attending groups: Yes. Participating in groups: No. Taking medication as prescribed: Yes. Toleration medication: Yes. Family/Significant other contact made: No, will contact:  CSW attempted to contact collateral, left voicemail, will make additional attempts to reach collateral.  Patient understands diagnosis: No. Discussing patient identified problems/goals with staff: Yes. Medical problems stabilized or resolved: Yes. Denies suicidal/homicidal ideation: Yes. Issues/concerns per patient self-inventory: Yes. Other: none   New problem(s) identified: Yes, Describe:  Patient states that he is homeless.   New Short Term/Long Term Goal(s): Patient will work towards elimination of symptoms of psychosis, medication management for mood stabilization; elimination of SI thoughts; development of comprehensive mental wellness plan.  Patient Goals:  "Just to get back on the right meds"   Discharge Plan or Barriers: Patient is homeless, CSW to discuss housing options with patient.   Reason for Continuation of Hospitalization: Delusions  Hallucinations  Estimated Length of Stay: TBD   Scribe for Treatment Team: Larose Kells 09/05/2021 10:22 AM

## 2021-09-05 NOTE — Progress Notes (Signed)
Recreation Therapy Notes  INPATIENT RECREATION THERAPY ASSESSMENT  Patient Details Name: Joshua Shaffer. MRN: [id number] DOB: 1986-04-23 Today's Date: 09/05/2021       Information Obtained From: Patient  Able to Participate in Assessment/Interview: Yes  Patient Presentation: Responsive  Reason for Admission (Per Patient): Active Symptoms  Patient Stressors:    Coping Skills:   Music, Art  Leisure Interests (2+):  Art - Draw, Individual - TV, Music - Listen  Frequency of Recreation/Participation: Monthly  Awareness of Community Resources:  No  Community Resources:     Current Use: No  If no, Barriers?:    Expressed Interest in [employer] Information: Yes  [place]idence[place]  Patient Main Form of Transportation: Car  Patient Strengths:  Unsure  Patient Identified Areas of Improvement:  Medication  Patient Goal for Hospitalization:  To get back on right medication  Current SI (including self-harm):  No  Current HI:  No  Current AVH: Yes  Staff Intervention Plan: Group Attendance, Collaborate with Interdisciplinary Treatment Team  Consent to Intern Participation: N/A  Joshua Shaffer 09/05/2021, 2:59 PM

## 2021-09-05 NOTE — Group Note (Signed)
[facility] LCSW Group Therapy Note ° ° °Group Date: 09/05/2021 °Start Time: 1300 °End Time: 1400 ° ° °Type of Therapy/Topic:  Group Therapy:  Balance in Life ° °Participation Level:  Did Not Attend  ° °Description of Group:   ° This group will address the concept of balance and how it feels and looks when one is unbalanced. Patients will be encouraged to process areas in their lives that are out of balance, and identify reasons for remaining unbalanced. Facilitators will guide patients utilizing problem- solving interventions to address and correct the stressor making their life unbalanced. Understanding and applying boundaries will be explored and addressed for obtaining  and maintaining a balanced life. Patients will be encouraged to explore ways to assertively make their unbalanced needs known to significant others in their lives, using other group members and facilitator for support and feedback. ° °Therapeutic Goals: °Patient will identify two or more emotions or situations they have that consume much of in their lives. °Patient will identify signs/triggers that life has become out of balance:  °Patient will identify two ways to set boundaries in order to achieve balance in their lives:  °Patient will demonstrate ability to communicate their needs through discussion and/or role plays ° °Summary of Patient Progress: °Patient did not attend group despite encouraged participation.  ° ° ° °Therapeutic Modalities:   °Cognitive Behavioral Therapy °Solution-Focused Therapy °Assertiveness Training ° ° °Robertine Kipper W Detavious Rinn, LCSWA °

## 2021-09-06 DIAGNOSIS — F333 Major depressive disorder, recurrent, severe with psychotic symptoms: Secondary | ICD-10-CM | POA: Diagnosis not present

## 2021-09-06 NOTE — Progress Notes (Signed)
[facility] MD Progress Note  09/06/2021 12:57 PM [name].  MRN:  [id number]  Principal Problem: Severe recurrent major depressive disorder with psychotic features ([place]) Diagnosis: Principal Problem:   Severe recurrent major depressive disorder with psychotic features [facility])  Patient is a  35y.o. male with MDD with psychosis vs. Schizoaffective disorder, who presents to the [facility] unit due to severe depression, insomnia and psychosis.   Interval History Patient was seen today for re-evaluation.  Nursing reports no events overnight. The patient has no issues with performing ADLs.  Patient has been medication compliant.    Subjective:  On assessment patient reports "I am good". He reports that his mood seems to be improved - denies feeling depressed. He is less anxious too, says "my heart is not pounding anymore". His sleep is "better". He denies hallucinations today.  He denies suicidal/homicidal ideations. The patient reports no side effects from medications. He is homeless, but thinks he can stay with his mother. He reports having an ACT team [employer].  Labs: no new results for review.  Total Time spent with patient: 30 minutes  Past Psychiatric History: Past history of two admissions at [facility] per with no documentation of schizophrenia. He is followed by [facility].  Past Medical History:  Past Medical History:  Diagnosis Date   Dizziness     Past Surgical History:  Procedure Laterality Date   TONSILLECTOMY     Family History:  Family History  Problem Relation Age of Onset   Lung cancer Maternal Grandfather    Family Psychiatric  History: see H&P Social History:  Social History   Substance and Sexual Activity  Alcohol Use No     Social History   Substance and Sexual Activity  Drug Use Not Currently   Types: Marijuana    Social History   Socioeconomic History   Marital status: Married    Spouse name: Not on file   Number of children: Not on file   Years of  education: Not on file   Highest education level: Not on file  Occupational History   Not on file  Tobacco Use   Smoking status: Every Day    Packs/day: 0.50    Years: 14.00    Pack years: 7.00    Types: Cigarettes   Smokeless tobacco: Former    Types: Chew   Tobacco comments:    Pt refused counseling  Vaping Use   Vaping Use: Never used  Substance and Sexual Activity   Alcohol use: No   Drug use: Not Currently    Types: Marijuana   Sexual activity: Yes    Comment: pt married  Other Topics Concern   Not on file  Social History Narrative   Not on file   Social Determinants of Health   Financial Resource Strain: Not on file  Food Insecurity: Not on file  Transportation Needs: Not on file  Physical Activity: Not on file  Stress: Not on file  Social Connections: Not on file   Additional Social History:                         Sleep: Fair  Appetite:  Fair  Current Medications: Current Facility-Administered Medications  Medication Dose Route Frequency Provider Last Rate Last Admin   acetaminophen (TYLENOL) tablet 650 mg  650 mg Oral Q6H PRN Caroline Sauger, NP       alum & mag hydroxide-simeth (MAALOX/MYLANTA) 200-200-20 MG/5ML suspension 30 mL  30  mL Oral Q4H PRN Gillermo Murdoch, NP       benztropine (COGENTIN) tablet 1 mg  1 mg Oral Daily Charm Rings, NP   1 mg at 09/06/21 2831   FLUoxetine (PROZAC) capsule 20 mg  20 mg Oral Daily Charm Rings, NP   20 mg at 09/06/21 5176   gabapentin (NEURONTIN) capsule 100 mg  100 mg Oral TID Charm Rings, NP   100 mg at 09/06/21 1218   haloperidol (HALDOL) tablet 2 mg  2 mg Oral BID Charm Rings, NP   2 mg at 09/06/21 1607   magnesium hydroxide (MILK OF MAGNESIA) suspension 30 mL  30 mL Oral Daily PRN Gillermo Murdoch, NP       mirtazapine (REMERON) tablet 15 mg  15 mg Oral QHS Thalia Party, MD   15 mg at 09/05/21 2140   QUEtiapine (SEROQUEL) tablet 100 mg  100 mg Oral BID Charm Rings, NP    100 mg at 09/06/21 3710    Lab Results:  No results found for this or any previous visit (from the past 48 hour(s)).   Blood Alcohol level:  Lab Results  Component Value Date   ETH <10 09/03/2021   ETH <10 08/20/2021    Metabolic Disorder Labs: Lab Results  Component Value Date   HGBA1C 5.4 09/04/2021   MPG 108.28 09/04/2021   MPG 111.15 12/14/2018   No results found for: PROLACTIN Lab Results  Component Value Date   CHOL 131 09/04/2021   TRIG 223 (H) 09/04/2021   HDL 33 (L) 09/04/2021   CHOLHDL 4.0 09/04/2021   VLDL 45 (H) 09/04/2021   LDLCALC 53 09/04/2021   LDLCALC 24 12/14/2018    Physical Findings: AIMS:  , ,  ,  ,    CIWA:    COWS:     Musculoskeletal: Strength & Muscle Tone: within normal limits Gait & Station: normal Patient leans: N/A  Psychiatric Specialty Exam: Appearance:  AAM, appearing stated age, appears hospital clothes, with fair grooming and hygiene. Normal level of alertness and appropriate facial expression.  Attitude/Behavior: calm, cooperative, engaging with appropriate eye contact.  Motor: WNL; dyskinesias not evident. Gait appears in full range.  Speech: spontaneous, clear, coherent, normal comprehension.  Mood: euthymic, "better".  Affect: appropriately-reactive, restricted.  Thought process: patient appears coherent, organized, logical.  Thought content: patient denies suicidal thoughts, denies homicidal thoughts; did not express any delusions.  Thought perception: patient continues to report auditory and visual hallucinations. Did not appear internally stimulated.  Cognition: patient is alert and oriented in self, place, date.  Insight: fair, in regards of understanding of presence, nature, cause, and significance of mental or emotional problem.  Judgement: fair, in regards of ability to make good decisions concerning the appropriate thing to do in various situations, including ability to form opinions regarding their mental  health condition.    Psychomotor Activity  Psychomotor Activity:No data recorded  Assets  Assets:Communication Skills; Desire for Improvement; Financial Resources/Insurance; Housing; Resilience; Social Support   Sleep  Sleep:No data recorded   Physical Exam: Physical Exam ROS Blood pressure 107/77, pulse 76, temperature 98.2 F (36.8 C), temperature source Oral, resp. rate 18, height 5\' 11"  (1.803 m), weight 81.6 kg, SpO2 98 %. Body mass index is 25.09 kg/m.   Treatment Plan Summary: Daily contact with patient to assess and evaluate symptoms and progress in treatment and Medication management  Patient is a 35 year old male with the above-stated past psychiatric history who is seen in  follow-up.  Chart reviewed. Patient discussed with nursing. Patient reports improvement of mood and psychotic symptoms since medications were added on admission. No changes in medicines today. Patient has an ACT team, he thinks he can stay with mother after discharge - we will confirm that info and start working on D/C planning.     Plan:  -continue inpatient psych admission; 15-minute checks; daily contact with patient to assess and evaluate symptoms and progress in treatment; psychoeducation.  -continue scheduled medications: Major depressive disorder, recurrent, severe with psychosis: -continue Prozac 20 mg daily -Continue Seroquel 100 mg BID -continue haldol 2 mg BID   EPS: -continue cogentin 1 mg daily   Anxiety: -continue gabapentin 100 mg TID   Insomnia: -continue Remeron 15 mg at bedtime  -continue PRN medications.  acetaminophen, alum & mag hydroxide-simeth, magnesium hydroxide    -Consults: No new consults placed since yesterday    -Disposition: Estimated duration of hospitalization: late weekend - early next week.  All necessary aftercare will be arranged prior to discharge Likely d/c home with outpatient psych follow-up.  -  I certify that the patient does need, on a  daily basis, active treatment furnished directly by or requiring the supervision of inpatient psychiatric facility personnel.    Larita Fife, MD 09/06/2021, 12:57 PM

## 2021-09-06 NOTE — Progress Notes (Signed)
Patient denies pain, anxiety and depression. Patient reports not being able to sleep very well but the anxiety he felt was keeping him up is no longer there. Patient denies SI/HI/AVH. Patient has been pleasant and cooperative. Patient compliant with medication administration. Patient isolative to room with the exception of coming out for meals and occasionally to make a phone call.  Q15 minute safety checks maintained. Patient remains safe on the unit at this time.

## 2021-09-06 NOTE — Progress Notes (Signed)
Patient alert and oriented x 4 affect is flat, he isolates to self, he denies SI/HI/AVH no distress 15 minutes safety checks maintained will continue to monitor.

## 2021-09-06 NOTE — Progress Notes (Signed)
Patient calm and pleasant during assessment. Patient denies SI/HI/AVH. Patient endorses anxiety. Pt observed by this Clinical research associate interacting appropriately with staff and peers on the unit. Patient compliant with medication administration per MD orders. Pt given education, support, and encouragement to be active in his treatment plan. Pt being monitored Q 15 minutes for safety per unit protocol. Pt remains safe on the unit.

## 2021-09-06 NOTE — Group Note (Signed)
LCSW Group Therapy Note   Group Date: 09/06/2021 Start Time: 1330 End Time: 1430   Type of Therapy and Topic:  Group Therapy: Boundaries  Participation Level:  Did Not Attend  Description of Group: This group will address the use of boundaries in their personal lives. Patients will explore why boundaries are important, the difference between healthy and unhealthy boundaries, and negative and postive outcomes of different boundaries and will look at how boundaries can be crossed.  Patients will be encouraged to identify current boundaries in their own lives and identify what kind of boundary is being set. Facilitators will guide patients in utilizing problem-solving interventions to address and correct types boundaries being used and to address when no boundary is being used. Understanding and applying boundaries will be explored and addressed for obtaining and maintaining a balanced life. Patients will be encouraged to explore ways to assertively make their boundaries and needs known to significant others in their lives, using other group members and facilitator for role play, support, and feedback.  Therapeutic Goals:  1.  Patient will identify areas in their life where setting clear boundaries could be  used to improve their life.  2.  Patient will identify signs/triggers that a boundary is not being respected. 3.  Patient will identify two ways to set boundaries in order to achieve balance in  their lives: 4.  Patient will demonstrate ability to communicate their needs and set boundaries  through discussion and/or role plays  Summary of Patient Progress:   X  Therapeutic Modalities:   Cognitive Behavioral Therapy Solution-Focused Therapy  Harden Mo, LCSWA 09/06/2021  3:28 PM

## 2021-09-07 DIAGNOSIS — F333 Major depressive disorder, recurrent, severe with psychotic symptoms: Secondary | ICD-10-CM | POA: Diagnosis not present

## 2021-09-07 MED ORDER — HALOPERIDOL 5 MG PO TABS
5.0000 mg | ORAL_TABLET | Freq: Two times a day (BID) | ORAL | Status: DC
  Administered 2021-09-07 – 2021-09-08 (×2): 5 mg via ORAL
  Filled 2021-09-07 (×2): qty 1

## 2021-09-07 NOTE — Progress Notes (Signed)
[facility] MD Progress Note  09/07/2021 9:16 AM Joshua Shaffer.  MRN:  [id number]  Principal Problem: Severe recurrent major depressive disorder with psychotic features ([place]) Diagnosis: Principal Problem:   Severe recurrent major depressive disorder with psychotic features [facility])  Patient is a  35y.o. male with MDD with psychosis vs. Schizoaffective disorder, who presents to the [facility] unit due to severe depression, insomnia and psychosis.   Interval History Patient was seen today for re-evaluation.  Nursing reports no events overnight. The patient has no issues with performing ADLs.  Patient has been medication compliant.    Subjective:  On assessment patient reports "I am okay". He reports that his mood seems to be improved - denies feeling depressed. He is still anxious and reports that he is experiencing auditory "voices" still. Denies commanding hallucinations, reports they improved compare to the presentation on admission, although are still there. He is agreeable to antipsychotic dose increase. He denies suicidal/homicidal ideations. The patient reports no side effects from medications. He is homeless, but thinks he can stay with his mother. He reports having an ACT team [facility]. The unit SW will  try to confirm this information.  Labs: no new results for review.  Total Time spent with patient: 30 minutes  Past Psychiatric History: Past history of two admissions at [facility] per with no documentation of schizophrenia. He is followed by [facility]up.  Past Medical History:  Past Medical History:  Diagnosis Date   Dizziness     Past Surgical History:  Procedure Laterality Date   TONSILLECTOMY     Family History:  Family History  Problem Relation Age of Onset   Lung cancer Maternal Grandfather    Family Psychiatric  History: see H&P Social History:  Social History   Substance and Sexual Activity  Alcohol Use No     Social History   Substance and Sexual Activity  Drug Use Not  Currently   Types: Marijuana    Social History   Socioeconomic History   Marital status: Married    Spouse name: Not on file   Number of children: Not on file   Years of education: Not on file   Highest education level: Not on file  Occupational History   Not on file  Tobacco Use   Smoking status: Every Day    Packs/day: 0.50    Years: 14.00    Pack years: 7.00    Types: Cigarettes   Smokeless tobacco: Former    Types: Chew   Tobacco comments:    Pt refused counseling  Vaping Use   Vaping Use: Never used  Substance and Sexual Activity   Alcohol use: No   Drug use: Not Currently    Types: Marijuana   Sexual activity: Yes    Comment: pt married  Other Topics Concern   Not on file  Social History Narrative   Not on file   Social Determinants of Health   Financial Resource Strain: Not on file  Food Insecurity: Not on file  Transportation Needs: Not on file  Physical Activity: Not on file  Stress: Not on file  Social Connections: Not on file   Additional Social History:                         Sleep: Fair  Appetite:  Fair  Current Medications: Current Facility-Administered Medications  Medication Dose Route Frequency Provider Last Rate Last Admin   acetaminophen (TYLENOL) tablet 650 mg  650  mg Oral Q6H PRN Gillermo Murdoch, NP       alum & mag hydroxide-simeth (MAALOX/MYLANTA) 200-200-20 MG/5ML suspension 30 mL  30 mL Oral Q4H PRN Gillermo Murdoch, NP       benztropine (COGENTIN) tablet 1 mg  1 mg Oral Daily Charm Rings, NP   1 mg at 09/07/21 0827   FLUoxetine (PROZAC) capsule 20 mg  20 mg Oral Daily Charm Rings, NP   20 mg at 09/07/21 1610   gabapentin (NEURONTIN) capsule 100 mg  100 mg Oral TID Charm Rings, NP   100 mg at 09/07/21 9604   haloperidol (HALDOL) tablet 5 mg  5 mg Oral BID Thalia Party, MD       magnesium hydroxide (MILK OF MAGNESIA) suspension 30 mL  30 mL Oral Daily PRN Gillermo Murdoch, NP       mirtazapine  (REMERON) tablet 15 mg  15 mg Oral QHS Thalia Party, MD   15 mg at 09/06/21 2051   QUEtiapine (SEROQUEL) tablet 100 mg  100 mg Oral BID Charm Rings, NP   100 mg at 09/07/21 5409    Lab Results:  No results found for this or any previous visit (from the past 48 hour(s)).   Blood Alcohol level:  Lab Results  Component Value Date   ETH <10 09/03/2021   ETH <10 08/20/2021    Metabolic Disorder Labs: Lab Results  Component Value Date   HGBA1C 5.4 09/04/2021   MPG 108.28 09/04/2021   MPG 111.15 12/14/2018   No results found for: PROLACTIN Lab Results  Component Value Date   CHOL 131 09/04/2021   TRIG 223 (H) 09/04/2021   HDL 33 (L) 09/04/2021   CHOLHDL 4.0 09/04/2021   VLDL 45 (H) 09/04/2021   LDLCALC 53 09/04/2021   LDLCALC 24 12/14/2018    Physical Findings: AIMS:  , ,  ,  ,    CIWA:    COWS:     Musculoskeletal: Strength & Muscle Tone: within normal limits Gait & Station: normal Patient leans: N/A  Psychiatric Specialty Exam: Appearance:  AAM, appearing stated age, appears hospital clothes, with fair grooming and hygiene. Normal level of alertness and appropriate facial expression.  Attitude/Behavior: calm, cooperative, engaging with appropriate eye contact.  Motor: WNL; dyskinesias not evident. Gait appears in full range.  Speech: spontaneous, clear, coherent, normal comprehension.  Mood: euthymic, "better".  Affect: appropriately-reactive, restricted.  Thought process: patient appears coherent, organized, logical.  Thought content: patient denies suicidal thoughts, denies homicidal thoughts; did not express any delusions.  Thought perception: patient continues to report auditory hallucinations. Did not appear internally stimulated.  Cognition: patient is alert and oriented in self, place, date.  Insight: fair, in regards of understanding of presence, nature, cause, and significance of mental or emotional problem.  Judgement: fair, in regards of  ability to make good decisions concerning the appropriate thing to do in various situations, including ability to form opinions regarding their mental health condition.    Psychomotor Activity  Psychomotor Activity:No data recorded  Assets  Assets:Communication Skills; Desire for Improvement; Financial Resources/Insurance; Housing; Resilience; Social Support   Sleep  Sleep:No data recorded   Physical Exam: Physical Exam ROS Blood pressure 115/90, pulse 72, temperature 98 F (36.7 C), temperature source Oral, resp. rate 17, height 5\' 11"  (1.803 m), weight 81.6 kg, SpO2 96 %. Body mass index is 25.09 kg/m.   Treatment Plan Summary: Daily contact with patient to assess and evaluate symptoms and progress in treatment  and Medication management  Patient is a 36 year old male with the above-stated past psychiatric history who is seen in follow-up.  Chart reviewed. Patient discussed with nursing. Patient reports improvement of mood and psychotic symptoms since medications were added on admission. Today will increase the dose of Haldol for remaining auditory hallucinations. Patient has an ACT team, he thinks he can stay with mother after discharge - we will confirm that info and start working on D/C planning.     Plan:  -continue inpatient psych admission; 15-minute checks; daily contact with patient to assess and evaluate symptoms and progress in treatment; psychoeducation.  -continue scheduled medications: Major depressive disorder  -continue Prozac 20 mg daily  Psychosis: -Continue Seroquel 100 mg BID -increase Haldol to 5 mg BID   EPS: -continue cogentin 1 mg daily   Anxiety: -continue gabapentin 100 mg TID   Insomnia: -continue Remeron 15 mg at bedtime  -continue PRN medications.  acetaminophen, alum & mag hydroxide-simeth, magnesium hydroxide    -Consults: No new consults placed since yesterday    -Disposition: Estimated duration of hospitalization: late weekend -  early next week.  All necessary aftercare will be arranged prior to discharge Likely d/c home with outpatient psych follow-up.  -  I certify that the patient does need, on a daily basis, active treatment furnished directly by or requiring the supervision of inpatient psychiatric facility personnel.    Larita Fife, MD 09/07/2021, 9:16 AM

## 2021-09-07 NOTE — Progress Notes (Signed)
This CSW received consent to speak with patient's mother, Ricco Dershem [phone]). CSW placed call to Taelor Moncada to inquire about patient's ability to live with Kenney Houseman after discharging from the hospital. No answer. Left HIPAA compliant voicemail requesting return call.    Per patient, patient was evicted from his apartment 08/11/21 and is temporarily residing with his mother. Patient reports his wife is residing with her parents until patient and his wife can secure additional housing.   Albertine Patricia, MSW, [place], Bridget Hartshorn 09/07/2021 12:35PM

## 2021-09-07 NOTE — BHH Suicide Risk Assessment (Signed)
BHH INPATIENT:  Family/Significant Other Suicide Prevention Education  Suicide Prevention Education:  Contact Attempts: Tia Hagarty/wife [phone]), has been identified by the patient as the family member/significant other with whom the patient will be residing, and identified as the person(s) who will aid the patient in the event of a mental health crisis.  With written consent from the patient, two attempts were made to provide suicide prevention education, prior to and/or following the patient's discharge.  We were unsuccessful in providing suicide prevention education.  A suicide education pamphlet was given to the patient to share with family/significant other.  Date and time of first attempt: 09/04/21 at 15:35 Date and time of second attempt: 09/07/2021 12:31PM  CSW placed call to [employer]. No Answer. Left HIPAA compliant voicemail requesting return call.   Ileana Ladd Leveta Wahab 09/07/2021, 12:29 PM

## 2021-09-07 NOTE — Group Note (Signed)
LCSW Group Therapy Note  Group Date: 09/07/2021 Start Time: [id number] End Time: 1510   Type of Therapy and Topic:  Group Therapy - How To Cope with Nervousness about Discharge   Participation Level:  Did Not Attend   Description of Group This process group involved identification of patients' feelings about discharge. Some of them are scheduled to be discharged soon, while others are new admissions, but each of them was asked to share thoughts and feelings surrounding discharge from the hospital. One common theme was that they are excited at the prospect of going home, while another was that many of them are apprehensive about sharing why they were hospitalized. Patients were given the opportunity to discuss these feelings with their peers in preparation for discharge.  Therapeutic Goals  Patient will identify their overall feelings about pending discharge. Patient will think about how they might proactively address issues that they believe will once again arise once they get home (i.e. with parents). Patients will participate in discussion about having hope for change.   Summary of Patient Progress: Patient did not attend group despite encouraged participation.    Therapeutic Modalities Cognitive Behavioral Therapy   Sherilyn Dacosta 09/07/2021  5:35 PM

## 2021-09-07 NOTE — Progress Notes (Signed)
D: Pt alert and oriented. Pt rates depression 0/10, hopelessness 0/10, and anxiety 0/10. Pt goal: "Getting ready to go home." Pt reports energy level as normal and concentration as being good. Pt reports sleep last night as being good. Pt did receive medications for sleep and did find them helpful. Pt denies experiencing any pain at this time. Pt denies experiencing any SI/HI, or AVH at this time.   A: Scheduled medications administered to pt, per MD orders. Support and encouragement provided. Frequent verbal contact made. Routine safety checks conducted q15 minutes.   R: No adverse drug reactions noted. Pt verbally contracts for safety at this time. Pt complaint with medications and treatment plan. Pt interacts well with others on the unit. Pt remains safe at this time. Will continue to monitor.

## 2021-09-08 DIAGNOSIS — F333 Major depressive disorder, recurrent, severe with psychotic symptoms: Secondary | ICD-10-CM | POA: Diagnosis not present

## 2021-09-08 MED ORDER — MIRTAZAPINE 15 MG PO TABS: 15.0000 mg | ORAL_TABLET | Freq: Every day | ORAL | 0 refills | Status: DC

## 2021-09-08 MED ORDER — FLUOXETINE HCL 20 MG PO CAPS: 20.0000 mg | ORAL_CAPSULE | Freq: Every day | ORAL | 0 refills | Status: DC

## 2021-09-08 MED ORDER — GABAPENTIN 100 MG PO CAPS: 100.0000 mg | ORAL_CAPSULE | Freq: Three times a day (TID) | ORAL | 0 refills | Status: DC

## 2021-09-08 MED ORDER — QUETIAPINE FUMARATE 100 MG PO TABS: 100.0000 mg | ORAL_TABLET | Freq: Two times a day (BID) | ORAL | 0 refills | Status: DC

## 2021-09-08 MED ORDER — BENZTROPINE MESYLATE 1 MG PO TABS: 1.0000 mg | ORAL_TABLET | Freq: Every day | ORAL | 0 refills | Status: DC

## 2021-09-08 MED ORDER — HALOPERIDOL 5 MG PO TABS: 5.0000 mg | ORAL_TABLET | Freq: Two times a day (BID) | ORAL | 0 refills | Status: DC

## 2021-09-08 NOTE — Plan of Care (Signed)
°  Problem: Education: Goal: Knowledge of [facility] Education information/materials will improve Outcome: Progressing Goal: Emotional status will improve Outcome: Progressing Goal: Mental status will improve Outcome: Progressing Goal: Verbalization of understanding the information provided will improve Outcome: Progressing   Problem: Safety: Goal: Periods of time without injury will increase Outcome: Progressing   Problem: Education: Goal: Will be free of psychotic symptoms Outcome: Progressing Goal: Knowledge of the prescribed therapeutic regimen will improve Outcome: Progressing

## 2021-09-08 NOTE — BHH Suicide Risk Assessment (Signed)
[facility] Discharge Suicide Risk Assessment   Principal Problem: Severe recurrent major depressive disorder with psychotic features [facility]) Discharge Diagnoses: Principal Problem:   Severe recurrent major depressive disorder with psychotic features ([facility])   Total Time spent with patient: 45 minutes  Mental Status Per Nursing Assessment::   On Admission:  Self-harm thoughts  Demographic Factors:  Male and Unemployed  Loss Factors: NA  Historical Factors: NA  Risk Reduction Factors:   Sense of responsibility to family, Living with another person, especially a relative, Positive social support, Positive therapeutic relationship, and Positive coping skills or problem solving skills  Continued Clinical Symptoms:  Bipolar Disorder:   Mixed State  Cognitive Features That Contribute To Risk:  None    Suicide Risk:  Mild:  Suicidal ideation of limited frequency, intensity, duration, and specificity.  There are no identifiable plans, no associated intent, mild dysphoria and related symptoms, good self-control (both objective and subjective assessment), few other risk factors, and identifiable protective factors, including available and accessible social support.  [place] Suicide Severity Rating Scale  Wish to be dead: No  Suicidal thoughts: No                  Suicidal thoughts with method: No                  Suicidal intent: No                  Suicide intent with specific plan: No                  Suicide behavior: No    Plan Of Care/Follow-up recommendations:   Follow-up Information     [address] [facility] [place] [employer]. Follow up.   Why: Your appointment is scheduled for Thursday, 09/11/21 at 11am. This appointment will be at the office. Thanks! Contact information: [address] [place] [address] [phone]                 Emergency Contact Plan: Patient understands to call Suicide Hotline at 19, or Mobile Crisis at [phone], call  911, or go to the nearest ER as appropriate in case of thoughts of harm to self or others, or acute onset of intolerable side effects.    Thalia Party, MD 09/08/2021, 1:10 PM

## 2021-09-08 NOTE — Group Note (Signed)
[facility] LCSW Group Therapy Note    Group Date: 09/08/2021 Start Time: 1300 End Time: 1400  Type of Therapy and Topic:  Group Therapy:  Overcoming Obstacles  Participation Level:  BHH PARTICIPATION LEVEL: Did Not Attend  Mood:  Description of Group:   In this group patients will be encouraged to explore what they see as obstacles to their own wellness and recovery. They will be guided to discuss their thoughts, feelings, and behaviors related to these obstacles. The group will process together ways to cope with barriers, with attention given to specific choices patients can make. Each patient will be challenged to identify changes they are motivated to make in order to overcome their obstacles. This group will be process-oriented, with patients participating in exploration of their own experiences as well as giving and receiving support and challenge from other group members.  Therapeutic Goals: 1. Patient will identify personal and current obstacles as they relate to admission. 2. Patient will identify barriers that currently interfere with their wellness or overcoming obstacles.  3. Patient will identify feelings, thought process and behaviors related to these barriers. 4. Patient will identify two changes they are willing to make to overcome these obstacles:    Summary of Patient Progress X   Therapeutic Modalities:   Cognitive Behavioral Therapy Solution Focused Therapy Motivational Interviewing Relapse Prevention Therapy   Shirl Harris, LCSW

## 2021-09-08 NOTE — Progress Notes (Signed)
°  [facility] Adult Case Management Discharge Plan :  Will you be returning to the same living situation after discharge:  Yes,  pt plans to return to mother's. At discharge, do you have transportation home?: Yes,  pt wife to provide transportation. Do you have the ability to pay for your medications: No.  Release of information consent forms completed and in the chart;  Patient's signature needed at discharge.  Patient to Follow up at:  Follow-up [facility]. Follow up.   Why: Your appointment is scheduled for Thursday, 09/11/21 at 11am. This appointment will be at the office. Thanks! Contact information: [place] [address] [phone]                 Next level of care provider has access to [place] and Suicide Prevention discussed: Yes,  SPE completed with Tia Hackel/spouse.     Has patient been referred to the Quitline?: Patient refused referral  Patient has been referred for addiction treatment: N/A  Shirl Harris, LCSW 09/08/2021, 2:06 PM

## 2021-09-08 NOTE — BHH Suicide Risk Assessment (Signed)
BHH INPATIENT:  Family/Significant Other Suicide Prevention Education  Suicide Prevention Education:  Education Completed; Nels Munn,  (Spouse) has been identified by the patient as the family member/significant other with whom the patient will be residing, and identified as the person(s) who will aid the patient in the event of a mental health crisis (suicidal ideations/suicide attempt).  With written consent from the patient, the family member/significant other has been provided the following suicide prevention education, prior to the and/or following the discharge of the patient.  The suicide prevention education provided includes the following: Suicide risk factors Suicide prevention and interventions National Suicide Hotline telephone number [facility] assessment telephone number [facility] Emergency Assistance 911 [facility] and/or Residential Mobile Crisis Unit telephone number  Request made of family/significant other to: Remove weapons (e.g., guns, rifles, knives), all items previously/currently identified as safety concern.   Remove drugs/medications (over-the-counter, prescriptions, illicit drugs), all items previously/currently identified as a safety concern.  The family member/significant other verbalizes understanding of the suicide prevention education information provided.  The family member/significant other agrees to remove the items of safety concern listed above.  Corky Crafts 09/08/2021, 9:29 AM

## 2021-09-08 NOTE — Progress Notes (Signed)
Patient is pleasant and cooperative. Reports having a good day. Reports being able to go outside and play basketball in the courtyard. Reported "being outside really helped my mental, after being cooped up in here" Was med compliant and received meds without incident. Denies si  hi vh depression and anxiety at this encounter.  Continues to endorse ah, but reports voices are non threatening and "not really saying anything at." Encouraged him to seek staff with any concerns.  Will contiue to monitor with Q 15 minute safety rounds.   C Butler-Nicholson, LPN

## 2021-09-08 NOTE — Discharge Summary (Signed)
Physician Discharge Summary Note  Patient:  Joshua Shaffer. is an 36 y.o., male MRN:  [id number] DOB:  [birth year] Patient phone:  [phone] (home)  Patient address:   [address] [address],  Total Time spent with patient: 45 minutes  Date of Admission:  09/04/2021 Date of Discharge: 09/08/21  Reason for Admission:  35y.o. male with MDD with psychosis vs. Schizoaffective disorder, who presents to the [facility] unit due to severe depression, insomnia and psychosis.  Principal Problem: Severe recurrent major depressive disorder with psychotic features [facility]) Discharge Diagnoses: Principal Problem:   Severe recurrent major depressive disorder with psychotic features [facility])   Past Psychiatric History: Past history of two admissions at [facility] per with no documentation of schizophrenia. He is followed by Armen Pickup.  Past Medical History:  Past Medical History:  Diagnosis Date   Dizziness     Past Surgical History:  Procedure Laterality Date   TONSILLECTOMY     Family History:  Family History  Problem Relation Age of Onset   Lung cancer Maternal Grandfather    Family Psychiatric  History: unknown Social History:  Social History   Substance and Sexual Activity  Alcohol Use No     Social History   Substance and Sexual Activity  Drug Use Not Currently   Types: Marijuana    Social History   Socioeconomic History   Marital status: Married    Spouse name: Not on file   Number of children: Not on file   Years of education: Not on file   Highest education level: Not on file  Occupational History   Not on file  Tobacco Use   Smoking status: Every Day    Packs/day: 0.50    Years: 14.00    Pack years: 7.00    Types: Cigarettes   Smokeless tobacco: Former    Types: Chew   Tobacco comments:    Pt refused counseling  Vaping Use   Vaping Use: Never used  Substance and Sexual Activity   Alcohol use: No   Drug use: Not Currently    Types: Marijuana    Sexual activity: Yes    Comment: pt married  Other Topics Concern   Not on file  Social History Narrative   Not on file   Social Determinants of Health   Financial Resource Strain: Not on file  Food Insecurity: Not on file  Transportation Needs: Not on file  Physical Activity: Not on file  Stress: Not on file  Social Connections: Not on file    Hospital Course:    The patient was admitted to Adult Psychiatry on a voluntary basis. He was restricted to ward and placed on suicide precautions. Patient was introduced to milieu activities and encouraged to participate in psycho-social groups. The plan at the time of admission was safety, stabilization and treatment.  For the management of depressive disorder, patient was started on Fluoxetine at 20 mg p.o. daily and second medication for depression, Mirtazapine was strarted at 7.5mg , then increased to 15 mg p.o. nightly to assist as well with his insomnia as well as his decreased appetite. We increased thye dose of home Seroquel to 100mg  twice daily and started the patient on Haldol 2mg  twice daily initially, then increased to 5mg  twice daily for the management of psychotic symptoms. Patient never required as needed medications for agitation or psychosis. He never required seclusion or restraints. His initial symptoms of depression and psychosis had improved significantly during the course of hospitalization.  He reports feeling much better, reports being in good mood, denies any thoughts of harming self, denies thoughts of harming others. Denies any physical complaints. He reports he can contract for safety if discharged home. Suicide Prevention Education provided to patient`s spouse. Patient has an outpatient ACT Team who will visit him tomorrow.    Physical Exam: Physical Exam ROS Blood pressure 110/83, pulse 66, temperature 98.3 F (36.8 C), temperature source Oral, resp. rate 18, height 5\' 11"  (1.803 m), weight 81.6 kg, SpO2 99 %. Body  mass index is 25.09 kg/m.   Social History   Tobacco Use  Smoking Status Every Day   Packs/day: 0.50   Years: 14.00   Pack years: 7.00   Types: Cigarettes  Smokeless Tobacco Former   Types: Chew  Tobacco Comments   Pt refused counseling   Tobacco Cessation:  N/A, patient does not currently use tobacco products   Blood Alcohol level:  Lab Results  Component Value Date   ETH <10 09/03/2021   ETH <10 [id number]    Metabolic Disorder Labs:  Lab Results  Component Value Date   HGBA1C 5.4 09/04/2021   MPG 108.28 09/04/2021   MPG 111.15 12/14/2018   No results found for: PROLACTIN Lab Results  Component Value Date   CHOL 131 09/04/2021   TRIG 223 (H) 09/04/2021   HDL 33 (L) 09/04/2021   CHOLHDL 4.0 09/04/2021   VLDL 45 (H) 09/04/2021   LDLCALC 53 09/04/2021   LDLCALC 24 12/14/2018    See Psychiatric Specialty Exam and Suicide Risk Assessment completed by Attending Physician prior to discharge.  Discharge destination:  Home  Is patient on multiple antipsychotic therapies at discharge:  Yes,   Do you recommend tapering to monotherapy for antipsychotics?  Yes    Recommended Plan for Multiple Antipsychotic Therapies: NA   Allergies as of 09/08/2021   No Known Allergies      Medication List     STOP taking these medications    PARoxetine 20 MG tablet Commonly known as: PAXIL       TAKE these medications      Indication  benztropine 1 MG tablet Commonly known as: COGENTIN Take 1 tablet (1 mg total) by mouth daily. Start taking on: September 09, 2021  Indication: Extrapyramidal Reaction caused by Medications   FLUoxetine 20 MG capsule Commonly known as: PROZAC Take 1 capsule (20 mg total) by mouth daily. Start taking on: September 09, 2021  Indication: Depression   gabapentin 100 MG capsule Commonly known as: NEURONTIN Take 1 capsule (100 mg total) by mouth 3 (three) times daily.  Indication: Social Anxiety Disorder   haloperidol 5 MG  tablet Commonly known as: HALDOL Take 1 tablet (5 mg total) by mouth 2 (two) times daily.  Indication: Psychosis   mirtazapine 15 MG tablet Commonly known as: REMERON Take 1 tablet (15 mg total) by mouth at bedtime.  Indication: Major Depressive Disorder   QUEtiapine 100 MG tablet Commonly known as: SEROQUEL Take 1 tablet (100 mg total) by mouth 2 (two) times daily. What changed: when to take this  Indication: Depressive Phase of Manic-Depression       Follow-up recommendations:    Follow-up [place] [facility]. Follow up.   Why: Your appointment is scheduled for Thursday, 09/11/21 at 11am. This appointment will be at the office. Thanks! Contact information: [address] [place] [address] [phone]  Comments:  Emergency Contact Plan: Patient understands to call Suicide Hotline at 27, or Mobile Crisis at [phone], call 911, or go to the nearest ER as appropriate in case of thoughts of harm to self or others, or acute onset of intolerable side effects.   Signed: Larita Fife, MD 09/08/2021, 12:41 PM

## 2021-09-08 NOTE — Progress Notes (Signed)
Recreation Therapy Notes  INPATIENT RECREATION TR PLAN  Patient Details Name: Joshua Shaffer. MRN: [id number] DOB: 05-Jun-1986 Today's Date: 09/08/2021  Rec Therapy Plan Is patient appropriate for Therapeutic Recreation?: Yes Treatment times per week: at least 3 Estimated Length of Stay: 5-7 days TR Treatment/Interventions: Group participation (Comment)  Discharge Criteria Pt will be discharged from therapy if:: Discharged Treatment plan/goals/alternatives discussed and agreed upon by:: Patient/family  Discharge Summary Short term goals set: Patient will engage in groups without prompting or encouragement from LRT x3 group sessions within 5 recreation therapy group sessions Short term goals met: Not met Reason goals not met: Patient did not attend any groups. Therapeutic equipment acquired: N/A Reason patient discharged from therapy: Discharge from hospital Pt/family agrees with progress & goals achieved: Yes Date patient discharged from therapy: 09/08/21   Izella Ybanez 09/08/2021, 2:52 PM

## 2021-09-08 NOTE — Progress Notes (Signed)
D: Pt alert and oriented. Pt rates depression 0/10, hopelessness 0/10, and anxiety 1/10. Pt goal: "Getting out of here." Pt reports energy level as normal and concentration as being good. Pt reports sleep last night as being good. Pt did receive medications for sleep and did find them helpful. Pt denies experiencing any pain at this time. Pt denies experiencing any SI/HI, or AVH at this time.   A: Scheduled medications administered to pt, per MD orders. Support and encouragement provided. Frequent verbal contact made. Routine safety checks conducted q15 minutes.   R: No adverse drug reactions noted. Pt verbally contracts for safety at this time. Pt complaint with medications and treatment plan. Pt interacts well with others on the unit. Pt remains safe at this time. Will continue to monitor.

## 2021-09-08 NOTE — Progress Notes (Signed)
Recreation Therapy Notes   Date: 09/08/2021  Time: 10:40 am    Location: Craft room    Behavioral response: N/A   Intervention Topic: Necessities    Discussion/Intervention: Patient did not attend group.  Clinical Observations/Feedback:  Patient did not attend group.   Kevontae Burgoon LRT/CTRS         Chenoa Luddy 09/08/2021 3:00 PM

## 2021-09-08 NOTE — Progress Notes (Signed)
D: Pt alert and oriented. Pt denies experiencing any pain, SI/HI, or AVH at this time. Pt reports he will be able to keep himself safe when he returns home.   A: Pt received discharge and medication education/information. Pt belongings were returned and signed for at this time.   R: Pt verbalized understanding of discharge and medication education/information.  Pt escorted by staff to the medical mall front lobby where pt was picked up by family.

## 2021-09-08 NOTE — BHH Counselor (Signed)
CSW spoke with Kenney Houseman Tuckey/mother [phone]). Mother and CSW spoke about residence. She confirmed that pt could return there but that he was truly homeless and had been staying in her shed. Mother asked if CSW planned to call his wife to confirm that she would pick him up. CSW endorsed plans to do this. No other concerns expressed. Contact ended without incident.   CSW spoke with Tia Pryde/wife [phone]). Wife and CSW discussed transportation. She confirmed that she would be picking up pt. CSW explained that process briefly. No other concerns or questions expressed. Contact ended without incident.   CSW arranged aftercare with [facility] ACT team lead, Liborio Nixon. Appointment scheduled and added to the discharge paperwork/AVS. Liborio Nixon inquired about his prescriptions as team was technically not working today. CSW stated that he would have to asked provider about this. No other concerns expressed. Contact ended without incident. CSW spoke with provider and pt. Pt requested paper scripts. Liborio Nixon updated regarding decision.   Vilma Meckel. Algis Greenhouse, MSW, LCSW, LCAS 09/08/2021 2:05 PM

## 2021-11-22 ENCOUNTER — Emergency Department
Admission: EM | Admit: 2021-11-22 | Discharge: 2021-11-23 | Disposition: A | Discharge status: Home / Self Care | Payer: 59 | Attending: Emergency Medicine | Admitting: Emergency Medicine

## 2021-11-22 ENCOUNTER — Other Ambulatory Visit: Payer: Self-pay

## 2021-11-22 DIAGNOSIS — F410 Panic disorder [episodic paroxysmal anxiety] without agoraphobia: Secondary | ICD-10-CM | POA: Insufficient documentation

## 2021-11-22 DIAGNOSIS — Y900 Blood alcohol level of less than 20 mg/100 ml: Secondary | ICD-10-CM | POA: Insufficient documentation

## 2021-11-22 DIAGNOSIS — F419 Anxiety disorder, unspecified: Secondary | ICD-10-CM

## 2021-11-22 LAB — COMPREHENSIVE METABOLIC PANEL
ALT: 55 U/L — ABNORMAL HIGH (ref 0–44)
AST: 31 U/L (ref 15–41)
Albumin: 4.2 g/dL (ref 3.5–5.0)
Alkaline Phosphatase: 118 U/L (ref 38–126)
Anion gap: 9 (ref 5–15)
BUN: 10 mg/dL (ref 6–20)
CO2: 22 mmol/L (ref 22–32)
Calcium: 9.2 mg/dL (ref 8.9–10.3)
Chloride: 106 mmol/L (ref 98–111)
Creatinine, Ser: 1.24 mg/dL (ref 0.61–1.24)
GFR, Estimated: >60 mL/min (ref >60)
Glucose, Bld: 113 mg/dL — ABNORMAL HIGH (ref 70–99)
Potassium: 3.8 mmol/L (ref 3.5–5.1)
Sodium: 137 mmol/L (ref 135–145)
Total Bilirubin: 0.6 mg/dL (ref 0.3–1.2)
Total Protein: 8.2 g/dL — ABNORMAL HIGH (ref 6.5–8.1)

## 2021-11-22 LAB — URINE DRUG SCREEN, QUALITATIVE (ARMC ONLY)
Amphetamines, Ur Screen: NOT DETECTED
Barbiturates, Ur Screen: NOT DETECTED
Benzodiazepine, Ur Scrn: NOT DETECTED
Cannabinoid 50 Ng, Ur ~~LOC~~: NOT DETECTED
Cocaine Metabolite,Ur ~~LOC~~: NOT DETECTED
MDMA (Ecstasy)Ur Screen: NOT DETECTED
Methadone Scn, Ur: NOT DETECTED
Opiate, Ur Screen: NOT DETECTED
Phencyclidine (PCP) Ur S: NOT DETECTED
Tricyclic, Ur Screen: NOT DETECTED

## 2021-11-22 LAB — CBC
HCT: 44.1 % (ref 39.0–52.0)
Hemoglobin: 15.5 g/dL (ref 13.0–17.0)
MCH: 30.2 pg (ref 26.0–34.0)
MCHC: 35.1 g/dL (ref 30.0–36.0)
MCV: 85.8 fL (ref 80.0–100.0)
Platelets: 267 10*3/uL (ref 150–400)
RBC: 5.14 MIL/uL (ref 4.22–5.81)
RDW: 12.7 % (ref 11.5–15.5)
WBC: 13.4 10*3/uL — ABNORMAL HIGH (ref 4.0–10.5)
nRBC: 0 % (ref 0.0–0.2)

## 2021-11-22 LAB — ACETAMINOPHEN LEVEL: Acetaminophen (Tylenol), Serum: <10 ug/mL — ABNORMAL LOW (ref 10–30)

## 2021-11-22 LAB — ETHANOL: Alcohol, Ethyl (B): <10 mg/dL (ref <10)

## 2021-11-22 LAB — SALICYLATE LEVEL: Salicylate Lvl: <7 mg/dL — ABNORMAL LOW (ref 7.0–30.0)

## 2021-11-22 NOTE — ED Notes (Signed)
Patient reports increased anxiety over the past 2 days.  Patient reports recent change in medications and wondering if that is causing anxiety. ?

## 2021-11-22 NOTE — ED Triage Notes (Signed)
Anxiety attacks, states ongoing for a few days. Denies SI. Wishes to have placement for treatment ?

## 2021-11-22 NOTE — ED Notes (Signed)
Pt changed into hospital scrubs at this time with this nurse and Ariel, tech in room. Pt belongings consisting of  ?1 short sleeve shirt ?1 pair of sweatpants ?1 pair of underwear ?1 pair of socks ?1 pair of black shoes ?1 grey jacket ?4 ear piercings ?Cell phone ? ?Belongings placed in bag and labeled appropriately with pt sticker at this time. Piercings placed in specimen cup and labeled with pt sticker ? ? ?

## 2021-11-22 NOTE — ED Notes (Signed)
Pt states is willing to dress out to see psychiatry.  ?

## 2021-11-23 DIAGNOSIS — F410 Panic disorder [episodic paroxysmal anxiety] without agoraphobia: Secondary | ICD-10-CM | POA: Diagnosis not present

## 2021-11-23 MED ORDER — HYDROXYZINE HCL 25 MG PO TABS: 25.0000 mg | ORAL_TABLET | Freq: Three times a day (TID) | ORAL | 0 refills | Status: DC | PRN

## 2021-11-23 MED ORDER — HYDROXYZINE HCL 25 MG PO TABS
25.0000 mg | ORAL_TABLET | Freq: Once | ORAL | Status: AC
  Administered 2021-11-23: 25 mg via ORAL
  Filled 2021-11-23: qty 1

## 2021-11-23 NOTE — Consult Note (Signed)
[facility] Face-to-Face Psychiatry Consult  ? ?Reason for Consult:  Psychaitric evaluation ?Referring Physician:  Dr. Katrinka Blazing ?Patient Identification: Joshua Shaffer. ?MRN:  [id number] ?Principal Diagnosis: Panic attacks ?Diagnosis:  Principal Problem: ?  Panic attacks ? ? ?Total Time spent with patient: 45 minutes ? ?Subjective:   ?"I'm having panic attacks and I was scared I was gonna have another one" ? ?HPI:  Joshua Shaffer., 36 y.o., male patient seen by this provider; chart reviewed and consulted with Dr. Dr. Katrinka Blazing on 11/23/21.  On evaluation Joshua Shaffer. reports that today he has been having panic attacks and he was afraid that if he stayed home he would have another one.  He reports that his girlfriend recently put him out and that he now resides with his mother is a building "out back".  ? ?At the time of the assessment, patient had taken one dose of hydroxyzine and states that he is feeling better.  Per TTS, pt presented to [facility] ED voluntarily with w/ c/o reoccurring panic attacks over the last 2 days. Pt identified being kicked out by his girlfriend as his main stressor. Pt explained that he now resides in a building at his mother's house. Pt reported that he is connected to [facility] ACT Team and his provider recently adjusted his medications. Pt attributes his panic attacks to the medication adjustments. Upon assessment, pt reported that his symptoms of panic had subsided and the administration of hydroxyzine was effective. Pt expressed concerns that his symptoms of panic would return if released. Supportive therapy was provided. Pt agreed to follow up with his provider about his medications. On assessment, pt. was calm and cooperative. Pt was oriented x4 and his thoughts were linear and intact. Pt had an appropriate mood and a responsive affect. Pt denied SI/HI/AV/H. Pt's UDS/BAL were unremarkable.  ? ?Recommendation: Discharge to home with recommendations to follow-up with [facility] and a  prescription of hydroxyzine to take PRN for anxiety. ? ?Past Psychiatric History: Schizoaffective disorder ? ?Risk to Self:   ?Risk to Others:   ?Prior Inpatient Therapy:   ?Prior Outpatient Therapy:   ? ?Past Medical History:  ?Past Medical History:  ?Diagnosis Date  ? Dizziness   ?  ?Past Surgical History:  ?Procedure Laterality Date  ? TONSILLECTOMY    ? ?Family History:  ?Family History  ?Problem Relation Age of Onset  ? Lung cancer Maternal Grandfather   ? ?Family Psychiatric  History: unknown ?Social History:  ?Social History  ? ?Substance and Sexual Activity  ?Alcohol Use No  ?   ?Social History  ? ?Substance and Sexual Activity  ?Drug Use Not Currently  ? Types: Marijuana  ?  ?Social History  ? ?Socioeconomic History  ? Marital status: Married  ?  Spouse name: Not on file  ? Number of children: Not on file  ? Years of education: Not on file  ? Highest education level: Not on file  ?Occupational History  ? Not on file  ?Tobacco Use  ? Smoking status: Every Day  ?  Packs/day: 0.50  ?  Years: 14.00  ?  Pack years: 7.00  ?  Types: Cigarettes  ? Smokeless tobacco: Former  ?  Types: Chew  ? Tobacco comments:  ?  Pt refused counseling  ?Vaping Use  ? Vaping Use: Never used  ?Substance and Sexual Activity  ? Alcohol use: No  ? Drug use: Not Currently  ?  Types: Marijuana  ? Sexual activity: Yes  ?  Comment: pt married  ?Other Topics Concern  ? Not on file  ?Social History Narrative  ? Not on file  ? ?Social Determinants of Health  ? ?Financial Resource Strain: Not on file  ?Food Insecurity: Not on file  ?Transportation Needs: Not on file  ?Physical Activity: Not on file  ?Stress: Not on file  ?Social Connections: Not on file  ? ?Additional Social History: ?  ? ?Allergies:  No Known Allergies ? ?Labs:  ?Results for orders placed or performed during the hospital encounter of 11/22/21 (from the past 48 hour(s))  ?Comprehensive metabolic panel     Status: Abnormal  ? Collection Time: 11/22/21 10:46 PM  ?Result Value Ref  Range  ? Sodium 137 135 - 145 mmol/L  ? Potassium 3.8 3.5 - 5.1 mmol/L  ?  Comment: HEMOLYSIS AT THIS LEVEL MAY AFFECT RESULT  ? Chloride 106 98 - 111 mmol/L  ? CO2 22 22 - 32 mmol/L  ? Glucose, Bld 113 (H) 70 - 99 mg/dL  ?  Comment: Glucose reference range applies only to samples taken after fasting for at least 8 hours.  ? BUN 10 6 - 20 mg/dL  ? Creatinine, Ser 1.24 0.61 - 1.24 mg/dL  ? Calcium 9.2 8.9 - 10.3 mg/dL  ? Total Protein 8.2 (H) 6.5 - 8.1 g/dL  ? Albumin 4.2 3.5 - 5.0 g/dL  ? AST 31 15 - 41 U/L  ? ALT 55 (H) 0 - 44 U/L  ? Alkaline Phosphatase 118 38 - 126 U/L  ? Total Bilirubin 0.6 0.3 - 1.2 mg/dL  ? GFR, Estimated >60 >60 mL/min  ?  Comment: (NOTE) ?Calculated using the CKD-EPI Creatinine Equation (2021) ?  ? Anion gap 9 5 - 15  ?  Comment: Performed at [facility], [address] [place], [place] [address]  ?Ethanol     Status: None  ? Collection Time: 11/22/21 10:46 PM  ?Result Value Ref Range  ? Alcohol, Ethyl (B) <10 <10 mg/dL  ?  Comment: (NOTE) ?Lowest detectable limit for serum alcohol is 10 mg/dL. ? ?For medical purposes only. ?Performed at [facility], [address] [place], ?[place] [address] ?  ?Salicylate level     Status: Abnormal  ? Collection Time: 11/22/21 10:46 PM  ?Result Value Ref Range  ? Salicylate Lvl <7.0 (L) 7.0 - 30.0 mg/dL  ?  Comment: Performed at [facility], [address] [place], [place] [address]  ?Acetaminophen level     Status: Abnormal  ? Collection Time: 11/22/21 10:46 PM  ?Result Value Ref Range  ? Acetaminophen (Tylenol), Serum <10 (L) 10 - 30 ug/mL  ?  Comment: (NOTE) ?Therapeutic concentrations vary significantly. A range of 10-30 ug/mL  ?may be an effective concentration for many patients. However, some  ?are best treated at concentrations outside of this range. ?Acetaminophen concentrations >150 ug/mL at 4 hours after ingestion  ?and >50 ug/mL at 12 hours after ingestion are often associated with  ?toxic  reactions. ? ?Performed at [facility], [address] [place], ?[place] [address] ?  ?cbc     Status: Abnormal  ? Collection Time: 11/22/21 10:46 PM  ?Result Value Ref Range  ? WBC 13.4 (H) 4.0 - 10.5 K/uL  ? RBC 5.14 4.22 - 5.81 MIL/uL  ? Hemoglobin 15.5 13.0 - 17.0 g/dL  ? HCT 44.1 39.0 - 52.0 %  ? MCV 85.8 80.0 - 100.0 fL  ? MCH 30.2 26.0 - 34.0 pg  ? MCHC  35.1 30.0 - 36.0 g/dL  ? RDW 12.7 11.5 - 15.5 %  ? Platelets 267 150 - 400 K/uL  ? nRBC 0.0 0.0 - 0.2 %  ?  Comment: Performed at [facility], [address], [place], [place] [address]  ?Urine Drug Screen, Qualitative     Status: None  ? Collection Time: 11/22/21 10:46 PM  ?Result Value Ref Range  ? Tricyclic, Ur Screen NONE DETECTED NONE DETECTED  ? Amphetamines, Ur Screen NONE DETECTED NONE DETECTED  ? MDMA (Ecstasy)Ur Screen NONE DETECTED NONE DETECTED  ? Cocaine Metabolite,Ur [place] NONE DETECTED NONE DETECTED  ? Opiate, Ur Screen NONE DETECTED NONE DETECTED  ? Phencyclidine (PCP) Ur S NONE DETECTED NONE DETECTED  ? Cannabinoid 50 Ng, Ur [place] NONE DETECTED NONE DETECTED  ? Barbiturates, Ur Screen NONE DETECTED NONE DETECTED  ? Benzodiazepine, Ur Scrn NONE DETECTED NONE DETECTED  ? Methadone Scn, Ur NONE DETECTED NONE DETECTED  ?  Comment: (NOTE) ?Tricyclics + metabolites, urine    Cutoff 1000 ng/mL ?Amphetamines + metabolites, urine  Cutoff 1000 ng/mL ?MDMA (Ecstasy), urine              Cutoff 500 ng/mL ?Cocaine Metabolite, urine          Cutoff 300 ng/mL ?Opiate + metabolites, urine        Cutoff 300 ng/mL ?Phencyclidine (PCP), urine         Cutoff 25 ng/mL ?Cannabinoid, urine                 Cutoff 50 ng/mL ?Barbiturates + metabolites, urine  Cutoff 200 ng/mL ?Benzodiazepine, urine              Cutoff 200 ng/mL ?Methadone, urine                   Cutoff 300 ng/mL ? ?The urine drug screen provides only a preliminary, unconfirmed ?analytical test result and should not be used for non-medical ?purposes. Clinical consideration and  professional judgment should ?be applied to any positive drug screen result due to possible ?interfering substances. A more specific alternate chemical method ?must be used in order to obtain a confirmed analytical result. ?Immunologist

## 2021-11-23 NOTE — ED Notes (Signed)
Breakfast tray given. °

## 2021-11-23 NOTE — BH Assessment (Addendum)
Comprehensive Clinical Assessment (CCA) Note ? ?11/23/2021 ?Joshua Shaffer. ?[id number] ?Recommendations for Services/Supports/Treatments: Consulted with Rashaun D., NP, who determined pt. does not meet inpatient psychiatric criteria. Pt can be discharged with hydroxyzine prescription and a recommendation to follow up with [facility]. Notified Dr. Tamala Julian and Arrie Aran, RN of disposition recommendation.  ? ?Joshua Shaffer. is a 36 year old, English speaking, Black male with a PMH of MDD with psychotic features, paranoid schizophrenia, anxiety, and symptoms of panic. Pt presented to [facility] ED voluntarily with w/ c/o reoccurring panic attacks over the last 2 days. Pt identified being kicked out by his girlfriend as his main stressor. Pt explained that he now resides in a building at his mother's house. Pt reported that he is connected to [facility] and his provider recently adjusted his medications. Pt attributes his panic attacks to the medication adjustments. Upon assessment, pt reported that his symptoms of panic had subsided and the administration of hydroxyzine was effective. Pt expressed concerns that his symptoms of panic would return if released. Supportive therapy was provided. Pt agreed to follow up with his provider about his medications. On assessment, pt. was calm and cooperative. Pt was oriented x4 and his thoughts were linear and intact. Pt had an appropriate mood and a responsive affect. Pt denied SI/HI/AV/H. Pt's UDS/BAL were unremarkable.  ?Chief Complaint:  ?Chief Complaint  ?Patient presents with  ? Anxiety  ? 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 ?CCA Screening, Triage and Referral (STR) ? ?Patient Reported Information ?How did you hear about [place]? Self ? ?Referral name: No data recorded ?Referral phone number: No data recorded ? ?Whom do you see for routine medical problems? No data recorded ?Practice/Facility Name: No  data recorded ?Practice/Facility Phone Number: No data recorded ?Name of Contact: No data recorded ?Contact Number: No data recorded ?Contact Fax Number: No data recorded ?Prescriber Name: No data recorded ?Prescriber Address (if known): No data recorded ? ?What Is the Reason for Your Visit/Call Today? Pt presents to [facility] ED voluntarily with complaints of anxiety attacks. ? ?How Long Has This Been Causing You Problems? <Week ? ?What Do You Feel Would Help You the Most Today? Stress Management; Medication(s) ? ? ?Have You Recently Been in Any Inpatient Treatment (Hospital/Detox/Crisis Center/28-Day Program)? No data recorded ?Name/Location of Program/Hospital:No data recorded ?How Long Were You There? No data recorded ?When Were You Discharged? No data recorded ? ?Have You Ever Received Services From [facility] Before? No data recorded ?Who Do You See at [facility]? No data recorded ? ?Have You Recently Had Any Thoughts About Hurting Yourself? No ? ?Are You Planning to Commit Suicide/Harm Yourself At This time? No ? ? ?Have you Recently Had Thoughts About [place]? No ? ?Explanation: No data recorded ? ?Have You Used Any Alcohol or Drugs in the Past 24 Hours? No ? ?How Long Ago Did You Use Drugs or Alcohol? No data recorded ?What Did You Use and How Much? No data recorded ? ?Do You Currently Have a Therapist/Psychiatrist? Yes ? ?Name of Therapist/Psychiatrist: [facility] ? ? ?Have You Been Recently Discharged From Any Office Practice or Programs? No ? ?Explanation of Discharge From Practice/Program: No data recorded ? ?  ?CCA Screening Triage Referral Assessment ?Type of Contact: Face-to-Face ? ?Is this Initial or Reassessment? No data recorded ?Date Telepsych consult ordered in [facility]:  No data recorded ?Time Telepsych consult ordered in [facility]:  No data  recorded ? ?Patient Reported Information Reviewed? No data recorded ?Patient Left Without Being Seen? No data recorded ?Reason for Not Completing  Assessment: No data recorded ? ?Collateral Involvement: None provided ? ? ?Does Patient Have a Stage manager Guardian? No data recorded ?Name and Contact of Legal Guardian: No data recorded ?If Minor and Not Living with Parent(s), Who has Custody? n/a ? ?Is CPS involved or ever been involved? Never ? ?Is APS involved or ever been involved? Never ? ? ?Patient Determined To Be At Risk for Harm To Self or Others Based on Review of Patient Reported Information or Presenting Complaint? No ? ?Method: No data recorded ?Availability of Means: No data recorded ?Intent: No data recorded ?Notification Required: No data recorded ?Additional Information for Danger to Others Potential: No data recorded ?Additional Comments for Danger to Others Potential: No data recorded ?Are There Guns or Other Weapons in [place]? No data recorded ?Types of Guns/Weapons: No data recorded ?Are These Weapons Safely Secured?                            No data recorded ?Who Could Verify You Are Able To Have These Secured: No data recorded ?Do You Have any Outstanding Charges, Pending Court Dates, Parole/Probation? No data recorded ?Contacted To Inform of Risk of Harm To Self or Others: -- (n/a) ? ? ?Location of Assessment: [facility] ED ? ? ?Does Patient Present under Involuntary Commitment? No ? ?IVC Papers Initial File Date: 09/03/21 ? ? ?[place] of Residence: [place] ? ? ?Patient Currently Receiving the Following Services: ACTT Designer, fashion/clothing Treatment); Medication Management ? ? ?Determination of Need: Urgent (48 hours) ? ? ?Options For Referral: Medication Management ? ? ? ? ?CCA Biopsychosocial ?Intake/Chief Complaint:  No data recorded ?Current Symptoms/Problems: No data recorded ? ?Patient Reported Schizophrenia/Schizoaffective Diagnosis in Past: Yes ? ? ?Strengths: Patient is able to communicate and verbalize his needs. ? ?Preferences: No data recorded ?Abilities: No data recorded ? ?Type of Services Patient Feels are Needed: No  data recorded ? ?Initial Clinical Notes/Concerns: No data recorded ? ?Mental Health Symptoms ?Depression:   ?None ?  ?Duration of Depressive symptoms:  ?Greater than two weeks ?  ?Mania:   ?Racing thoughts ?  ?Anxiety:    ?Tension; Worrying; Difficulty concentrating; Restlessness ?  ?Psychosis:   ?None ?  ?Duration of Psychotic symptoms:  ?Greater than six months ?  ?Trauma:   ?N/A ?  ?Obsessions:   ?N/A ?  ?Compulsions:   ?N/A ?  ?Inattention:   ?None ?  ?Hyperactivity/Impulsivity:   ?None ?  ?Oppositional/Defiant Behaviors:   ?N/A ?  ?Emotional Irregularity:   ?N/A ?  ?Other Mood/Personality Symptoms:  No data recorded  ? ?Mental Status Exam ?Appearance and self-care  ?Stature:   ?Average ?  ?Weight:   ?Average weight ?  ?Clothing:   ?-- (Scrubs) ?  ?Grooming:   ?Normal ?  ?Cosmetic use:   ?None ?  ?Posture/gait:   ?Normal ?  ?Motor activity:   ?Not Remarkable ?  ?Sensorium  ?Attention:   ?Normal ?  ?Concentration:   ?Normal ?  ?Orientation:   ?Situation; Place; Person; Object ?  ?Recall/memory:   ?Normal ?  ?Affect and Mood  ?Affect:   ?Appropriate ?  ?Mood:   ?Anxious ?  ?Relating  ?Eye contact:   ?Normal ?  ?Facial expression:   ?Responsive ?  ?Attitude toward examiner:   ?Cooperative ?  ?Thought and Language  ?Speech flow:  ?  Clear and Coherent ?  ?Thought content:   ?Appropriate to Mood and Circumstances ?  ?Preoccupation:   ?None ?  ?Hallucinations:   ?None ?  ?Organization:  No data recorded  ?Executive Functions  ?Fund of Knowledge:   ?[place] ?  ?Intelligence:   ?Average ?  ?Abstraction:   ?Normal ?  ?Judgement:   ?Good ?  ?Reality Testing:   ?Realistic ?  ?Insight:   ?Good ?  ?Decision Making:   ?Normal ?  ?Social Functioning  ?Social Maturity:   ?Responsible ?  ?Social Judgement:   ?Normal ?  ?Stress  ?Stressors:   ?Designer, multimedia; Transitions ?  ?Coping Ability:   ?Overwhelmed ?  ?Skill Deficits:   ?None ?  ?Supports:   ?Family; Friends/Service system ?  ? ? ?Religion: ?Religion/Spirituality ?Are You A  Religious Person?: No ? ?Leisure/Recreation: ?Leisure / Recreation ?Do You Have Hobbies?: Yes ?Leisure and Hobbies: "I used to play video games." He states his mental health and the types of games th

## 2021-11-23 NOTE — ED Provider Notes (Addendum)
? ?[facility] ?Provider Note ? ? ? Event Date/Time  ? First MD Initiated Contact with Patient 11/23/21 0008   ?  (approximate) ? ? ?History  ? ?Anxiety ? ? ?HPI ? ?Joshua Shaffer. is a 36 y.o. male who presents to the ED for evaluation of Anxiety ?  ?I reviewed behavioral health DC summary from 1/2.  Major depression and psychotic disorder. ? ?Patient presents to the ED for evaluation of anxiety and panic attacks.  Reports he was seated at home when he developed rapid onset of a panic attack, feeling like he was going to die and with extreme anxiety.  Reports this lasted about an hour before improving when he arrived to the ED.  He reports feeling better now.  He reports fear of the same occurring when he returns home.  He is requesting psychiatric evaluation.  Denies any recent medical illnesses.  Reports tolerating p.o. intake and toileting at his baseline. ? ? ?Physical Exam  ? ?Triage Vital Signs: ?ED Triage Vitals [11/22/21 2241]  ?Enc Vitals Group  ?   BP (!) 141/99  ?   Pulse Rate 78  ?   Resp 18  ?   Temp 98.2 ?F (36.8 ?C)  ?   Temp Source Oral  ?   SpO2 100 %  ?   Weight   ?   Height   ?   Head Circumference   ?   Peak Flow   ?   Pain Score 4  ?   Pain Loc   ?   Pain Edu?   ?   Excl. in GC?   ? ? ?Most recent vital signs: ?Vitals:  ? 11/22/21 2241  ?BP: (!) 141/99  ?Pulse: 78  ?Resp: 18  ?Temp: 98.2 ?F (36.8 ?C)  ?SpO2: 100%  ? ? ?General: Awake, no distress.  Flat affect, but pleasant with linear thought processes. ?CV:  Good peripheral perfusion.  ?Resp:  Normal effort.  ?Abd:  No distention. Soft and benign throughout ?MSK:  No deformity noted.  ?Neuro:  No focal deficits appreciated. ?Other:   ? ? ?ED Results / Procedures / Treatments  ? ?Labs ?(all labs ordered are listed, but only abnormal results are displayed) ?Labs Reviewed  ?COMPREHENSIVE METABOLIC PANEL - Abnormal; Notable for the following components:  ?    Result Value  ? Glucose, Bld 113 (*)   ? Total Protein 8.2 (*)    ? ALT 55 (*)   ? All other components within normal limits  ?SALICYLATE LEVEL - Abnormal; Notable for the following components:  ? Salicylate Lvl <7.0 (*)   ? All other components within normal limits  ?ACETAMINOPHEN LEVEL - Abnormal; Notable for the following components:  ? Acetaminophen (Tylenol), Serum <10 (*)   ? All other components within normal limits  ?CBC - Abnormal; Notable for the following components:  ? WBC 13.4 (*)   ? All other components within normal limits  ?ETHANOL  ?URINE DRUG SCREEN, QUALITATIVE ([facility] ONLY)  ? ? ?EKG ? ? ?RADIOLOGY ? ? ?Official radiology report(s): ?No results found. ? ?PROCEDURES and INTERVENTIONS: ? ?Procedures ? ?Medications  ?hydrOXYzine (ATARAX) tablet 25 mg (25 mg Oral Given 11/23/21 0116)  ? ? ? ?IMPRESSION / MDM / ASSESSMENT AND PLAN / ED COURSE  ?I reviewed the triage vital signs and the nursing notes. ? ?36 year old male presents to the ED voluntarily for evaluation of increasing anxiety and panic attacks.  Without evidence of medical pathology, overdoses  or toxidromes.  No barriers to psychiatric evaluation.  Blood work is reassuring normal metabolic panel.  Nonspecific and mild leukocytosis is noted.  We will consult with psychiatry for evaluation. ? ?Clinical Course as of 11/23/21 0337  ?Wynelle Link Nov 23, 2021  ?0112 The patient has been placed in psychiatric observation due to the need to provide a safe environment for the patient while obtaining psychiatric consultation and evaluation, as well as ongoing medical and medication management to treat the patient's condition.? The patient has not been placed under full IVC at this time. ? ? [DS]  ?0337 Patient seen and evaluated by our psychiatric team, who recommends discharging with hydroxyzine prescription.  I think this is reasonable.  He continues to look well without any mania, suicidality or acute features.  Patient reports feeling better with hydroxyzine.  We will discharge with a prescription for this and  instructions for close return precautions and following up with [facility]. [DS]  ?  ?Clinical Course User Index ?[DS] Delton Prairie, MD  ? ? ? ?FINAL CLINICAL IMPRESSION(S) / ED DIAGNOSES  ? ?Final diagnoses:  ?Anxiety  ?Panic attack  ? ? ? ?Rx / DC Orders  ? ?ED Discharge Orders   ? ?      Ordered  ?  hydrOXYzine (ATARAX) 25 MG tablet  3 times daily PRN       ? 11/23/21 6073  ? ?  ?  ? ?  ? ? ? ?Note:  This document was prepared using Dragon voice recognition software and may include unintentional dictation errors. ?  ?Delton Prairie, MD ?11/23/21 0112 ? ?  ?Delton Prairie, MD ?11/23/21 [phone] ? ?

## 2021-11-23 NOTE — ED Notes (Signed)
Patient called ride. Belongings returned by this rn. E-signature not working at this time. Pt verbalized understanding of D/C instructions, prescriptions and follow up care with no further questions at this time. Pt in NAD and ambulatory at time of D/C. ? ?

## 2021-11-23 NOTE — ED Notes (Signed)
TTS and psych provider in with patient. ?

## 2021-11-23 NOTE — ED Notes (Signed)
VOL/Pending D/C with follow up to [facility] ?

## 2021-11-24 ENCOUNTER — Emergency Department
Admission: EM | Admit: 2021-11-24 | Discharge: 2021-11-25 | Disposition: A | Discharge status: Home / Self Care | Payer: 59 | Attending: Emergency Medicine | Admitting: Emergency Medicine

## 2021-11-24 ENCOUNTER — Other Ambulatory Visit: Payer: Self-pay

## 2021-11-24 ENCOUNTER — Encounter: Payer: Self-pay | Admitting: *Deleted

## 2021-11-24 DIAGNOSIS — Z20822 Contact with and (suspected) exposure to covid-19: Secondary | ICD-10-CM | POA: Insufficient documentation

## 2021-11-24 DIAGNOSIS — F418 Other specified anxiety disorders: Secondary | ICD-10-CM | POA: Diagnosis present

## 2021-11-24 DIAGNOSIS — G4700 Insomnia, unspecified: Secondary | ICD-10-CM | POA: Diagnosis present

## 2021-11-24 DIAGNOSIS — F419 Anxiety disorder, unspecified: Secondary | ICD-10-CM

## 2021-11-24 DIAGNOSIS — F333 Major depressive disorder, recurrent, severe with psychotic symptoms: Secondary | ICD-10-CM | POA: Insufficient documentation

## 2021-11-24 DIAGNOSIS — Z79899 Other long term (current) drug therapy: Secondary | ICD-10-CM | POA: Insufficient documentation

## 2021-11-24 DIAGNOSIS — F410 Panic disorder [episodic paroxysmal anxiety] without agoraphobia: Secondary | ICD-10-CM | POA: Diagnosis present

## 2021-11-24 LAB — URINE DRUG SCREEN, QUALITATIVE (ARMC ONLY)
Amphetamines, Ur Screen: NOT DETECTED
Barbiturates, Ur Screen: NOT DETECTED
Benzodiazepine, Ur Scrn: NOT DETECTED
Cannabinoid 50 Ng, Ur ~~LOC~~: POSITIVE — AB
Cocaine Metabolite,Ur ~~LOC~~: NOT DETECTED
MDMA (Ecstasy)Ur Screen: NOT DETECTED
Methadone Scn, Ur: NOT DETECTED
Opiate, Ur Screen: NOT DETECTED
Phencyclidine (PCP) Ur S: NOT DETECTED
Tricyclic, Ur Screen: POSITIVE — AB

## 2021-11-24 LAB — CBC
HCT: 47 % (ref 39.0–52.0)
Hemoglobin: 16.5 g/dL (ref 13.0–17.0)
MCH: 30.2 pg (ref 26.0–34.0)
MCHC: 35.1 g/dL (ref 30.0–36.0)
MCV: 86.1 fL (ref 80.0–100.0)
Platelets: 309 10*3/uL (ref 150–400)
RBC: 5.46 MIL/uL (ref 4.22–5.81)
RDW: 13.1 % (ref 11.5–15.5)
WBC: 15.2 10*3/uL — ABNORMAL HIGH (ref 4.0–10.5)
nRBC: 0 % (ref 0.0–0.2)

## 2021-11-24 LAB — COMPREHENSIVE METABOLIC PANEL
ALT: 42 U/L (ref 0–44)
AST: 22 U/L (ref 15–41)
Albumin: 4.7 g/dL (ref 3.5–5.0)
Alkaline Phosphatase: 113 U/L (ref 38–126)
Anion gap: 10 (ref 5–15)
BUN: 10 mg/dL (ref 6–20)
CO2: 25 mmol/L (ref 22–32)
Calcium: 9.1 mg/dL (ref 8.9–10.3)
Chloride: 103 mmol/L (ref 98–111)
Creatinine, Ser: 1.18 mg/dL (ref 0.61–1.24)
GFR, Estimated: >60 mL/min (ref >60)
Glucose, Bld: 115 mg/dL — ABNORMAL HIGH (ref 70–99)
Potassium: 3.6 mmol/L (ref 3.5–5.1)
Sodium: 138 mmol/L (ref 135–145)
Total Bilirubin: 0.6 mg/dL (ref 0.3–1.2)
Total Protein: 8.9 g/dL — ABNORMAL HIGH (ref 6.5–8.1)

## 2021-11-24 LAB — ETHANOL: Alcohol, Ethyl (B): <10 mg/dL (ref <10)

## 2021-11-24 MED ORDER — ACETAMINOPHEN 325 MG PO TABS
650.0000 mg | ORAL_TABLET | Freq: Once | ORAL | Status: AC
  Administered 2021-11-24: 650 mg via ORAL
  Filled 2021-11-24: qty 2

## 2021-11-24 NOTE — ED Provider Notes (Signed)
? ?[facility] ?Provider Note ? ? ? Event Date/Time  ? First MD Initiated Contact with Patient 11/24/21 2310   ?  (approximate) ? ? ?History  ? ?Anxiety ? ? ?HPI ? ?Joshua Shaffer. is a 36 y.o. male who returns to the ED from home with a chief complaint of anxiety.  He was seen in the ED recently by psychiatry and discharged on hydroxyzine.  States it is not helping.  Patient desiring to be hospitalized for his anxiety.  He was instructed to return to the ED at the recommendation of his ACT team.  Voices no medical complaints. ?  ? ? ?Past Medical History  ? ?Past Medical History:  ?Diagnosis Date  ? Dizziness   ? ? ? ?Active Problem List  ? ?Patient Active Problem List  ? Diagnosis Date Noted  ? Severe recurrent major depressive disorder with psychotic features (HCC) 09/04/2021  ? Chronic midline low back pain without sciatica 03/15/2021  ? Panic attacks 12/15/2018  ? Insomnia 03/27/2017  ? Anxiety with depression 02/25/2017  ? ? ? ?Past Surgical History  ? ?Past Surgical History:  ?Procedure Laterality Date  ? TONSILLECTOMY    ? ? ? ?Home Medications  ? ?Prior to Admission medications   ?Medication Sig Start Date End Date Taking? Authorizing Provider  ?benztropine (COGENTIN) 1 MG tablet Take 1 tablet (1 mg total) by mouth daily. 09/09/21 10/09/21  Thalia Party, MD  ?FLUoxetine (PROZAC) 20 MG capsule Take 1 capsule (20 mg total) by mouth daily. 09/09/21 10/09/21  Thalia Party, MD  ?gabapentin (NEURONTIN) 100 MG capsule Take 1 capsule (100 mg total) by mouth 3 (three) times daily. 09/08/21 10/08/21  Thalia Party, MD  ?haloperidol (HALDOL) 5 MG tablet Take 1 tablet (5 mg total) by mouth 2 (two) times daily. 09/08/21 10/08/21  Thalia Party, MD  ?hydrOXYzine (ATARAX) 25 MG tablet Take 1 tablet (25 mg total) by mouth 3 (three) times daily as needed for anxiety. 11/23/21   Delton Prairie, MD  ?mirtazapine (REMERON) 15 MG tablet Take 1 tablet (15 mg total) by mouth at bedtime. 09/08/21 10/08/21  Thalia Party, MD   ?QUEtiapine (SEROQUEL) 100 MG tablet Take 1 tablet (100 mg total) by mouth 2 (two) times daily. 09/08/21 10/08/21  Thalia Party, MD  ? ? ? ?Allergies  ?Patient has no known allergies. ? ? ?Family History  ? ?Family History  ?Problem Relation Age of Onset  ? Lung cancer Maternal Grandfather   ? ? ? ?Physical Exam  ?Triage Vital Signs: ?ED Triage Vitals  ?Enc Vitals Group  ?   BP 11/24/21 2257 122/90  ?   Pulse Rate 11/24/21 2257 83  ?   Resp 11/24/21 2257 17  ?   Temp 11/24/21 2257 98.6 ?F (37 ?C)  ?   Temp src --   ?   SpO2 11/24/21 2257 97 %  ?   Weight 11/24/21 2236 185 lb (83.9 kg)  ?   Height 11/24/21 2236 5\' 11"  (1.803 m)  ?   Head Circumference --   ?   Peak Flow --   ?   Pain Score 11/24/21 2235 0  ?   Pain Loc --   ?   Pain Edu? --   ?   Excl. in GC? --   ? ? ?Updated Vital Signs: ?BP 122/90 (BP Location: Right Arm)   Pulse 83   Temp 98.6 ?F (37 ?C)   Resp 17   Ht 5\' 11"  (1.803 m)  Wt 83.9 kg   SpO2 97%   BMI 25.80 kg/m?  ? ? ?General: Awake, no distress.  ?CV:  RRR.  Good peripheral perfusion.  ?Resp:  Normal effort.  CTA B ?Abd:  No distention.  ?Other:  Anxious. ? ? ?ED Results / Procedures / Treatments  ?Labs ?(all labs ordered are listed, but only abnormal results are displayed) ?Labs Reviewed  ?COMPREHENSIVE METABOLIC PANEL - Abnormal; Notable for the following components:  ?    Result Value  ? Glucose, Bld 115 (*)   ? Total Protein 8.9 (*)   ? All other components within normal limits  ?CBC - Abnormal; Notable for the following components:  ? WBC 15.2 (*)   ? All other components within normal limits  ?URINE DRUG SCREEN, QUALITATIVE ([facility] ONLY) - Abnormal; Notable for the following components:  ? Tricyclic, Ur Screen POSITIVE (*)   ? Cannabinoid 50 Ng, Ur [place] POSITIVE (*)   ? All other components within normal limits  ?RESP PANEL BY RT-PCR (FLU A&B, COVID) ARPGX2  ?ETHANOL  ? ? ? ?EKG ? ?None ? ? ?RADIOLOGY ?None ? ? ?Official radiology report(s): ?No results found. ? ? ?PROCEDURES: ? ?Critical  Care performed: No ? ?Procedures ? ? ?MEDICATIONS ORDERED IN ED: ?Medications  ?acetaminophen (TYLENOL) tablet 650 mg (650 mg Oral Given 11/24/21 2348)  ? ? ? ?IMPRESSION / MDM / ASSESSMENT AND PLAN / ED COURSE  ?I reviewed the triage vital signs and the nursing notes. ?             ?               ?36 year old male who returns to the ED for persistent anxiety, requesting behavioral health evaluation.  Contracts for safety while in the emergency department. The patient has been placed in psychiatric observation due to the need to provide a safe environment for the patient while obtaining psychiatric consultation and evaluation, as well as ongoing medical and medication management to treat the patient's condition.  The patient has not been placed under full IVC at this time. ? ?0210 ?Patient seen by psychiatric NP who recommends hospitalization. ? ?FINAL CLINICAL IMPRESSION(S) / ED DIAGNOSES  ? ?Final diagnoses:  ?Anxiety  ? ? ? ?Rx / DC Orders  ? ?ED Discharge Orders   ? ? None  ? ?  ? ? ? ?Note:  This document was prepared using Dragon voice recognition software and may include unintentional dictation errors. ?  ?Irean Hong, MD ?11/25/21 [phone] ? ?

## 2021-11-24 NOTE — ED Triage Notes (Signed)
Pt brought in via ems from home with anxiety.  Pt denies SI or HI.  Pt here recently for similar sx.  Pt states he is not any better.  Pt in hallway bed.  Pt alert.  ?

## 2021-11-24 NOTE — ED Notes (Signed)
Pt brought in via ems from home with anxiety tonight.  Pt states he is taking his meds, but not feeling any better.  Pt denies Si or HI.  Pt calm and cooperative.  Pt alert.    Pt in hallway bed.    ?

## 2021-11-24 NOTE — ED Notes (Signed)
Pt dressed out into wine scrubs belonging bags contain: ?Earrings ?Black underwear ?Black shoes ?Black pants ?Phone ?Gray jacket ?Striped shirt ?Blue hat ? ?Pt has a brow piercing in do to not being able to take it out  ? ?

## 2021-11-25 ENCOUNTER — Inpatient Hospital Stay
Admission: AD | Admit: 2021-11-25 | Discharge: 2021-11-28 | DRG: 885 | Disposition: A | Discharge status: Home / Self Care | Payer: 59 | Source: Intra-hospital | Attending: Psychiatry | Admitting: Psychiatry

## 2021-11-25 ENCOUNTER — Encounter: Payer: Self-pay | Admitting: Psychiatry

## 2021-11-25 DIAGNOSIS — F29 Unspecified psychosis not due to a substance or known physiological condition: Secondary | ICD-10-CM | POA: Diagnosis present

## 2021-11-25 DIAGNOSIS — F333 Major depressive disorder, recurrent, severe with psychotic symptoms: Secondary | ICD-10-CM | POA: Diagnosis not present

## 2021-11-25 DIAGNOSIS — G4700 Insomnia, unspecified: Secondary | ICD-10-CM | POA: Diagnosis present

## 2021-11-25 DIAGNOSIS — F17210 Nicotine dependence, cigarettes, uncomplicated: Secondary | ICD-10-CM | POA: Diagnosis present

## 2021-11-25 DIAGNOSIS — F410 Panic disorder [episodic paroxysmal anxiety] without agoraphobia: Secondary | ICD-10-CM | POA: Diagnosis present

## 2021-11-25 DIAGNOSIS — Z79899 Other long term (current) drug therapy: Secondary | ICD-10-CM | POA: Diagnosis not present

## 2021-11-25 DIAGNOSIS — Z79891 Long term (current) use of opiate analgesic: Secondary | ICD-10-CM

## 2021-11-25 LAB — RESP PANEL BY RT-PCR (FLU A&B, COVID) ARPGX2
Influenza A by PCR: NEGATIVE
Influenza B by PCR: NEGATIVE
SARS Coronavirus 2 by RT PCR: NEGATIVE

## 2021-11-25 MED ORDER — MAGNESIUM HYDROXIDE 400 MG/5ML PO SUSP: 30.0000 mL | Freq: Every day | ORAL | Status: DC | PRN

## 2021-11-25 MED ORDER — ACETAMINOPHEN 325 MG PO TABS: 650.0000 mg | ORAL_TABLET | Freq: Four times a day (QID) | ORAL | Status: DC | PRN

## 2021-11-25 MED ORDER — ALUM & MAG HYDROXIDE-SIMETH 200-200-20 MG/5ML PO SUSP: 30.0000 mL | ORAL | Status: DC | PRN

## 2021-11-25 NOTE — Group Note (Cosign Needed)
LCSW Group Therapy Note ? ? ?Group Date: 11/25/2021 ?Start Time: 1300 ?End Time: 1400 ? ? ?Type of Therapy and Topic:  Group Therapy: Boundaries ? ?Participation Level:  Did Not Attend ? ?Description of Group: ?This group will address the use of boundaries in their personal lives. Patients will explore why boundaries are important, the difference between healthy and unhealthy boundaries, and negative and postive outcomes of different boundaries and will look at how boundaries can be crossed.  Patients will be encouraged to identify current boundaries in their own lives and identify what kind of boundary is being set. Facilitators will guide patients in utilizing problem-solving interventions to address and correct types boundaries being used and to address when no boundary is being used. Understanding and applying boundaries will be explored and addressed for obtaining and maintaining a balanced life. Patients will be encouraged to explore ways to assertively make their boundaries and needs known to significant others in their lives, using other group members and facilitator for role play, support, and feedback. ? ?Therapeutic Goals: ? ?1.  Patient will identify areas in their life where setting clear boundaries could be  used to improve their life.  ?2.  Patient will identify signs/triggers that a boundary is not being respected. ?3.  Patient will identify two ways to set boundaries in order to achieve balance in  their lives: ?4.  Patient will demonstrate ability to communicate their needs and set boundaries  through discussion and/or role plays ? ?Summary of Patient Progress: Patient did not attend. ? ?Therapeutic Modalities:   ?Cognitive Behavioral Therapy ?Solution-Focused Therapy ? ?Rosezella Florida, LCSWA ?11/25/2021  2:14 PM   ? ?

## 2021-11-25 NOTE — BH Assessment (Signed)
Patient is to be admitted to [facility]U today 11/25/21 by Dr. Weber Cooks.  ?Attending Physician will be Dr.  Weber Cooks .   ?Patient has been assigned to room 304, by [place], Junita Push.   ? ?ER staff is aware of the admission: ?Lattie Haw, ER Secretary   ?Dr. Tamala Julian, ER MD  ?Luetta Nutting Patient's Nurse  ?Seth Bake, Patient Access.  ?

## 2021-11-25 NOTE — Consult Note (Signed)
[facility] Face-to-Face Psychiatry Consult  ? ?Reason for Consult: Anxiety ?Referring Physician: Dr. Dolores Frame ?Patient Identification: Joshua Shaffer. ?MRN:  [id number] ?Principal Diagnosis: <principal problem not specified> ?Diagnosis:  Active Problems: ?  Anxiety with depression ?  Insomnia ?  Panic attacks ?  Severe recurrent major depressive disorder with psychotic features (HCC) ? ? ?Total Time spent with patient: 1 hour ? ?Subjective: "If I am discharge. I will be right back." ? ?Joshua Shaffer. is a 36 y.o. male patient presented to [facility] ED via EMS and voluntary. The patient is here with complaints of increased anxiety along with panic attacks. The patient was in the ED a few days ago, complaining of similar problems. The patient voiced that he is not better. He shared that he is anxious; he felt that something would happen to him at home. The patient reported, "I been having panic attacks and anxiety; I was here for the same thing Sunday." "I don't know what has me anxious; I feel like my head is splitting open." The patient was asked about reaching out to his ACT team for assistance. The patient expressed that his ACT team does not help. "I feel like I just need my medication adjusted." The patient shared that he will return to the ED if he is discharged. ?This provider saw the patient face-to-face; the chart was reviewed, and consulted with Dr. Dolores Frame on 11/25/2021 due to the patient's care. It was discussed with the EDP that the patient does meet the criteria to be admitted to the psychiatric inpatient unit.  ?On evaluation, the patient is alert and oriented x 4, somewhat anxious but cooperative, and mood-congruent with affect. The patient does not appear to be responding to internal or external stimuli. Neither is the patient presenting with any delusional thinking. The patient denies auditory or visual hallucinations. The patient denies any suicidal, homicidal, or self-harm ideations. The patient voiced  increased anxiety along with panic attacks. The patient states he does not know what is causing him to feel this way. The patient's UDS is positive for Cannabinoids. He remains complaint with his prescribed psychiatric medications. The patient is not presenting with any psychotic or paranoid behaviors. During an encounter with the patient, he could answer questions appropriately. ? ?HPI: Per Dr. Dolores Frame, Joshua Shaffer. is a 36 y.o. male who returns to the ED from home with a chief complaint of anxiety.  He was seen in the ED recently by psychiatry and discharged on hydroxyzine.  States it is not helping.  Patient desiring to be hospitalized for his anxiety.  He was instructed to return to the ED at the recommendation of his ACT team.  Voices no medical complaints. ? ?Past Psychiatric History: History reviewed. No pertinent past psychiatric history ? ?Risk to Self:   ?Risk to Others:   ?Prior Inpatient Therapy:   ?Prior Outpatient Therapy:   ? ?Past Medical History:  ?Past Medical History:  ?Diagnosis Date  ? Dizziness   ?  ?Past Surgical History:  ?Procedure Laterality Date  ? TONSILLECTOMY    ? ?Family History:  ?Family History  ?Problem Relation Age of Onset  ? Lung cancer Maternal Grandfather   ? ?Family Psychiatric  History:  ?Social History:  ?Social History  ? ?Substance and Sexual Activity  ?Alcohol Use No  ?   ?Social History  ? ?Substance and Sexual Activity  ?Drug Use Not Currently  ? Types: Marijuana  ?  ?Social History  ? ?Socioeconomic History  ?  Marital status: Married  ?  Spouse name: Not on file  ? Number of children: Not on file  ? Years of education: Not on file  ? Highest education level: Not on file  ?Occupational History  ? Not on file  ?Tobacco Use  ? Smoking status: Every Day  ?  Packs/day: 0.50  ?  Years: 14.00  ?  Pack years: 7.00  ?  Types: Cigarettes  ? Smokeless tobacco: Former  ?  Types: Chew  ? Tobacco comments:  ?  Pt refused counseling  ?Vaping Use  ? Vaping Use: Never used   ?Substance and Sexual Activity  ? Alcohol use: No  ? Drug use: Not Currently  ?  Types: Marijuana  ? Sexual activity: Yes  ?  Comment: pt married  ?Other Topics Concern  ? Not on file  ?Social History Narrative  ? Not on file  ? ?Social Determinants of Health  ? ?Financial Resource Strain: Not on file  ?Food Insecurity: Not on file  ?Transportation Needs: Not on file  ?Physical Activity: Not on file  ?Stress: Not on file  ?Social Connections: Not on file  ? ?Additional Social History: ?  ? ?Allergies:  No Known Allergies ? ?Labs:  ?Results for orders placed or performed during the hospital encounter of 11/24/21 (from the past 48 hour(s))  ?Comprehensive metabolic panel     Status: Abnormal  ? Collection Time: 11/24/21 10:43 PM  ?Result Value Ref Range  ? Sodium 138 135 - 145 mmol/L  ? Potassium 3.6 3.5 - 5.1 mmol/L  ? Chloride 103 98 - 111 mmol/L  ? CO2 25 22 - 32 mmol/L  ? Glucose, Bld 115 (H) 70 - 99 mg/dL  ?  Comment: Glucose reference range applies only to samples taken after fasting for at least 8 hours.  ? BUN 10 6 - 20 mg/dL  ? Creatinine, Ser 1.18 0.61 - 1.24 mg/dL  ? Calcium 9.1 8.9 - 10.3 mg/dL  ? Total Protein 8.9 (H) 6.5 - 8.1 g/dL  ? Albumin 4.7 3.5 - 5.0 g/dL  ? AST 22 15 - 41 U/L  ? ALT 42 0 - 44 U/L  ? Alkaline Phosphatase 113 38 - 126 U/L  ? Total Bilirubin 0.6 0.3 - 1.2 mg/dL  ? GFR, Estimated >60 >60 mL/min  ?  Comment: (NOTE) ?Calculated using the CKD-EPI Creatinine Equation (2021) ?  ? Anion gap 10 5 - 15  ?  Comment: Performed at [facility], [address] [place], [place] [address]  ?Ethanol     Status: None  ? Collection Time: 11/24/21 10:43 PM  ?Result Value Ref Range  ? Alcohol, Ethyl (B) <10 <10 mg/dL  ?  Comment: (NOTE) ?Lowest detectable limit for serum alcohol is 10 mg/dL. ? ?For medical purposes only. ?Performed at [facility], [address] [place], ?[place] [id number] ?  ?cbc     Status: Abnormal  ? Collection Time: 11/24/21 10:43 PM  ?Result Value Ref Range   ? WBC 15.2 (H) 4.0 - 10.5 K/uL  ? RBC 5.46 4.22 - 5.81 MIL/uL  ? Hemoglobin 16.5 13.0 - 17.0 g/dL  ? HCT 47.0 39.0 - 52.0 %  ? MCV 86.1 80.0 - 100.0 fL  ? MCH 30.2 26.0 - 34.0 pg  ? MCHC 35.1 30.0 - 36.0 g/dL  ? RDW 13.1 11.5 - 15.5 %  ? Platelets 309 150 - 400 K/uL  ? nRBC 0.0 0.0 - 0.2 %  ?  Comment: Performed  at [facility], [address], [place], [place] [address]  ?Urine Drug Screen, Qualitative     Status: Abnormal  ? Collection Time: 11/24/21 10:43 PM  ?Result Value Ref Range  ? Tricyclic, Ur Screen POSITIVE (A) NONE DETECTED  ? Amphetamines, Ur Screen NONE DETECTED NONE DETECTED  ? MDMA (Ecstasy)Ur Screen NONE DETECTED NONE DETECTED  ? Cocaine Metabolite,Ur [place] NONE DETECTED NONE DETECTED  ? Opiate, Ur Screen NONE DETECTED NONE DETECTED  ? Phencyclidine (PCP) Ur S NONE DETECTED NONE DETECTED  ? Cannabinoid 50 Ng, Ur Tull POSITIVE (A) NONE DETECTED  ? Barbiturates, Ur Screen NONE DETECTED NONE DETECTED  ? Benzodiazepine, Ur Scrn NONE DETECTED NONE DETECTED  ? Methadone Scn, Ur NONE DETECTED NONE DETECTED  ?  Comment: (NOTE) ?Tricyclics + metabolites, urine    Cutoff 1000 ng/mL ?Amphetamines + metabolites, urine  Cutoff 1000 ng/mL ?MDMA (Ecstasy), urine              Cutoff 500 ng/mL ?Cocaine Metabolite, urine          Cutoff 300 ng/mL ?Opiate + metabolites, urine        Cutoff 300 ng/mL ?Phencyclidine (PCP), urine         Cutoff 25 ng/mL ?Cannabinoid, urine                 Cutoff 50 ng/mL ?Barbiturates + metabolites, urine  Cutoff 200 ng/mL ?Benzodiazepine, urine              Cutoff 200 ng/mL ?Methadone, urine                   Cutoff 300 ng/mL ? ?The urine drug screen provides only a preliminary, unconfirmed ?analytical test result and should not be used for non-medical ?purposes. Clinical consideration and professional judgment should ?be applied to any positive drug screen result due to possible ?interfering substances. A more specific alternate chemical method ?must be used in order to obtain a  confirmed analytical result. ?Gas chromatography / mass spectrometry (GC/MS) is the preferred ?confirm atory method. ?Performed at [facility], [address] [place], ?[place] [address] ?  ? ? ?No curre

## 2021-11-25 NOTE — BH Assessment (Signed)
Patient to be reviewed with [facility] BMU 11/25/21 pending discharges on unit ?

## 2021-11-25 NOTE — Tx Team (Signed)
Initial Treatment Plan ?11/25/2021 ?2:08 PM ?[name]. ?ONG:[id number] ? ? ? ?PATIENT STRESSORS: ?Medication change or noncompliance   ? ? ?PATIENT STRENGTHS: ?Ability for insight  ?Motivation for treatment/growth  ? ? ?PATIENT IDENTIFIED PROBLEMS: ?Medication adjustment, severe anxiety  ?  ?  ?  ?  ?  ?  ?  ?  ?  ? ?DISCHARGE CRITERIA:  ?Improved stabilization in mood, thinking, and/or behavior ? ?PRELIMINARY DISCHARGE PLAN: ?Return to previous living arrangement ? ?PATIENT/FAMILY INVOLVEMENT: ?This treatment plan has been presented to and reviewed with the patient, Joshua Shaffer. The patient and family have been given the opportunity to ask questions and make suggestions. ? ?Hyman Hopes, RN ?11/25/2021, 2:08 PM ?

## 2021-11-25 NOTE — BH Assessment (Signed)
Comprehensive Clinical Assessment (CCA) Note ? ?11/25/2021 ?Joshua Shaffer. ?[id number] ? ?Chief Complaint: Patient is a 36 year old male presenting to [facility] ED voluntarily due to anxiety. Per triage note Pt brought in via ems from home with anxiety.  Pt denies SI or HI.  Pt here recently for similar sx.  Pt states he is not any better.  Pt in hallway bed.  Pt alert. During assessment patient appears alert and oriented x4, calm and cooperative. Patient reports "I been having panic attacks and anxiety, I was here for the same thing Sunday." "I don't know what has me anxious, I feel like my head is splitting open." When psyc team tried to talk with patient about discussing this with his ACT team patient reports that it does not help "I feel like I just need my medication adjusted." Patient is convinced if he is discharged that he will return back to the ED. Patient denies SI/HI/AH/VH. ? ?Per Psyc NP Elenore Paddy patient is recommended for Inpatient ?Chief Complaint  ?Patient presents with  ? Anxiety  ? ?Visit Diagnosis: Depression, Anxiety   ? ? ?CCA Screening, Triage and Referral (STR) ? ?Patient Reported Information ?How did you hear about [place]? Self ? ?Referral name: No data recorded ?Referral phone number: No data recorded ? ?Whom do you see for routine medical problems? No data recorded ?Practice/Facility Name: No data recorded ?Practice/Facility Phone Number: No data recorded ?Name of Contact: No data recorded ?Contact Number: No data recorded ?Contact Fax Number: No data recorded ?Prescriber Name: No data recorded ?Prescriber Address (if known): No data recorded ? ?What Is the Reason for Your Visit/Call Today? Patient presents voluntarily due to anxiety ? ?How Long Has This Been Causing You Problems? > than 6 months ? ?What Do You Feel Would Help You the Most Today? Treatment for Depression or other mood problem ? ? ?Have You Recently Been in Any Inpatient Treatment (Hospital/Detox/Crisis Center/28-Day  Program)? No data recorded ?Name/Location of Program/Hospital:No data recorded ?How Long Were You There? No data recorded ?When Were You Discharged? No data recorded ? ?Have You Ever Received Services From [employer] Before? No data recorded ?Who Do You See at [facility]? No data recorded ? ?Have You Recently Had Any Thoughts About Hurting Yourself? No ? ?Are You Planning to Commit Suicide/Harm Yourself At This time? No ? ? ?Have you Recently Had Thoughts About Hurting Someone Karolee Ohs? No ? ?Explanation: No data recorded ? ?Have You Used Any Alcohol or Drugs in the Past 24 Hours? No ? ?How Long Ago Did You Use Drugs or Alcohol? No data recorded ?What Did You Use and How Much? No data recorded ? ?Do You Currently Have a Therapist/Psychiatrist? Yes ? ?Name of Therapist/Psychiatrist: [facility] ? ? ?Have You Been Recently Discharged From Any Office Practice or Programs? No ? ?Explanation of Discharge From Practice/Program: No data recorded ? ?  ?CCA Screening Triage Referral Assessment ?Type of Contact: Face-to-Face ? ?Is this Initial or Reassessment? No data recorded ?Date Telepsych consult ordered in [facility]:  No data recorded ?Time Telepsych consult ordered in [facility]L:  No data recorded ? ?Patient Reported Information Reviewed? No data recorded ?Patient Left Without Being Seen? No data recorded ?Reason for Not Completing Assessment: No data recorded ? ?Collateral Involvement: None provided ? ? ?Does Patient Have a Automotive engineer Guardian? No data recorded ?Name and Contact of Legal Guardian: No data recorded ?If Minor and Not Living with Parent(s), Who has Custody? n/a ? ?Is  CPS involved or ever been involved? Never ? ?Is APS involved or ever been involved? Never ? ? ?Patient Determined To Be At Risk for Harm To Self or Others Based on Review of Patient Reported Information or Presenting Complaint? No ? ?Method: No data recorded ?Availability of Means: No data recorded ?Intent: No data recorded ?Notification  Required: No data recorded ?Additional Information for Danger to Others Potential: No data recorded ?Additional Comments for Danger to Others Potential: No data recorded ?Are There Guns or Other Weapons in [place]? No data recorded ?Types of Guns/Weapons: No data recorded ?Are These Weapons Safely Secured?                            No data recorded ?Who Could Verify You Are Able To Have These Secured: No data recorded ?Do You Have any Outstanding Charges, Pending Court Dates, Parole/Probation? No data recorded ?Contacted To Inform of Risk of Harm To Self or Others: -- (n/a) ? ? ?Location of Assessment: [facility] ED ? ? ?Does Patient Present under Involuntary Commitment? No ? ?IVC Papers Initial File Date: 09/03/21 ? ? ?[place] of Residence: [place] ? ? ?Patient Currently Receiving the Following Services: ACTT Designer, fashion/clothing Treatment); Medication Management ? ? ?Determination of Need: Emergent (2 hours) ? ? ?Options For Referral: Medication Management ? ? ? ? ?CCA Biopsychosocial ?Intake/Chief Complaint:  No data recorded ?Current Symptoms/Problems: No data recorded ? ?Patient Reported Schizophrenia/Schizoaffective Diagnosis in Past: Yes ? ? ?Strengths: Patient is able to communicate and verbalize his needs. ? ?Preferences: No data recorded ?Abilities: No data recorded ? ?Type of Services Patient Feels are Needed: No data recorded ? ?Initial Clinical Notes/Concerns: No data recorded ? ?Mental Health Symptoms ?Depression:   ?None ?  ?Duration of Depressive symptoms:  ?Greater than two weeks ?  ?Mania:   ?Racing thoughts; Irritability ?  ?Anxiety:    ?Tension; Worrying; Difficulty concentrating; Restlessness; Irritability; Fatigue ?  ?Psychosis:   ?None ?  ?Duration of Psychotic symptoms:  ?Greater than six months ?  ?Trauma:   ?N/A ?  ?Obsessions:   ?N/A ?  ?Compulsions:   ?N/A ?  ?Inattention:   ?None ?  ?Hyperactivity/Impulsivity:   ?None ?  ?Oppositional/Defiant Behaviors:   ?N/A ?  ?Emotional Irregularity:    ?N/A ?  ?Other Mood/Personality Symptoms:  No data recorded  ? ?Mental Status Exam ?Appearance and self-care  ?Stature:   ?Average ?  ?Weight:   ?Average weight ?  ?Clothing:   ?-- (Scrubs) ?  ?Grooming:   ?Normal ?  ?Cosmetic use:   ?None ?  ?Posture/gait:   ?Normal ?  ?Motor activity:   ?Not Remarkable ?  ?Sensorium  ?Attention:   ?Normal ?  ?Concentration:   ?Normal ?  ?Orientation:   ?Situation; Place; Person; Object ?  ?Recall/memory:   ?Normal ?  ?Affect and Mood  ?Affect:   ?Appropriate; Anxious ?  ?Mood:   ?Anxious ?  ?Relating  ?Eye contact:   ?Normal ?  ?Facial expression:   ?Responsive ?  ?Attitude toward examiner:   ?Cooperative ?  ?Thought and Language  ?Speech flow:  ?Clear and Coherent ?  ?Thought content:   ?Appropriate to Mood and Circumstances ?  ?Preoccupation:   ?None ?  ?Hallucinations:   ?None ?  ?Organization:  No data recorded  ?Executive Functions  ?Fund of Knowledge:   ?[place] ?  ?Intelligence:   ?Average ?  ?Abstraction:   ?Normal ?  ?Judgement:   ?  Good ?  ?Reality Testing:   ?Realistic ?  ?Insight:   ?Good ?  ?Decision Making:   ?Normal ?  ?Social Functioning  ?Social Maturity:   ?Responsible ?  ?Social Judgement:   ?Normal ?  ?Stress  ?Stressors:   ?Designer, multimedia; Transitions ?  ?Coping Ability:   ?Overwhelmed ?  ?Skill Deficits:   ?None ?  ?Supports:   ?Family; Friends/Service system ?  ? ? ?Religion: ?Religion/Spirituality ?Are You A Religious Person?: No ? ?Leisure/Recreation: ?Leisure / Recreation ?Do You Have Hobbies?: Yes ?Leisure and Hobbies: "I used to play video games." He states his mental health and the types of games that he was playing did not mesh well together. ? ?Exercise/Diet: ?Exercise/Diet ?Do You Exercise?: No ?Have You Gained or Lost A Significant Amount of Weight in the Past Six Months?: No ?Do You Follow a Special Diet?: No ?Do You Have Any Trouble Sleeping?: No ? ? ?CCA Employment/Education ?Employment/Work Situation: ?Employment / Work Situation ?Employment  Situation: Unemployed ?Patient's Job has Been Impacted by Current Illness: No ?Has Patient ever Been in the Military?: No ? ?Education: ?Education ?Is Patient Currently Attending School?: No ?Last Grade Compl

## 2021-11-25 NOTE — Progress Notes (Signed)
Patient ID: Joshua Shaffer., male   DOB: 30-Oct-1985, 36 y.o.   MRN: [id number] ?Admission note: ?Patient is a 36 year old male, presents voluntary per report of psychosis. Patient is alert and oriented to unit. Patients affect is anxious. Patient is cooperative during assessment questions. ?Patient identified stressors as unemployment and current relationship problems with wife and living situation. ?Patient states he felt like he was losing his mind and the severe anxiety never stopped even though he was compliant with his medication. ?Patient currently denies SI/HI/AVH. ?Unit policies explained and verbalized understanding. Q15 minute checks maintained and will continue to monitor.   ?

## 2021-11-26 DIAGNOSIS — F29 Unspecified psychosis not due to a substance or known physiological condition: Secondary | ICD-10-CM | POA: Diagnosis not present

## 2021-11-26 LAB — LIPID PANEL
Cholesterol: 132 mg/dL (ref 0–200)
HDL: 31 mg/dL — ABNORMAL LOW (ref >40)
LDL Cholesterol: 61 mg/dL (ref 0–99)
Total CHOL/HDL Ratio: 4.3 RATIO
Triglycerides: 199 mg/dL — ABNORMAL HIGH (ref <150)
VLDL: 40 mg/dL (ref 0–40)

## 2021-11-26 MED ORDER — PAROXETINE HCL 20 MG PO TABS
20.0000 mg | ORAL_TABLET | Freq: Every day | ORAL | Status: DC
  Administered 2021-11-26 – 2021-11-28 (×3): 20 mg via ORAL
  Filled 2021-11-26 (×3): qty 1

## 2021-11-26 MED ORDER — QUETIAPINE FUMARATE 100 MG PO TABS
100.0000 mg | ORAL_TABLET | Freq: Every day | ORAL | Status: DC
  Administered 2021-11-26 – 2021-11-27 (×2): 100 mg via ORAL
  Filled 2021-11-26 (×2): qty 1

## 2021-11-26 MED ORDER — CLONAZEPAM 0.5 MG PO TABS: 0.5000 mg | ORAL_TABLET | Freq: Two times a day (BID) | ORAL | Status: DC | PRN

## 2021-11-26 NOTE — Progress Notes (Signed)
Patient is quiet and reserved. He has been isolative to his room this evening.  Approached nurses station asking did he have any QHS medication. Had concerns that he would need something to help him sleep, but would try to get to sleep on his own first.  He denies si hi avh. He does endorse depression and anxiety at this encounter. Will continue to monitor with q15 minute safety checks and encourage him to seek staff with any concerns. ? ? ? ? ?C Butler-Nicholson, LPN ? ?

## 2021-11-26 NOTE — Plan of Care (Signed)
?  Problem: Education: Goal: Knowledge of [place] [facility] Education information/materials will improve Outcome: Progressing Goal: Emotional status will improve Outcome: Progressing Goal: Mental status will improve Outcome: Progressing Goal: Verbalization of understanding the information provided will improve Outcome: Progressing   Problem: Activity: Goal: Interest or engagement in activities will improve Outcome: Progressing Goal: Sleeping patterns will improve Outcome: Progressing   Problem: Coping: Goal: Ability to verbalize frustrations and anger appropriately will improve Outcome: Progressing Goal: Ability to demonstrate self-control will improve Outcome: Progressing   Problem: Health Behavior/Discharge Planning: Goal: Identification of resources available to assist in meeting health care needs will improve Outcome: Progressing Goal: Compliance with treatment plan for underlying cause of condition will improve Outcome: Progressing   Problem: Physical Regulation: Goal: Ability to maintain clinical measurements within normal limits will improve Outcome: Progressing   Problem: Safety: Goal: Periods of time without injury will increase Outcome: Progressing   Problem: Education: Goal: Ability to state activities that reduce stress will improve Outcome: Progressing   Problem: Coping: Goal: Ability to identify and develop effective coping behavior will improve Outcome: Progressing   Problem: Self-Concept: Goal: Ability to identify factors that promote anxiety will improve Outcome: Progressing Goal: Level of anxiety will decrease Outcome: Progressing Goal: Ability to modify response to factors that promote anxiety will improve Outcome: Progressing   

## 2021-11-26 NOTE — Group Note (Signed)
[facility] LCSW Group Therapy Note ? ? ?Group Date: 11/26/2021 ?Start Time: 1315 ?End Time: 1415 ? ? ?Type of Therapy/Topic:  Group Therapy:  Emotion Regulation ? ?Participation Level:  Did Not Attend  ? ?Mood: ? ?Description of Group:   ? The purpose of this group is to assist patients in learning to regulate negative emotions and experience positive emotions. Patients will be guided to discuss ways in which they have been vulnerable to their negative emotions. These vulnerabilities will be juxtaposed with experiences of positive emotions or situations, and patients challenged to use positive emotions to combat negative ones. Special emphasis will be placed on coping with negative emotions in conflict situations, and patients will process healthy conflict resolution skills. ? ?Therapeutic Goals: ?Patient will identify two positive emotions or experiences to reflect on in order to balance out negative emotions:  ?Patient will label two or more emotions that they find the most difficult to experience:  ?Patient will be able to demonstrate positive conflict resolution skills through discussion or role plays:  ? ?Summary of Patient Progress: ? ? ?X ? ? ? ?Therapeutic Modalities:   ?Cognitive Behavioral Therapy ?Feelings Identification ?Dialectical Behavioral Therapy ? ? ?Joshua Shaffer Joshua Mccannon, LCSW ?

## 2021-11-26 NOTE — H&P (Signed)
Psychiatric Admission Assessment Adult ? ?Patient Identification: Joshua Blissavid A Marcon Jr. ?MRN:  [id number] ?Date of Evaluation:  11/26/2021 ?Chief Complaint:  Psychosis (HCC) [F29] ?Principal Diagnosis: Psychosis (HCC) ?Diagnosis:  Principal Problem: ?  Psychosis (HCC) ?Active Problems: ?  Panic attacks ? ?History of Present Illness: Patient seen and chart reviewed.  36 year old man with a past history of chronic mental health issues presented to the emergency room a couple times recently complaining of panic attacks.  He described the panic attack to me as a sudden feeling of intense fear accompanied by a sense of shortness of breath and inability to breathe.  He will have a sense of unreality during these and feel like he needs to run or get out of his body.  These can go on for up to a few hours a day.  He has had these in the past but had had them under control in the past.  They restarted in the past couple months but seem to be more frequent now.  He says he has been taking the medication prescribed to him by his ACT team.  Denies any alcohol abuse.  Admits to no drug use currently.  Patient is living by himself in an apartment connected to his mother's home. ?Associated Signs/Symptoms: ?Depression Symptoms:  insomnia, ?anxiety, ?panic attacks, ?Duration of Depression Symptoms: Greater than two weeks ? ?(Hypo) Manic Symptoms:  Impulsivity, ?Anxiety Symptoms:  Panic Symptoms, ?Psychotic Symptoms:   Patient describes ideas of reference related to watching television and that other people around him at times seem to be able to read his mind or know what he is thinking. ?PTSD Symptoms: ?Negative ?Total Time spent with patient: 1 hour ? ?Past Psychiatric History: Patient has a history of symptoms with a mixture of anxiety and psychotic components.  Recently worsening symptoms that have been very distressing to him.  He had recently been on different medicines than he was when we had seen him in the past apparently  because he had complained to his provider that the medicines he got here made him feel sexually unresponsive and "dead". ? ?Is the patient at risk to self? No.  ?Has the patient been a risk to self in the past 6 months? No.  ?Has the patient been a risk to self within the distant past? No.  ?Is the patient a risk to others? No.  ?Has the patient been a risk to others in the past 6 months? No.  ?Has the patient been a risk to others within the distant past? No.  ? ?Prior Inpatient Therapy:   ?Prior Outpatient Therapy:   ? ?Alcohol Screening: Patient refused Alcohol Screening Tool: Yes ?1. How often do you have a drink containing alcohol?: Never ?2. How many drinks containing alcohol do you have on a typical day when you are drinking?: 1 or 2 ?3. How often do you have six or more drinks on one occasion?: Never ?AUDIT-C Score: 0 ?4. How often during the last year have you found that you were not able to stop drinking once you had started?: Never ?5. How often during the last year have you failed to do what was normally expected from you because of drinking?: Never ?6. How often during the last year have you needed a first drink in the morning to get yourself going after a heavy drinking session?: Never ?7. How often during the last year have you had a feeling of guilt of remorse after drinking?: Never ?8. How often during the last year  have you been unable to remember what happened the night before because you had been drinking?: Never ?9. Have you or someone else been injured as a result of your drinking?: No ?10. Has a relative or friend or a doctor or another health worker been concerned about your drinking or suggested you cut down?: No ?Alcohol Use Disorder Identification Test Final Score (AUDIT): 0 ?Substance Abuse History in the last 12 months:  No. ?Consequences of Substance Abuse: ?Negative ?Previous Psychotropic Medications: Yes  ?Psychological Evaluations: Yes  ?Past Medical History:  ?Past Medical History:   ?Diagnosis Date  ? Dizziness   ?  ?Past Surgical History:  ?Procedure Laterality Date  ? TONSILLECTOMY    ? ?Family History:  ?Family History  ?Problem Relation Age of Onset  ? Lung cancer Maternal Grandfather   ? ?Family Psychiatric  History: Patient has several close family members with psychotic illness ?Tobacco Screening:   ?Social History:  ?Social History  ? ?Substance and Sexual Activity  ?Alcohol Use No  ?   ?Social History  ? ?Substance and Sexual Activity  ?Drug Use Not Currently  ? Types: Marijuana  ?  ?Additional Social History: ?Marital status: Married ?What types of issues is patient dealing with in the relationship?: "she wants me to get a job" ?Additional relationship information: Pt reports that wife is renting a trailer, though he does not live with her.  He reports that she has the children with her. ?Does patient have children?: Yes ?How many children?: 3 ?How is patient's relationship with their children?: "good, my son, I don't know.  I am going to stay in his corner, I know my family has some mental health issues." ?   ?  ?  ?  ?  ?  ?  ?  ?  ?  ?  ? ?Allergies:  No Known Allergies ?Lab Results:  ?Results for orders placed or performed during the hospital encounter of 11/25/21 (from the past 48 hour(s))  ?Lipid panel     Status: Abnormal  ? Collection Time: 11/26/21  1:06 PM  ?Result Value Ref Range  ? Cholesterol 132 0 - 200 mg/dL  ? Triglycerides 199 (H) <150 mg/dL  ? HDL 31 (L) >40 mg/dL  ? Total CHOL/HDL Ratio 4.3 RATIO  ? VLDL 40 0 - 40 mg/dL  ? LDL Cholesterol 61 0 - 99 mg/dL  ?  Comment:        ?Total Cholesterol/HDL:CHD Risk ?Coronary Heart Disease Risk Table ?                    Men   Women ? 1/2 Average Risk   3.4   3.3 ? Average Risk       5.0   4.4 ? 2 X Average Risk   9.6   7.1 ? 3 X Average Risk  23.4   11.0 ?       ?Use the calculated Patient Ratio ?above and the CHD Risk Table ?to determine the patient's CHD Risk. ?       ?ATP III CLASSIFICATION (LDL): ? <100     mg/dL    Optimal ? 409-811  mg/dL   Near or Above ?                   Optimal ? 130-159  mg/dL   Borderline ? 160-189  mg/dL   High ? >914     mg/dL   Very High ?Performed at [facility], [address] [place], [place] [address] ?  ? ? ?Blood Alcohol level:  ?Lab Results  ?Component Value Date  ? ETH <10 11/24/2021  ? ETH <10 11/22/2021  ? ? ?Metabolic Disorder Labs:  ?Lab Results  ?Component Value Date  ? HGBA1C 5.4 09/04/2021  ? MPG 108.28 09/04/2021  ? MPG 111.15 12/14/2018  ? ?No results found for: PROLACTIN ?Lab Results  ?Component Value Date  ? CHOL 132 11/26/2021  ? TRIG 199 (H) 11/26/2021  ? HDL 31 (L) 11/26/2021  ? CHOLHDL 4.3 11/26/2021  ? VLDL 40 11/26/2021  ? LDLCALC 61 11/26/2021  ? LDLCALC 53 09/04/2021  ? ? ?Current Medications: ?Current Facility-Administered Medications  ?Medication Dose Route Frequency Provider Last Rate Last Admin  ? acetaminophen (TYLENOL) tablet 650 mg  650 mg Oral Q6H PRN Vanetta Mulders, NP      ? alum & mag hydroxide-simeth (MAALOX/MYLANTA) 200-200-20 MG/5ML suspension 30 mL  30 mL Oral Q4H PRN Vanetta Mulders, NP      ? clonazePAM (KLONOPIN) tablet 0.5 mg  0.5 mg Oral BID PRN Edwina Grossberg T, MD      ? magnesium hydroxide (MILK OF MAGNESIA) suspension 30 mL  30 mL Oral Daily PRN Vanetta Mulders, NP      ? PARoxetine (PAXIL) tablet 20 mg  20 mg Oral Daily Huston Stonehocker T, MD      ? QUEtiapine (SEROQUEL) tablet 100 mg  100 mg Oral QHS Khalen Styer, Jackquline Denmark, MD      ? ?PTA Medications: ?Medications Prior to Admission  ?Medication Sig Dispense Refill Last Dose  ? hydrOXYzine (ATARAX) 25 MG tablet Take 1 tablet (25 mg total) by mouth 3 (three) times daily as needed for anxiety. 45 tablet 0 Past Week  ? benztropine (COGENTIN) 1 MG tablet Take 1 tablet (1 mg total) by mouth daily. 30 tablet 0   ? FLUoxetine (PROZAC) 20 MG capsule Take 1 capsule (20 mg total) by mouth daily. 30 capsule 0   ? gabapentin (NEURONTIN) 100 MG capsule Take 1 capsule (100 mg total) by mouth 3 (three)  times daily. 90 capsule 0   ? haloperidol (HALDOL) 5 MG tablet Take 1 tablet (5 mg total) by mouth 2 (two) times daily. 60 tablet 0   ? mirtazapine (REMERON) 15 MG tablet Take 1 tablet (15 mg total) by mouth a

## 2021-11-26 NOTE — Progress Notes (Signed)
Recreation Therapy Notes ? ? ?Date: 11/26/2021 ? ?Time: 11:05 am  ? ?Location: Craft room  ? ?Behavioral response: N/A ? ?Intervention Topic: Stress Management  ? ?Discussion/Intervention: ?Patient refused to attend group. ? ?Clinical Observations/Feedback: ?Patient refused to attend group. ? ?Joshua Shaffer LRT/CTRS ? ? ? ? ? ? ? ?[name] ?11/26/2021 12:27 PM ?

## 2021-11-26 NOTE — [facility] Suicide Risk Assessment (Signed)
[facility] Admission Suicide Risk Assessment ? ? ?Nursing information obtained from:  Joshua Shaffer ?Demographic factors:  Male, Unemployed ?Current Mental Status:  NA ?Loss Factors:  Financial problems / change in socioeconomic status ?Historical Factors:  Family history of mental illness or substance abuse ?Risk Reduction Factors:  Positive social support ? ?Total Time spent with Joshua Shaffer: 1 hour ?Principal Problem: Psychosis (HCC) ?Diagnosis:  Principal Problem: ?  Psychosis (HCC) ?Active Problems: ?  Panic attacks ? ?Subjective Data: Joshua Shaffer seen and chart reviewed.  36 year old man with a history of psychosis and anxiety came to the emergency room repeatedly complaining of panic attacks symptoms.  In addition to panic attacks he is having paranoia and ideas of reference.  Denies suicidal ideation.  Did not do anything to try to hurt himself ? ?Continued Clinical Symptoms:  ?Alcohol Use Disorder Identification Test Final Score (AUDIT): 0 ?The "Alcohol Use Disorders Identification Test", Guidelines for Use in Primary Care, Second Edition.  World Science writer [facility]). ?Score between 0-7:  no or low risk or alcohol related problems. ?Score between 8-15:  moderate risk of alcohol related problems. ?Score between 16-19:  high risk of alcohol related problems. ?Score 20 or above:  warrants further diagnostic evaluation for alcohol dependence and treatment. ? ? ?CLINICAL FACTORS:  ? Severe Anxiety and/or Agitation ?Schizophrenia:   Paranoid or undifferentiated type ? ? ?Musculoskeletal: ?Strength & Muscle Tone: within normal limits ?Gait & Station: normal ?Joshua Shaffer leans: N/A ? ?Psychiatric Specialty Exam: ? ?Presentation  ?General Appearance: Appropriate for Environment ? ?Eye Contact:Good ? ?Speech:Clear and Coherent ? ?Speech Volume:Normal ? ?Handedness:Right ? ? ?Mood and Affect  ?Mood:Euthymic ? ?Affect:Depressed; Flat ? ? ?Thought Process  ?Thought Processes:Coherent ? ?Descriptions of  Associations:Intact ? ?Orientation:Full (Time, Place and Person) ? ?Thought Content:Logical ? ?History of Schizophrenia/Schizoaffective disorder:Yes ? ?Duration of Psychotic Symptoms:Greater than six months ? ?Hallucinations:Hallucinations: None ? ?Ideas of Reference:None ? ?Suicidal Thoughts:No data recorded ?Homicidal Thoughts:Homicidal Thoughts: No ? ? ?Sensorium  ?Memory:Immediate Fair; Recent Fair; Remote Fair ? ?Judgment:Fair ? ?Insight:Fair ? ? ?Executive Functions  ?Concentration:Fair ? ?Attention Span:Fair ? ?Recall:Fair ? ?Fund of Knowledge:Fair ? ?Language:Fair ? ? ?Psychomotor Activity  ?Psychomotor Activity:Psychomotor Activity: Normal ? ? ?Assets  ?Assets:Communication Skills; Desire for Improvement ? ? ?Sleep  ?Sleep:Sleep: Fair ?Number of Hours of Sleep: 4 ? ? ? ?Physical Exam: ?Physical Exam ?Vitals and nursing note reviewed.  ?Constitutional:   ?   Appearance: Normal appearance.  ?HENT:  ?   Head: Normocephalic and atraumatic.  ?   Mouth/Throat:  ?   Pharynx: Oropharynx is clear.  ?Eyes:  ?   Pupils: Pupils are equal, round, and reactive to light.  ?Cardiovascular:  ?   Rate and Rhythm: Normal rate and regular rhythm.  ?Pulmonary:  ?   Effort: Pulmonary effort is normal.  ?   Breath sounds: Normal breath sounds.  ?Abdominal:  ?   General: Abdomen is flat.  ?   Palpations: Abdomen is soft.  ?Musculoskeletal:     ?   General: Normal range of motion.  ?Skin: ?   General: Skin is warm and dry.  ?Neurological:  ?   General: No focal deficit present.  ?   Mental Status: He is alert. Mental status is at baseline.  ?Psychiatric:     ?   Attention and Perception: Attention normal.     ?   Mood and Affect: Mood is anxious.     ?   Speech: Speech normal.     ?   Behavior: Behavior  is cooperative.     ?   Thought Content: Thought content is paranoid and delusional. Thought content does not include homicidal or suicidal ideation.     ?   Cognition and Memory: Cognition normal.     ?   Judgment: Judgment is  impulsive.  ? ?Review of Systems  ?Constitutional: Negative.   ?HENT: Negative.    ?Eyes: Negative.   ?Respiratory: Negative.    ?Cardiovascular: Negative.   ?Gastrointestinal: Negative.   ?Musculoskeletal: Negative.   ?Skin: Negative.   ?Neurological: Negative.   ?Psychiatric/Behavioral:  Negative for depression, hallucinations, substance abuse and suicidal ideas. The Joshua Shaffer is nervous/anxious and has insomnia.   ?Blood pressure 118/87, pulse 63, temperature 98.3 ?F (36.8 ?C), temperature source Oral, resp. rate 18, height 5\' 11"  (1.803 m), weight 83.9 kg, SpO2 100 %. Body mass index is 25.8 kg/m?. ? ? ?COGNITIVE FEATURES THAT CONTRIBUTE TO RISK:  ?None   ? ?SUICIDE RISK:  ? Minimal: No identifiable suicidal ideation.  Patients presenting with no risk factors but with morbid ruminations; may be classified as minimal risk based on the severity of the depressive symptoms ? ?PLAN OF CARE: Restart medication for anxiety and psychosis.  Continue 15-minute checks.  Engage in appropriate therapy.  Reassess dangerousness prior to discharge ? ?I certify that inpatient services furnished can reasonably be expected to improve the Joshua Shaffer's condition.  ? ? , MD ?11/26/2021, 4:21 PM ? ?

## 2021-11-26 NOTE — Progress Notes (Signed)
D: Pt alert and oriented. Pt rates depression 5/10, hopelessness 0/10, and anxiety 2/10.  Pt reports sleep last night as being good. Pt denies experiencing any pain at this time. Pt denies experiencing any SI/HI, or AVH at this time.  ? ?A: Scheduled medications administered to pt, per MD orders. Support and encouragement provided. Frequent verbal contact made. Routine safety checks conducted q15 minutes.  ? ?R: No adverse drug reactions noted. Pt complaint with medications and treatment plan. Pt interacts well with others on the unit. Pt remains safe at this time. Will continue to monitor.    ?

## 2021-11-26 NOTE — [facility] Suicide Risk Assessment (Signed)
[facility] INPATIENT:  Family/Significant Other Suicide Prevention Education ? ?Suicide Prevention Education:  ?Patient Refusal for Family/Significant Other Suicide Prevention Education: The patient Joshua Shaffer. has refused to provide written consent for family/significant other to be provided Family/Significant Other Suicide Prevention Education during admission and/or prior to discharge.  Physician notified. ? ?SPE completed with pt, as pt refused to consent to family contact. SPI pamphlet provided to pt and pt was encouraged to share information with support network, ask questions, and talk about any concerns relating to SPE. Pt denies access to guns/firearms and verbalized understanding of information provided. Mobile Crisis information also provided to pt.  ? ?Harden Mo ?11/26/2021, 2:32 PM ?

## 2021-11-26 NOTE — BH IP Treatment Plan (Signed)
Interdisciplinary Treatment and Diagnostic Plan Update ? ?11/26/2021 ?Time of Session: 9:00am  ?Joshua Shaffer. ?MRN: [id number] ? ?Principal Diagnosis: Psychosis ([place]) ? ?Secondary Diagnoses: Principal Problem: ?  Psychosis ([place]) ? ? ?Current Medications:  ?Current Facility-Administered Medications  ?Medication Dose Route Frequency Provider Last Rate Last Admin  ? acetaminophen (TYLENOL) tablet 650 mg  650 mg Oral Q6H PRN Sherlon Handing, NP      ? alum & mag hydroxide-simeth (MAALOX/MYLANTA) 200-200-20 MG/5ML suspension 30 mL  30 mL Oral Q4H PRN Waldon Merl F, NP      ? magnesium hydroxide (MILK OF MAGNESIA) suspension 30 mL  30 mL Oral Daily PRN Sherlon Handing, NP      ? ?PTA Medications: ?Medications Prior to Admission  ?Medication Sig Dispense Refill Last Dose  ? hydrOXYzine (ATARAX) 25 MG tablet Take 1 tablet (25 mg total) by mouth 3 (three) times daily as needed for anxiety. 45 tablet 0 Past Week  ? benztropine (COGENTIN) 1 MG tablet Take 1 tablet (1 mg total) by mouth daily. 30 tablet 0   ? FLUoxetine (PROZAC) 20 MG capsule Take 1 capsule (20 mg total) by mouth daily. 30 capsule 0   ? gabapentin (NEURONTIN) 100 MG capsule Take 1 capsule (100 mg total) by mouth 3 (three) times daily. 90 capsule 0   ? haloperidol (HALDOL) 5 MG tablet Take 1 tablet (5 mg total) by mouth 2 (two) times daily. 60 tablet 0   ? mirtazapine (REMERON) 15 MG tablet Take 1 tablet (15 mg total) by mouth at bedtime. 30 tablet 0   ? PARoxetine (PAXIL) 10 MG tablet Take 10 mg by mouth daily. (Patient not taking: Reported on 11/25/2021)   Not Taking  ? QUEtiapine (SEROQUEL) 100 MG tablet Take 1 tablet (100 mg total) by mouth 2 (two) times daily. 60 tablet 0   ? traZODone (DESYREL) 50 MG tablet Take 25 mg by mouth at bedtime. (Patient not taking: Reported on 11/25/2021)   Not Taking  ? ? ?Patient Stressors: Medication change or noncompliance   ? ?Patient Strengths: Ability for insight  ?Motivation for treatment/growth  ? ?Treatment  Modalities: Medication Management, Group therapy, Case management,  ?1 to 1 session with clinician, Psychoeducation, Recreational therapy. ? ? ?Physician Treatment Plan for Primary Diagnosis: Psychosis ([place]) ?Long Term Goal(s):    ? ?Short Term Goals:   ? ?Medication Management: Evaluate patient's response, side effects, and tolerance of medication regimen. ? ?Therapeutic Interventions: 1 to 1 sessions, Unit Group sessions and Medication administration. ? ?Evaluation of Outcomes: Not Met ? ?Physician Treatment Plan for Secondary Diagnosis: Principal Problem: ?  Psychosis ([place]) ? ?Long Term Goal(s):    ? ?Short Term Goals:      ? ?Medication Management: Evaluate patient's response, side effects, and tolerance of medication regimen. ? ?Therapeutic Interventions: 1 to 1 sessions, Unit Group sessions and Medication administration. ? ?Evaluation of Outcomes: Not Met ? ? ?RN Treatment Plan for Primary Diagnosis: Psychosis ([place]) ?Long Term Goal(s): Knowledge of disease and therapeutic regimen to maintain health will improve ? ?Short Term Goals: Ability to verbalize frustration and anger appropriately will improve, Ability to demonstrate self-control, Ability to participate in decision making will improve, Ability to verbalize feelings will improve, Ability to disclose and discuss suicidal ideas, Ability to identify and develop effective coping behaviors will improve, and Compliance with prescribed medications will improve ? ?Medication Management: RN will administer medications as ordered by provider, will assess and evaluate patient's response and provide education to patient  for prescribed medication. RN will report any adverse and/or side effects to prescribing provider. ? ?Therapeutic Interventions: 1 on 1 counseling sessions, Psychoeducation, Medication administration, Evaluate responses to treatment, Monitor vital signs and CBGs as ordered, Perform/monitor CIWA, COWS, AIMS and Fall Risk screenings as ordered, Perform  wound care treatments as ordered. ? ?Evaluation of Outcomes: Not Met ? ? ?LCSW Treatment Plan for Primary Diagnosis: Psychosis ([place]) ?Long Term Goal(s): Safe transition to appropriate next level of care at discharge, Engage patient in therapeutic group addressing interpersonal concerns. ? ?Short Term Goals: Engage patient in aftercare planning with referrals and resources, Increase social support, Increase ability to appropriately verbalize feelings, Increase emotional regulation, Facilitate patient progression through stages of change regarding substance use diagnoses and concerns, Identify triggers associated with mental health/substance abuse issues, and Increase skills for wellness and recovery ? ?Therapeutic Interventions: Assess for all discharge needs, 1 to 1 time with Education officer, museum, Explore available resources and support systems, Assess for adequacy in community support network, Educate family and significant other(s) on suicide prevention, Complete Psychosocial Assessment, Interpersonal group therapy. ? ?Evaluation of Outcomes: Not Met ? ? ?Progress in Treatment: ?Attending groups: No. ?Participating in groups: No. ?Taking medication as prescribed: Yes. ?Toleration medication: Yes. ?Family/Significant other contact made: No, will contact:  patient declined collateral contact ?Patient understands diagnosis: Yes. ?Discussing patient identified problems/goals with staff: Yes. ?Medical problems stabilized or resolved: Yes. ?Denies suicidal/homicidal ideation: Yes. ?Issues/concerns per patient self-inventory: Yes. ?Other: n/a ? ?New problem(s) identified: Yes, Describe:  patient stated his anxiety has been getting worse. ? ?New Short Term/Long Term Goal(s): Patient stated "Stop having panic attacks" ? ?Patient Goals:  Patient stated "Stop having panic attacks" ? ?Discharge Plan or Barriers: none ? ?Reason for Continuation of Hospitalization: Anxiety ?Mania ?Medication stabilization ? ?Estimated Length of Stay:  1-7 Days ? ? ?Scribe for Treatment Team: ?Janet Berlin Work ?11/26/2021 ?11:26 AM ?

## 2021-11-26 NOTE — Plan of Care (Signed)
?  Problem: Education: ?Goal: Knowledge of [facility] Education information/materials will improve ?Outcome: Progressing ?Goal: Emotional status will improve ?Outcome: Progressing ?Goal: Mental status will improve ?Outcome: Progressing ?Goal: Verbalization of understanding the information provided will improve ?Outcome: Progressing ?  ?Problem: Activity: ?Goal: Interest or engagement in activities will improve ?Outcome: Progressing ?Goal: Sleeping patterns will improve ?Outcome: Progressing ?  ?Problem: Coping: ?Goal: Ability to verbalize frustrations and anger appropriately will improve ?Outcome: Progressing ?Goal: Ability to demonstrate self-control will improve ?Outcome: Progressing ?  ?Problem: Self-Concept: ?Goal: Ability to identify factors that promote anxiety will improve ?Outcome: Progressing ?Goal: Level of anxiety will decrease ?Outcome: Progressing ?Goal: Ability to modify response to factors that promote anxiety will improve ?Outcome: Progressing ?  ?Problem: Coping: ?Goal: Ability to identify and develop effective coping behavior will improve ?Outcome: Progressing ?  ?Problem: Education: ?Goal: Ability to state activities that reduce stress will improve ?Outcome: Progressing ?  ?
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
## 2021-11-27 ENCOUNTER — Other Ambulatory Visit: Payer: Self-pay

## 2021-11-27 DIAGNOSIS — F29 Unspecified psychosis not due to a substance or known physiological condition: Secondary | ICD-10-CM | POA: Diagnosis not present

## 2021-11-27 LAB — HEMOGLOBIN A1C
Hgb A1c MFr Bld: 5.4 % (ref 4.8–5.6)
Mean Plasma Glucose: 108 mg/dL

## 2021-11-27 MED ORDER — QUETIAPINE FUMARATE 100 MG PO TABS
100.0000 mg | ORAL_TABLET | Freq: Every day | ORAL | 0 refills | Status: DC
  Filled 2021-11-27: qty 10, 10d supply, fill #0

## 2021-11-27 MED ORDER — PAROXETINE HCL 20 MG PO TABS
20.0000 mg | ORAL_TABLET | Freq: Every day | ORAL | 0 refills | Status: DC
  Filled 2021-11-27: qty 10, 10d supply, fill #0

## 2021-11-27 NOTE — Progress Notes (Signed)
Patient compliant with unit rules interacting well with Peers and Staff denies SI/HI/A/VH at present. ? ?A Scheduled medications administered per Provider order. Support and encouragement provided. Routine safety checks conducted Pt remains safe. Patient notified to inform staff with problems or concerns. ? ?R. No adverse drug reactions noted. Patient contracts for safety at this time. Will continue to monitor.   ?

## 2021-11-27 NOTE — Group Note (Signed)
[facility] LCSW Group Therapy Note ? ? ?Group Date: 11/27/2021 ?Start Time: 1300 ?End Time: 1400 ? ? ?Type of Therapy/Topic:  Group Therapy:  Balance in Life ? ?Participation Level:  Did Not Attend  ? ?Description of Group:   ? This group will address the concept of balance and how it feels and looks when one is unbalanced. Patients will be encouraged to process areas in their lives that are out of balance, and identify reasons for remaining unbalanced. Facilitators will guide patients utilizing problem- solving interventions to address and correct the stressor making their life unbalanced. Understanding and applying boundaries will be explored and addressed for obtaining  and maintaining a balanced life. Patients will be encouraged to explore ways to assertively make their unbalanced needs known to significant others in their lives, using other group members and facilitator for support and feedback. ? ?Therapeutic Goals: ?Patient will identify two or more emotions or situations they have that consume much of in their lives. ?Patient will identify signs/triggers that life has become out of balance:  ?Patient will identify two ways to set boundaries in order to achieve balance in their lives:  ?Patient will demonstrate ability to communicate their needs through discussion and/or role plays ? ?Summary of Patient Progress: ?Patient did not attend group despite encouraged participation.  ? ? ? ?Therapeutic Modalities:   ?Cognitive Behavioral Therapy ?Solution-Focused Therapy ?Assertiveness Training ? ? ?Axavier Pressley W Jessenia Filippone, LCSWA ?

## 2021-11-27 NOTE — Plan of Care (Signed)
?  Problem: Education: Goal: Knowledge of [facility] Education information/materials will improve Outcome: Progressing Goal: Emotional status will improve Outcome: Progressing Goal: Mental status will improve Outcome: Progressing Goal: Verbalization of understanding the information provided will improve Outcome: Progressing   Problem: Activity: Goal: Interest or engagement in activities will improve Outcome: Progressing Goal: Sleeping patterns will improve Outcome: Progressing   Problem: Coping: Goal: Ability to verbalize frustrations and anger appropriately will improve Outcome: Progressing Goal: Ability to demonstrate self-control will improve Outcome: Progressing   Problem: Health Behavior/Discharge Planning: Goal: Identification of resources available to assist in meeting health care needs will improve Outcome: Progressing Goal: Compliance with treatment plan for underlying cause of condition will improve Outcome: Progressing   Problem: Physical Regulation: Goal: Ability to maintain clinical measurements within normal limits will improve Outcome: Progressing   Problem: Safety: Goal: Periods of time without injury will increase Outcome: Progressing   Problem: Education: Goal: Ability to state activities that reduce stress will improve Outcome: Progressing   Problem: Coping: Goal: Ability to identify and develop effective coping behavior will improve Outcome: Progressing   Problem: Self-Concept: Goal: Ability to identify factors that promote anxiety will improve Outcome: Progressing Goal: Level of anxiety will decrease Outcome: Progressing Goal: Ability to modify response to factors that promote anxiety will improve Outcome: Progressing   

## 2021-11-27 NOTE — Progress Notes (Signed)
Pt alert and oriented. Pt rates depression 5/10, hopelessness 0/10, and anxiety 2/10.  Pt reports sleep last night as being good. Pt denies experiencing any pain at this time. Pt denies experiencing any SI/HI, or AVH at this time.  ?  ?A: Scheduled medications administered to pt, per MD orders. Support and encouragement provided. Frequent verbal contact made. Routine safety checks conducted q15 minutes.  ?  ?R: No adverse drug reactions noted. Pt complaint with medications and treatment plan. Pt interacts well with others on the unit. Pt remains safe at this time. Will continue to monitor.    ?

## 2021-11-27 NOTE — Progress Notes (Signed)
[facility] MD Progress Note ? ?11/27/2021 3:43 PM ?Joshua Shaffer.  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  Patient has no new complaint.  Has been in bed much of the day but is easily awakened more.  Says he is not having any anxiety attacks and is feeling more calm and relaxed.  Denies any psychotic symptoms.  Tolerating medicine well.  No new physical complaints. ?Principal Problem: Psychosis (HCC) ?Diagnosis: Principal Problem: ?  Psychosis (HCC) ?Active Problems: ?  Panic attacks ? ?Total Time spent with patient: 30 minutes ? ?Past Psychiatric History: Past history of psychotic disorder and anxiety disorder ? ?Past Medical History:  ?Past Medical History:  ?Diagnosis Date  ? Dizziness   ?  ?Past Surgical History:  ?Procedure Laterality Date  ? TONSILLECTOMY    ? ?Family History:  ?Family History  ?Problem Relation Age of Onset  ? Lung cancer Maternal Grandfather   ? ?Family Psychiatric  History: Multiple family members with mental health problems ?Social History:  ?Social History  ? ?Substance and Sexual Activity  ?Alcohol Use No  ?   ?Social History  ? ?Substance and Sexual Activity  ?Drug Use Not Currently  ? Types: Marijuana  ?  ?Social History  ? ?Socioeconomic History  ? Marital status: Married  ?  Spouse name: Not on file  ? Number of children: Not on file  ? Years of education: Not on file  ? Highest education level: Not on file  ?Occupational History  ? Not on file  ?Tobacco Use  ? Smoking status: Every Day  ?  Packs/day: 0.50  ?  Years: 14.00  ?  Pack years: 7.00  ?  Types: Cigarettes  ? Smokeless tobacco: Former  ?  Types: Chew  ? Tobacco comments:  ?  Pt refused counseling  ?Vaping Use  ? Vaping Use: Never used  ?Substance and Sexual Activity  ? Alcohol use: No  ? Drug use: Not Currently  ?  Types: Marijuana  ? Sexual activity: Yes  ?  Comment: pt married  ?Other Topics Concern  ? Not on file  ?Social History Narrative  ? Not on file  ? ?Social  Determinants of Health  ? ?Financial Resource Strain: Not on file  ?Food Insecurity: Not on file  ?Transportation Needs: Not on file  ?Physical Activity: Not on file  ?Stress: Not on file  ?Social Connections: Not on file  ? ?Additional Social History:  ?  ?  ?  ?  ?  ?  ?  ?  ?  ?  ?  ? ?Sleep: Fair ? ?Appetite:  Fair ? ?Current Medications: ?Current Facility-Administered Medications  ?Medication Dose Route Frequency Provider Last Rate Last Admin  ? acetaminophen (TYLENOL) tablet 650 mg  650 mg Oral Q6H PRN Vanetta Mulders, NP      ? alum & mag hydroxide-simeth (MAALOX/MYLANTA) 200-200-20 MG/5ML suspension 30 mL  30 mL Oral Q4H PRN Vanetta Mulders, NP      ? clonazePAM (KLONOPIN) tablet 0.5 mg  0.5 mg Oral BID PRN Quinto Tippy T, MD      ? magnesium hydroxide (MILK OF MAGNESIA) suspension 30 mL  30 mL Oral Daily PRN Vanetta Mulders, NP      ? PARoxetine (PAXIL) tablet 20 mg  20 mg Oral Daily Emina Ribaudo T, MD   20 mg at 11/27/21 0900  ? QUEtiapine (SEROQUEL) tablet 100 mg  100 mg Oral QHS Annaly Skop, Jackquline Denmark, MD  100 mg at 11/26/21 2128  ? ? ?Lab Results:  ?Results for orders placed or performed during the hospital encounter of 11/25/21 (from the past 48 hour(s))  ?Hemoglobin A1c     Status: None  ? Collection Time: 11/26/21  1:06 PM  ?Result Value Ref Range  ? Hgb A1c MFr Bld 5.4 4.8 - 5.6 %  ?  Comment: (NOTE) ?        Prediabetes: 5.7 - 6.4 ?        Diabetes: >6.4 ?        Glycemic control for adults with diabetes: <7.0 ?  ? Mean Plasma Glucose 108 mg/dL  ?  Comment: (NOTE) ?Performed At: [facility] ?[address] [place], [place] [id number] ?Jolene SchimkeNagendra Sanjai MD ZH:[id number]:[phone] ?  ?Lipid panel     Status: Abnormal  ? Collection Time: 11/26/21  1:06 PM  ?Result Value Ref Range  ? Cholesterol 132 0 - 200 mg/dL  ? Triglycerides 199 (H) <150 mg/dL  ? HDL 31 (L) >40 mg/dL  ? Total CHOL/HDL Ratio 4.3 RATIO  ? VLDL 40 0 - 40 mg/dL  ? LDL Cholesterol 61 0 - 99 mg/dL  ?  Comment:        ?Total  Cholesterol/HDL:CHD Risk ?Coronary Heart Disease Risk Table ?                    Men   Women ? 1/2 Average Risk   3.4   3.3 ? Average Risk       5.0   4.4 ? 2 X Average Risk   9.6   7.1 ? 3 X Average Risk  23.4   11.0 ?       ?Use the calculated Patient Ratio ?above and the CHD Risk Table ?to determine the patient's CHD Risk. ?       ?ATP III CLASSIFICATION (LDL): ? <100     mg/dL   Optimal ? 295-284100-129  mg/dL   Near or Above ?                   Optimal ? 130-159  mg/dL   Borderline ? 160-189  mg/dL   High ? >132>190     mg/dL   Very High ?Performed at [facility], [address] [place], [place] [address] ?  ? ? ?Blood Alcohol level:  ?Lab Results  ?Component Value Date  ? ETH <10 11/24/2021  ? ETH <10 11/22/2021  ? ? ?Metabolic Disorder Labs: ?Lab Results  ?Component Value Date  ? HGBA1C 5.4 11/26/2021  ? MPG 108 11/26/2021  ? MPG 108.28 09/04/2021  ? ?No results found for: PROLACTIN ?Lab Results  ?Component Value Date  ? CHOL 132 11/26/2021  ? TRIG 199 (H) 11/26/2021  ? HDL 31 (L) 11/26/2021  ? CHOLHDL 4.3 11/26/2021  ? VLDL 40 11/26/2021  ? LDLCALC 61 11/26/2021  ? LDLCALC 53 09/04/2021  ? ? ?Physical Findings: ?AIMS:  , ,  ,  ,    ?CIWA:    ?COWS:    ? ?Musculoskeletal: ?Strength & Muscle Tone: within normal limits ?Gait & Station: normal ?Patient leans: N/A ? ?Psychiatric Specialty Exam: ? ?Presentation  ?General Appearance: Appropriate for Environment ? ?Eye Contact:Good ? ?Speech:Clear and Coherent ? ?Speech Volume:Normal ? ?Handedness:Right ? ? ?Mood and Affect  ?Mood:Euthymic ? ?Affect:Depressed; Flat ? ? ?Thought Process  ?Thought Processes:Coherent ? ?Descriptions of Associations:Intact ? ?Orientation:Full (Time, Place and Person) ? ?Thought Content:Logical ? ?History of Schizophrenia/Schizoaffective disorder:Yes ? ?  Duration of Psychotic Symptoms:Greater than six months ? ?Hallucinations:No data recorded ?Ideas of Reference:None ? ?Suicidal Thoughts:No data recorded ?Homicidal Thoughts:No data  recorded ? ?Sensorium  ?Memory:Immediate Fair; Recent Fair; Remote Fair ? ?Judgment:Fair ? ?Insight:Fair ? ? ?Executive Functions  ?Concentration:Fair ? ?Attention Span:Fair ? ?Recall:Fair ? ?Fund of Knowledge:Fair ? ?Language:Fair ? ? ?Psychomotor Activity  ?Psychomotor Activity:No data recorded ? ?Assets  ?Assets:Communication Skills; Desire for Improvement ? ? ?Sleep  ?Sleep:No data recorded ? ? ?Physical Exam: ?Physical Exam ?Vitals and nursing note reviewed.  ?Constitutional:   ?   Appearance: Normal appearance.  ?HENT:  ?   Head: Normocephalic and atraumatic.  ?   Mouth/Throat:  ?   Pharynx: Oropharynx is clear.  ?Eyes:  ?   Pupils: Pupils are equal, round, and reactive to light.  ?Cardiovascular:  ?   Rate and Rhythm: Normal rate and regular rhythm.  ?Pulmonary:  ?   Effort: Pulmonary effort is normal.  ?   Breath sounds: Normal breath sounds.  ?Abdominal:  ?   General: Abdomen is flat.  ?   Palpations: Abdomen is soft.  ?Musculoskeletal:     ?   General: Normal range of motion.  ?Skin: ?   General: Skin is warm and dry.  ?Neurological:  ?   General: No focal deficit present.  ?   Mental Status: He is alert. Mental status is at baseline.  ?Psychiatric:     ?   Mood and Affect: Mood normal.     ?   Thought Content: Thought content normal.  ? ?Review of Systems  ?Constitutional: Negative.   ?HENT: Negative.    ?Eyes: Negative.   ?Respiratory: Negative.    ?Cardiovascular: Negative.   ?Gastrointestinal: Negative.   ?Musculoskeletal: Negative.   ?Skin: Negative.   ?Neurological: Negative.   ?Psychiatric/Behavioral: Negative.    ?Blood pressure (!) 123/96, pulse 67, temperature 98.3 ?F (36.8 ?C), temperature source Oral, resp. rate 17, height 5\' 11"  (1.803 m), weight 83.9 kg, SpO2 98 %. Body mass index is 25.8 kg/m?. ? ? ?Treatment Plan Summary: ?Medication management and Plan arrangements will be made for discharge tomorrow including a 10-day supply of medicine.  He will follow up with his usual ACT team.   Patient agrees to plan. ? ? , MD ?11/27/2021, 3:43 PM ? ?

## 2021-11-27 NOTE — Progress Notes (Signed)
Recreation Therapy Notes ? ?Date: 11/27/2021 ? ?Time: 10:35 am   ? ?Location: Courtyard      ? ?Behavioral response: N/A ?  ?Intervention Topic: Leisure   ? ?Discussion/Intervention: ?Patient refused to attend group.  ? ?Clinical Observations/Feedback:  ?Patient refused to attend group.  ?  ?Lashea Goda LRT/CTRS ? ? ? ? ? ? ? ? ? ?Erryn Dickison ?11/27/2021 12:46 PM ?

## 2021-11-28 DIAGNOSIS — F29 Unspecified psychosis not due to a substance or known physiological condition: Secondary | ICD-10-CM | POA: Diagnosis not present

## 2021-11-28 MED ORDER — GABAPENTIN 100 MG PO CAPS: 100.0000 mg | ORAL_CAPSULE | Freq: Three times a day (TID) | ORAL | 1 refills | Status: AC

## 2021-11-28 MED ORDER — QUETIAPINE FUMARATE 100 MG PO TABS: 100.0000 mg | ORAL_TABLET | Freq: Every day | ORAL | 1 refills | Status: AC

## 2021-11-28 MED ORDER — PAROXETINE HCL 20 MG PO TABS: 20.0000 mg | ORAL_TABLET | Freq: Every day | ORAL | 1 refills | Status: AC

## 2021-11-28 MED ORDER — CLONAZEPAM 0.5 MG PO TABS: 0.5000 mg | ORAL_TABLET | Freq: Two times a day (BID) | ORAL | 0 refills | Status: AC | PRN

## 2021-11-28 NOTE — Progress Notes (Signed)
D: Pt alert and oriented. Pt denies experiencing any pain, SI/HI, or AVH at this time. Pt reports he will be able to keep himself safe when he return's home. ?  ?A: Pt received discharge and medication education/information. Pt belongings were returned and signed for at this time to include printed prescriptions and valuables.  ?  ?R: Pt verbalized understanding of discharge and medication education/information. ?  ?Pt escorted by staff to the medical mall front lobby where safe transport picked pt up and transported home.  ?

## 2021-11-28 NOTE — [facility] Suicide Risk Assessment (Signed)
[facility] Discharge Suicide Risk Assessment ? ? ?Principal Problem: Psychosis (HCC) ?Discharge Diagnoses: Principal Problem: ?  Psychosis (HCC) ?Active Problems: ?  Panic attacks ? ? ?Total Time spent with patient: 30 minutes ? ?Musculoskeletal: ?Strength & Muscle Tone: within normal limits ?Gait & Station: normal ?Patient leans: N/A ? ?Psychiatric Specialty Exam ? ?Presentation  ?General Appearance: Appropriate for Environment ? ?Eye Contact:Good ? ?Speech:Clear and Coherent ? ?Speech Volume:Normal ? ?Handedness:Right ? ? ?Mood and Affect  ?Mood:Euthymic ? ?Duration of Depression Symptoms: Greater than two weeks ? ?Affect:Depressed; Flat ? ? ?Thought Process  ?Thought Processes:Coherent ? ?Descriptions of Associations:Intact ? ?Orientation:Full (Time, Place and Person) ? ?Thought Content:Logical ? ?History of Schizophrenia/Schizoaffective disorder:Yes ? ?Duration of Psychotic Symptoms:Greater than six months ? ?Hallucinations:No data recorded ?Ideas of Reference:None ? ?Suicidal Thoughts:No data recorded ?Homicidal Thoughts:No data recorded ? ?Sensorium  ?Memory:Immediate Fair; Recent Fair; Remote Fair ? ?Judgment:Fair ? ?Insight:Fair ? ? ?Executive Functions  ?Concentration:Fair ? ?Attention Span:Fair ? ?Recall:Fair ? ?Fund of Knowledge:Fair ? ?Language:Fair ? ? ?Psychomotor Activity  ?Psychomotor Activity:No data recorded ? ?Assets  ?Assets:Communication Skills; Desire for Improvement ? ? ?Sleep  ?Sleep:No data recorded ? ?Physical Exam: ?Physical Exam ?Vitals and nursing note reviewed.  ?Constitutional:   ?   Appearance: Normal appearance.  ?HENT:  ?   Head: Normocephalic and atraumatic.  ?   Mouth/Throat:  ?   Pharynx: Oropharynx is clear.  ?Eyes:  ?   Pupils: Pupils are equal, round, and reactive to light.  ?Cardiovascular:  ?   Rate and Rhythm: Normal rate and regular rhythm.  ?Pulmonary:  ?   Effort: Pulmonary effort is normal.  ?   Breath sounds: Normal breath sounds.  ?Abdominal:  ?   General: Abdomen is flat.   ?   Palpations: Abdomen is soft.  ?Musculoskeletal:     ?   General: Normal range of motion.  ?Skin: ?   General: Skin is warm and dry.  ?Neurological:  ?   General: No focal deficit present.  ?   Mental Status: He is alert. Mental status is at baseline.  ?Psychiatric:     ?   Mood and Affect: Mood normal.     ?   Thought Content: Thought content normal.  ? ?Review of Systems  ?Constitutional: Negative.   ?HENT: Negative.    ?Eyes: Negative.   ?Respiratory: Negative.    ?Cardiovascular: Negative.   ?Gastrointestinal: Negative.   ?Musculoskeletal: Negative.   ?Skin: Negative.   ?Neurological: Negative.   ?Psychiatric/Behavioral: Negative.    ?Blood pressure (!) 122/96, pulse 69, temperature 98 ?F (36.7 ?C), temperature source Oral, resp. rate 17, height 5\' 11"  (1.803 m), weight 83.9 kg, SpO2 94 %. Body mass index is 25.8 kg/m?. ? ?Mental Status Per Nursing Assessment::   ?On Admission:  NA ? ?Demographic Factors:  ?Male and Low socioeconomic status ? ?Loss Factors: ?NA ? ?Historical Factors: ?NA ? ?Risk Reduction Factors:   ?Sense of responsibility to family, Living with another person, especially a relative, Positive social support, and Positive therapeutic relationship ? ?Continued Clinical Symptoms:  ?Severe Anxiety and/or Agitation ?Schizophrenia:   Paranoid or undifferentiated type ? ?Cognitive Features That Contribute To Risk:  ?None   ? ?Suicide Risk:  ?Minimal: No identifiable suicidal ideation.  Patients presenting with no risk factors but with morbid ruminations; may be classified as minimal risk based on the severity of the depressive symptoms ? ? Follow-up Information   ? ? [facility] [phone] & [employer], [place]. Follow up.   ?  Why: Your follow up appointment with the physician is scheduled for 12/03/2021 at 11. ?Contact information: ?[address] ?[address] ?[place] [address] ?[phone] ? ? ?  ?  ? ?  ?  ? ?  ? ? ?Plan Of Care/Follow-up recommendations:  ?Patient will be discharged back to his  own stable living environment with ACT team support.  Prescriptions will be sent to the preferred pharmacy at the request of the ACT team.  Patient reports symptoms much improved general mood improved denies any current suicidal or homicidal ideation.  Request was made for supply of medicine at discharge. ? ?Mordecai Rasmussen, MD ?11/28/2021, 1:23 PM ?

## 2021-11-28 NOTE — Plan of Care (Signed)
?  Problem: Education: ?Goal: Knowledge of [facility] Education information/materials will improve ?Outcome: Progressing ?Goal: Emotional status will improve ?Outcome: Progressing ?Goal: Mental status will improve ?Outcome: Progressing ?Goal: Verbalization of understanding the information provided will improve ?Outcome: Progressing ?  ?Problem: Activity: ?Goal: Interest or engagement in activities will improve ?Outcome: Progressing ?Goal: Sleeping patterns will improve ?Outcome: Progressing ?  ?Problem: Coping: ?Goal: Ability to verbalize frustrations and anger appropriately will improve ?Outcome: Progressing ?Goal: Ability to demonstrate self-control will improve ?Outcome: Progressing ?  ?Problem: Self-Concept: ?Goal: Ability to identify factors that promote anxiety will improve ?Outcome: Progressing ?Goal: Level of anxiety will decrease ?Outcome: Progressing ?Goal: Ability to modify response to factors that promote anxiety will improve ?Outcome: Progressing ?  ?Problem: Coping: ?Goal: Ability to identify and develop effective coping behavior will improve ?Outcome: Progressing ?  ?

## 2021-11-28 NOTE — Progress Notes (Signed)
?  [facility] Adult Case Management Discharge Plan : ? ?Will you be returning to the same living situation after discharge:  Yes,  pt reports that he is returning home ?At discharge, do you have transportation home?: Yes,  CSW will assist with transportation needs ?Do you have the ability to pay for your medications: No. ? ?Release of information consent forms completed and in the chart;  Patient's signature needed at discharge. ? ?Patient to Follow up at: ? Follow-up Information   ? ? [facility] Follow up.   ?Why: Your follow up appointment with the physician is scheduled for 12/03/2021 at 26. ?Contact information: ?[address] ?[address] ?[place] [address] ?[phone] ? ? ?  ?  ? ?  ?  ? ?  ? ? ?Next level of care provider has access to [facility] ? ?Safety Planning and Suicide Prevention discussed: Yes,  SPE completed with the patient, pt declined collateral contact ? ?  ? ?Has patient been referred to the Quitline?: Patient refused referral ? ?Patient has been referred for addiction treatment: Pt. refused referral ? ?Rozann Lesches, LCSW ?11/28/2021, 2:08 PM ?

## 2021-11-28 NOTE — Plan of Care (Signed)
?  Problem: Education: ?Goal: Knowledge of [facility] Education information/materials will improve ?Outcome: Adequate for Discharge ?Goal: Emotional status will improve ?Outcome: Adequate for Discharge ?Goal: Mental status will improve ?Outcome: Adequate for Discharge ?Goal: Verbalization of understanding the information provided will improve ?Outcome: Adequate for Discharge ?  ?Problem: Activity: ?Goal: Interest or engagement in activities will improve ?Outcome: Adequate for Discharge ?Goal: Sleeping patterns will improve ?Outcome: Adequate for Discharge ?  ?Problem: Coping: ?Goal: Ability to verbalize frustrations and anger appropriately will improve ?Outcome: Adequate for Discharge ?Goal: Ability to demonstrate self-control will improve ?Outcome: Adequate for Discharge ?  ?Problem: Health Behavior/Discharge Planning: ?Goal: Identification of resources available to assist in meeting health care needs will improve ?Outcome: Adequate for Discharge ?Goal: Compliance with treatment plan for underlying cause of condition will improve ?Outcome: Adequate for Discharge ?  ?Problem: Physical Regulation: ?Goal: Ability to maintain clinical measurements within normal limits will improve ?Outcome: Adequate for Discharge ?  ?Problem: Safety: ?Goal: Periods of time without injury will increase ?Outcome: Adequate for Discharge ?  ?Problem: Education: ?Goal: Ability to state activities that reduce stress will improve ?Outcome: Adequate for Discharge ?  ?Problem: Coping: ?Goal: Ability to identify and develop effective coping behavior will improve ?Outcome: Adequate for Discharge ?  ?Problem: Self-Concept: ?Goal: Ability to identify factors that promote anxiety will improve ?Outcome: Adequate for Discharge ?Goal: Level of anxiety will decrease ?Outcome: Adequate for Discharge ?Goal: Ability to modify response to factors that promote anxiety will improve ?Outcome: Adequate for Discharge ?  ?

## 2021-11-28 NOTE — Discharge Summary (Signed)
Physician Discharge Summary Note ? ?Patient:  Joshua Shaffer. is an 36 y.o., male ?MRN:  [id number] ?DOB:  1986-04-26 ?Patient phone:  [phone] (home)  ?Patient address:   ?[address] ?[place] [address],  ?Total Time spent with patient: 30 minutes ? ?Date of Admission:  11/25/2021 ?Date of Discharge: 11/28/2021 ? ?Reason for Admission: Patient presented with complaints of worsening panic attacks accompanied by ideas of reference and psychotic thinking. ? ?Principal Problem: Psychosis (HCC) ?Discharge Diagnoses: Principal Problem: ?  Psychosis (HCC) ?Active Problems: ?  Panic attacks ? ? ?Past Psychiatric History: Past history of psychotic disorder as well as chronic anxiety issues ? ?Past Medical History:  ?Past Medical History:  ?Diagnosis Date  ? Dizziness   ?  ?Past Surgical History:  ?Procedure Laterality Date  ? TONSILLECTOMY    ? ?Family History:  ?Family History  ?Problem Relation Age of Onset  ? Lung cancer Maternal Grandfather   ? ?Family Psychiatric  History: See previous.  Several family members with psychotic disorders ?Social History:  ?Social History  ? ?Substance and Sexual Activity  ?Alcohol Use No  ?   ?Social History  ? ?Substance and Sexual Activity  ?Drug Use Not Currently  ? Types: Marijuana  ?  ?Social History  ? ?Socioeconomic History  ? Marital status: Married  ?  Spouse name: Not on file  ? Number of children: Not on file  ? Years of education: Not on file  ? Highest education level: Not on file  ?Occupational History  ? Not on file  ?Tobacco Use  ? Smoking status: Every Day  ?  Packs/day: 0.50  ?  Years: 14.00  ?  Pack years: 7.00  ?  Types: Cigarettes  ? Smokeless tobacco: Former  ?  Types: Chew  ? Tobacco comments:  ?  Pt refused counseling  ?Vaping Use  ? Vaping Use: Never used  ?Substance and Sexual Activity  ? Alcohol use: No  ? Drug use: Not Currently  ?  Types: Marijuana  ? Sexual activity: Yes  ?  Comment: pt married  ?Other Topics Concern  ? Not on file  ?Social History  Narrative  ? Not on file  ? ?Social Determinants of Health  ? ?Financial Resource Strain: Not on file  ?Food Insecurity: Not on file  ?Transportation Needs: Not on file  ?Physical Activity: Not on file  ?Stress: Not on file  ?Social Connections: Not on file  ? ? ?Hospital Course: Patient admitted to psychiatric unit.  15-minute checks continued.  Patient interacted appropriately with staff including treatment team.  Medications were adjusted to Paxil and Seroquel which had been previously helpful.  Patient tolerated medicine well without any complaint.  At the time of discharge he will be returning to his home and will follow up with the ACT team.  Prescriptions will be sent to the preferred pharmacy of the ACT team at their request.  Supportive counseling and therapy provided.  Review of medications with patient.  At the time of discharge no sign of dangerousness. ? ?Physical Findings: ?AIMS:  , ,  ,  ,    ?CIWA:    ?COWS:    ? ?Musculoskeletal: ?Strength & Muscle Tone: within normal limits ?Gait & Station: normal ?Patient leans: N/A ? ? ?Psychiatric Specialty Exam: ? ?Presentation  ?General Appearance: Appropriate for Environment ? ?Eye Contact:Good ? ?Speech:Clear and Coherent ? ?Speech Volume:Normal ? ?Handedness:Right ? ? ?Mood and Affect  ?Mood:Euthymic ? ?Affect:Depressed; Flat ? ? ?Thought Process  ?  Thought Processes:Coherent ? ?Descriptions of Associations:Intact ? ?Orientation:Full (Time, Place and Person) ? ?Thought Content:Logical ? ?History of Schizophrenia/Schizoaffective disorder:Yes ? ?Duration of Psychotic Symptoms:Greater than six months ? ?Hallucinations:No data recorded ?Ideas of Reference:None ? ?Suicidal Thoughts:No data recorded ?Homicidal Thoughts:No data recorded ? ?Sensorium  ?Memory:Immediate Fair; Recent Fair; Remote Fair ? ?Judgment:Fair ? ?Insight:Fair ? ? ?Executive Functions  ?Concentration:Fair ? ?Attention Span:Fair ? ?Recall:Fair ? ?Fund of  Knowledge:Fair ? ?Language:Fair ? ? ?Psychomotor Activity  ?Psychomotor Activity:No data recorded ? ?Assets  ?Assets:Communication Skills; Desire for Improvement ? ? ?Sleep  ?Sleep:No data recorded ? ? ?Physical Exam: ?Physical Exam ?Vitals and nursing note reviewed.  ?Constitutional:   ?   Appearance: Normal appearance.  ?HENT:  ?   Head: Normocephalic and atraumatic.  ?   Mouth/Throat:  ?   Pharynx: Oropharynx is clear.  ?Eyes:  ?   Pupils: Pupils are equal, round, and reactive to light.  ?Cardiovascular:  ?   Rate and Rhythm: Normal rate and regular rhythm.  ?Pulmonary:  ?   Effort: Pulmonary effort is normal.  ?   Breath sounds: Normal breath sounds.  ?Abdominal:  ?   General: Abdomen is flat.  ?   Palpations: Abdomen is soft.  ?Musculoskeletal:     ?   General: Normal range of motion.  ?Skin: ?   General: Skin is warm and dry.  ?Neurological:  ?   General: No focal deficit present.  ?   Mental Status: He is alert. Mental status is at baseline.  ?Psychiatric:     ?   Mood and Affect: Mood normal.     ?   Thought Content: Thought content normal.  ? ?Review of Systems  ?Constitutional: Negative.   ?HENT: Negative.    ?Eyes: Negative.   ?Respiratory: Negative.    ?Cardiovascular: Negative.   ?Gastrointestinal: Negative.   ?Musculoskeletal: Negative.   ?Skin: Negative.   ?Neurological: Negative.   ?Psychiatric/Behavioral: Negative.    ?Blood pressure (!) 122/96, pulse 69, temperature 98 ?F (36.7 ?C), temperature source Oral, resp. rate 17, height 5\' 11"  (1.803 m), weight 83.9 kg, SpO2 94 %. Body mass index is 25.8 kg/m?. ? ? ?Social History  ? ?Tobacco Use  ?Smoking Status Every Day  ? Packs/day: 0.50  ? Years: 14.00  ? Pack years: 7.00  ? Types: Cigarettes  ?Smokeless Tobacco Former  ? Types: Chew  ?Tobacco Comments  ? Pt refused counseling  ? ?Tobacco Cessation:  A prescription for an FDA-approved tobacco cessation medication was offered at discharge and the patient refused ? ? ?Blood Alcohol level:  ?Lab Results   ?Component Value Date  ? ETH <10 11/24/2021  ? ETH <10 11/22/2021  ? ? ?Metabolic Disorder Labs:  ?Lab Results  ?Component Value Date  ? HGBA1C 5.4 11/26/2021  ? MPG 108 11/26/2021  ? MPG 108.28 09/04/2021  ? ?No results found for: PROLACTIN ?Lab Results  ?Component Value Date  ? CHOL 132 11/26/2021  ? TRIG 199 (H) 11/26/2021  ? HDL 31 (L) 11/26/2021  ? CHOLHDL 4.3 11/26/2021  ? VLDL 40 11/26/2021  ? LDLCALC 61 11/26/2021  ? LDLCALC 53 09/04/2021  ? ? ?See Psychiatric Specialty Exam and Suicide Risk Assessment completed by Attending Physician prior to discharge. ? ?Discharge destination:  Home ? ?Is patient on multiple antipsychotic therapies at discharge:  No   ?Has Patient had three or more failed trials of antipsychotic monotherapy by history:  No ? ?Recommended Plan for Multiple Antipsychotic Therapies: ?NA ? ?Discharge Instructions   ? ?  Diet - low sodium heart healthy   Complete by: As directed ?  ? Increase activity slowly   Complete by: As directed ?  ? ?  ? ?Allergies as of 11/28/2021   ?No Known Allergies ?  ? ?  ?Medication List  ?  ? ?STOP taking these medications   ? ?benztropine 1 MG tablet ?Commonly known as: COGENTIN ?  ?FLUoxetine 20 MG capsule ?Commonly known as: PROZAC ?  ?haloperidol 5 MG tablet ?Commonly known as: HALDOL ?  ?hydrOXYzine 25 MG tablet ?Commonly known as: ATARAX ?  ?mirtazapine 15 MG tablet ?Commonly known as: REMERON ?  ?traZODone 50 MG tablet ?Commonly known as: DESYREL ?  ? ?  ? ?TAKE these medications   ? ?  Indication  ?clonazePAM 0.5 MG tablet ?Commonly known as: KLONOPIN ?Take 1 tablet (0.5 mg total) by mouth 2 (two) times daily as needed (panic attack). ? Indication: Feeling Anxious, Panic Disorder ?  ?gabapentin 100 MG capsule ?Commonly known as: NEURONTIN ?Take 1 capsule (100 mg total) by mouth 3 (three) times daily. ? Indication: Social Anxiety Disorder ?  ?PARoxetine 20 MG tablet ?Commonly known as: PAXIL ?Take 1 tablet (20 mg total) by mouth daily. ?What changed:   ?medication strength ?how much to take ? Indication: Panic Disorder ?  ?QUEtiapine 100 MG tablet ?Commonly known as: SEROQUEL ?Take 1 tablet (100 mg total) by mouth at bedtime. ?What changed: when to take this ? Indication: Generalized Anxiety

## 2021-11-28 NOTE — Progress Notes (Signed)
Patient is pleasant and cooperative.  He is med compliant and received his QHS meds without incident. He reports feeling better and being ready for discharge. He denies si hi avh depression and anxiety at this encounter.  Encouraged him to seek staff with any concerns.Will continue to monitor with q 15 minute safety plans. ? ? ? ? ? ?C. Butler-Nicholson, LPN ?

## 2021-11-28 NOTE — Progress Notes (Signed)
D: Pt denies SI/HI/AVH, anxiety pain and depression.  ? ?A: Pt provided support and encouragement. Pt given medication per protocol and standing orders. Q60m safety checks implemented and continued.  ? ?R: Pt safe on the unit. Will continue to monitor.  ?

## 2023-02-17 ENCOUNTER — Other Ambulatory Visit: Payer: Self-pay

## 2023-06-26 IMAGING — CR DG CHEST 2V
1 series · 2 of 2 positions shown · non-contrast
Comparison: Chest radiographs 12/27/2019.

CLINICAL DATA: 35-year-old male with shortness of breath.  Smoker.

EXAM:
CHEST - 2 VIEW

[Series 1: w chest pa · 0.14mm/px · 2 of 2 slices shown]
[im 1/2]
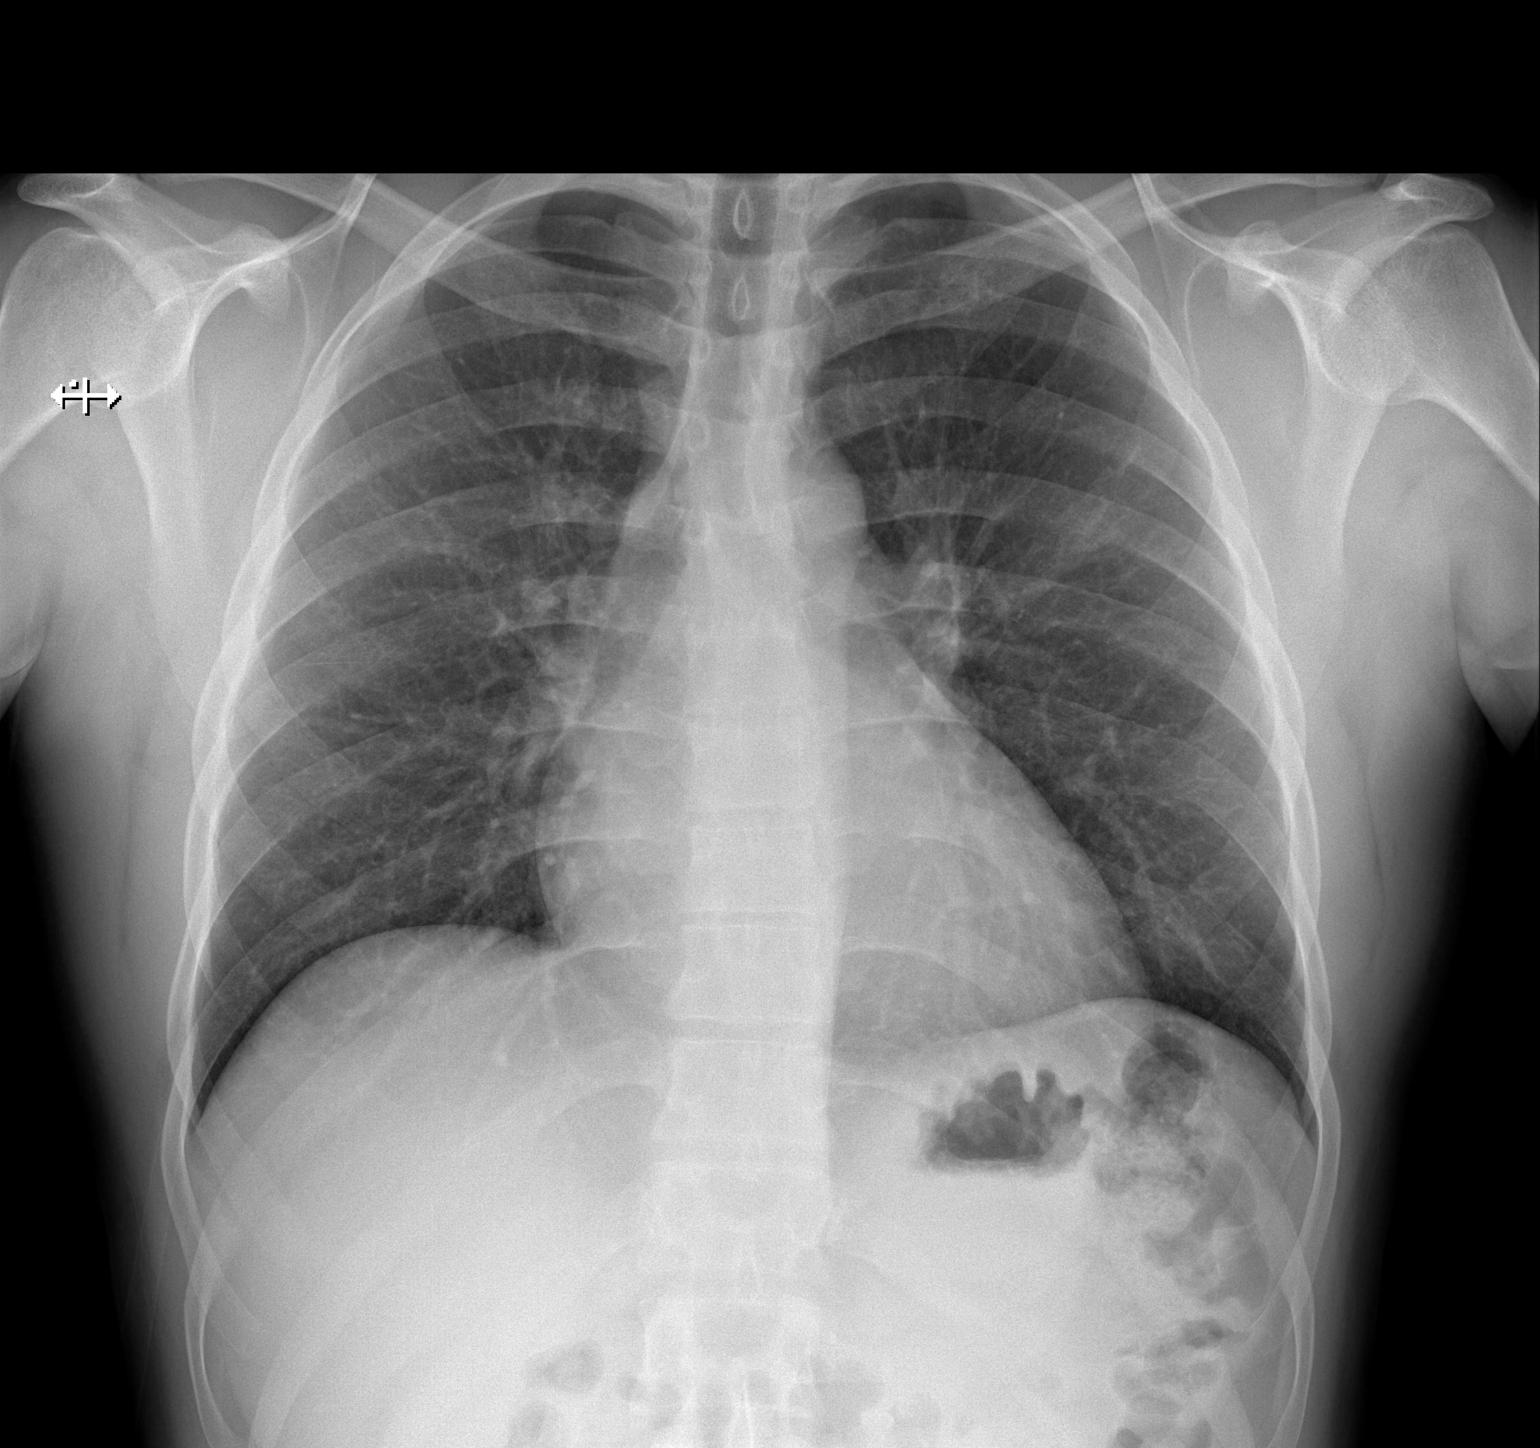
[im 2/2]
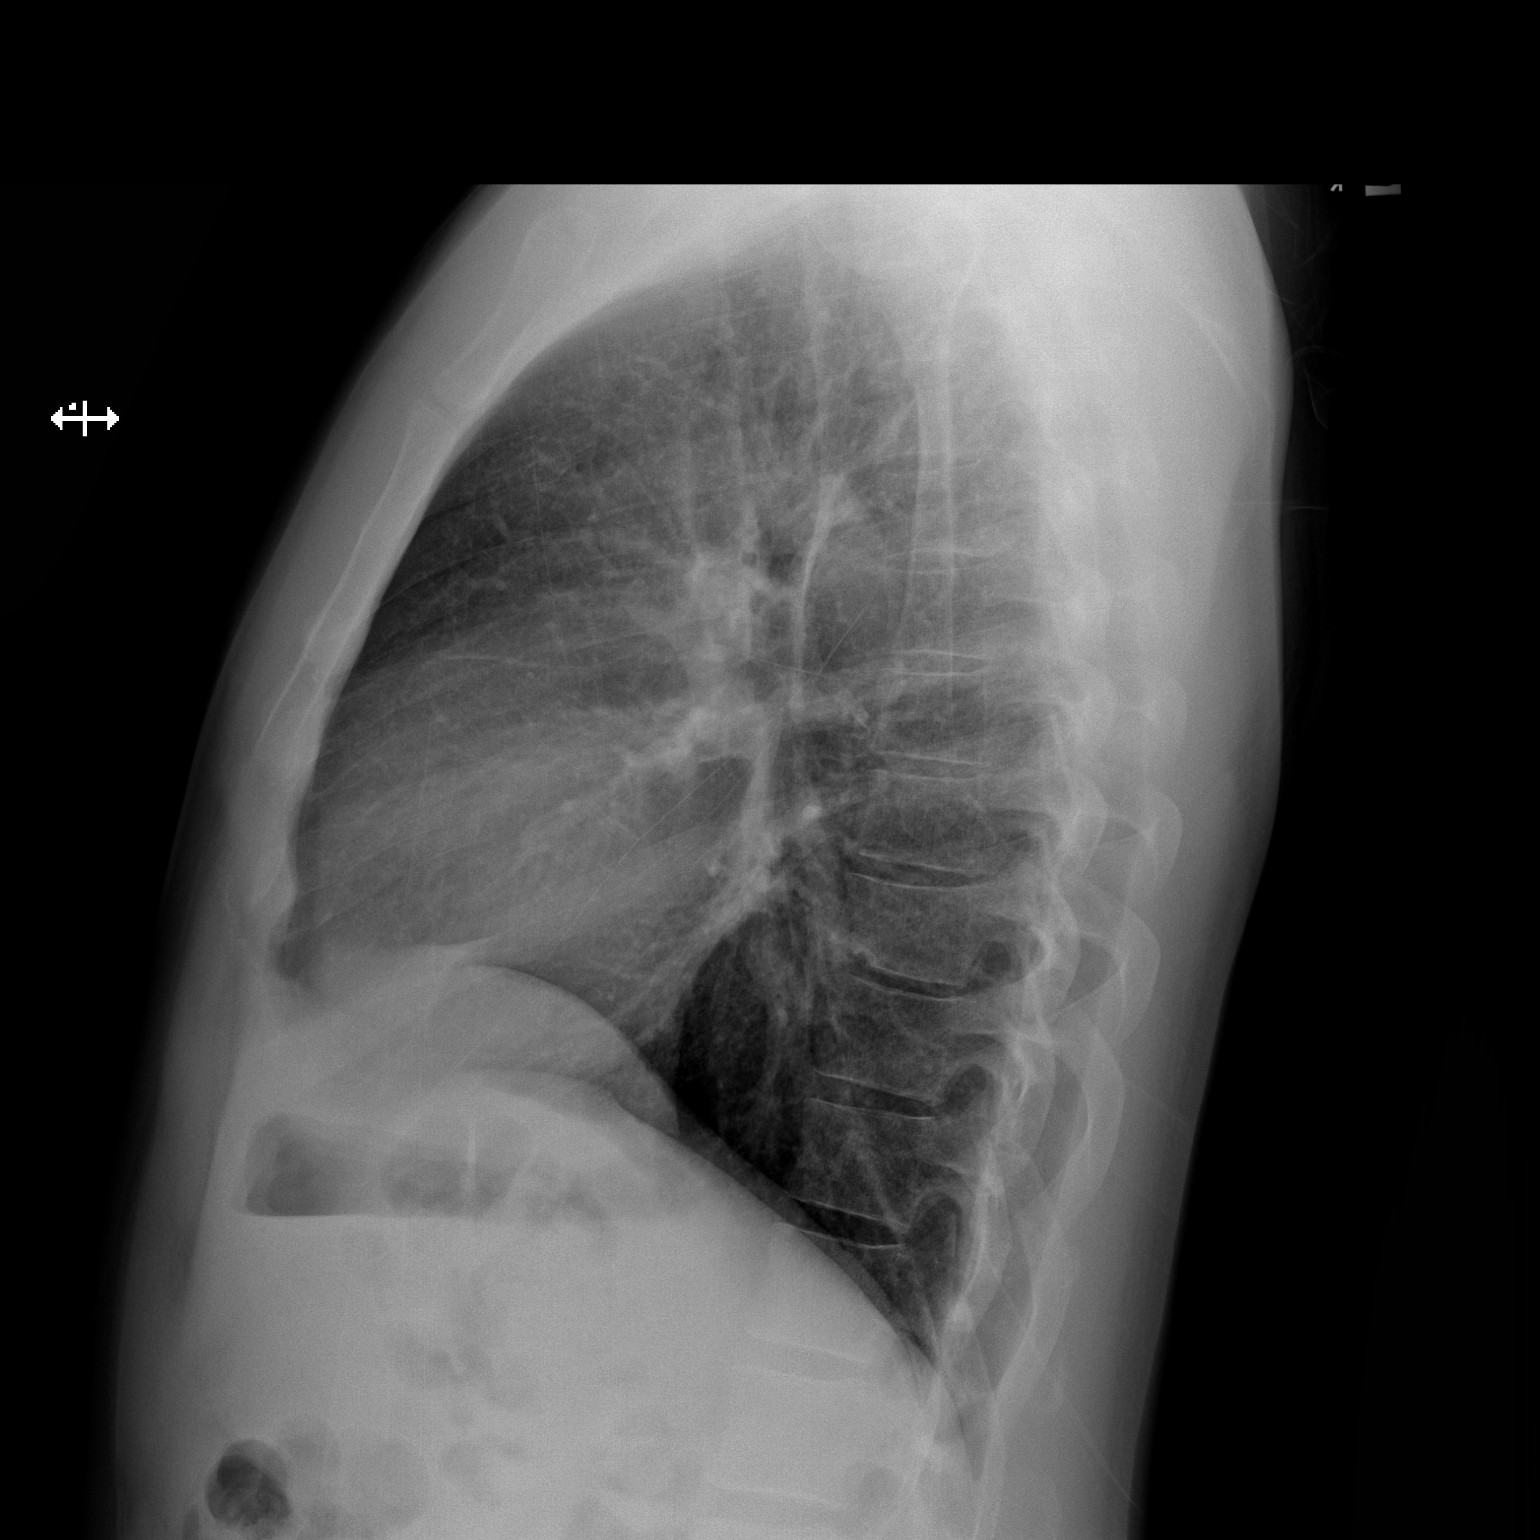

[2 of 2 positions shown; findings below may reference images not displayed]

FINDINGS: Mildly lower lung volumes. Normal cardiac size and mediastinal
contours. Visualized tracheal air column is within normal limits.
Bilateral lung markings are stable from last year, with some chronic
increase in the interstitium which is likely smoking related. No
pneumothorax, pulmonary edema, pleural effusion or confluent
pulmonary opacity.

No acute osseous abnormality identified. Negative visible bowel gas
pattern.
IMPRESSION: No acute cardiopulmonary abnormality.

## 2023-06-26 IMAGING — US US SCROTUM W/ DOPPLER COMPLETE
1 series · 14 of 25 positions shown · non-contrast
Comparison: None.

CLINICAL DATA: 35-year-old male with testicular pain onset last
night.

EXAM:
SCROTAL ULTRASOUND
DOPPLER ULTRASOUND OF THE TESTICLES
TECHNIQUE: Complete ultrasound examination of the testicles, epididymis, and
other scrotal structures was performed. Color and spectral Doppler
ultrasound were also utilized to evaluate blood flow to the
testicles.

[Series 1: us scrotum w/doppler · 14 of 56 slices shown]
[im 1/56]
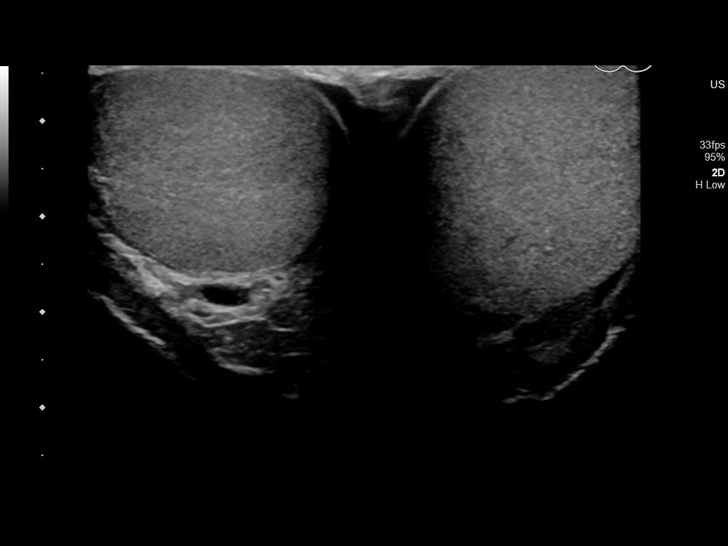
[im 5/56]
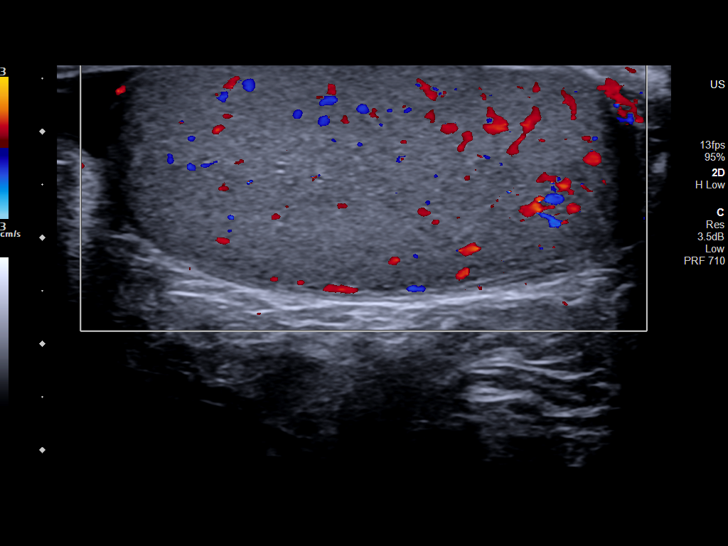
[im 10/56]
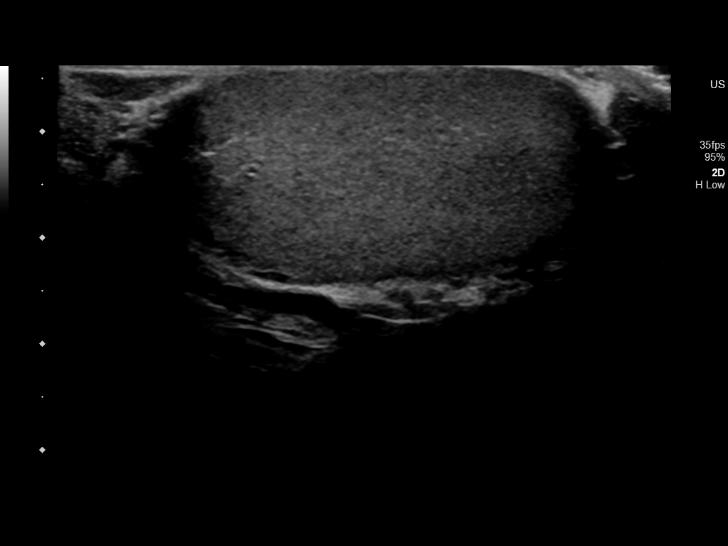
[im 14/56]
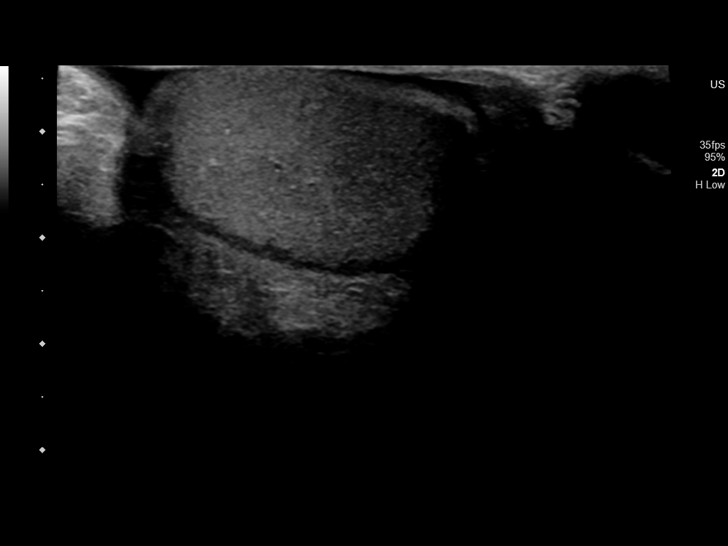
[im 19/56]
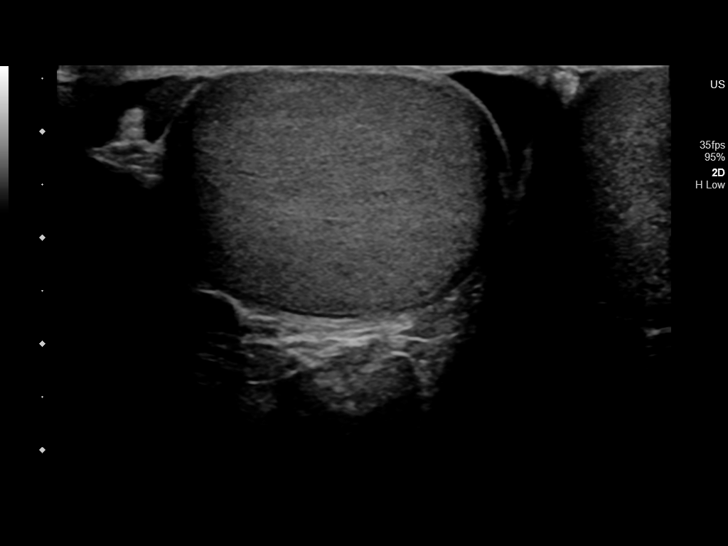
[im 21/56]
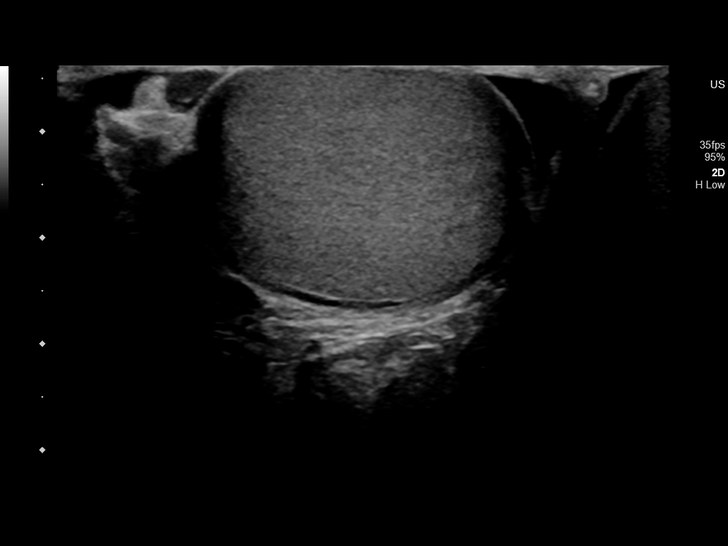
[im 26/56]
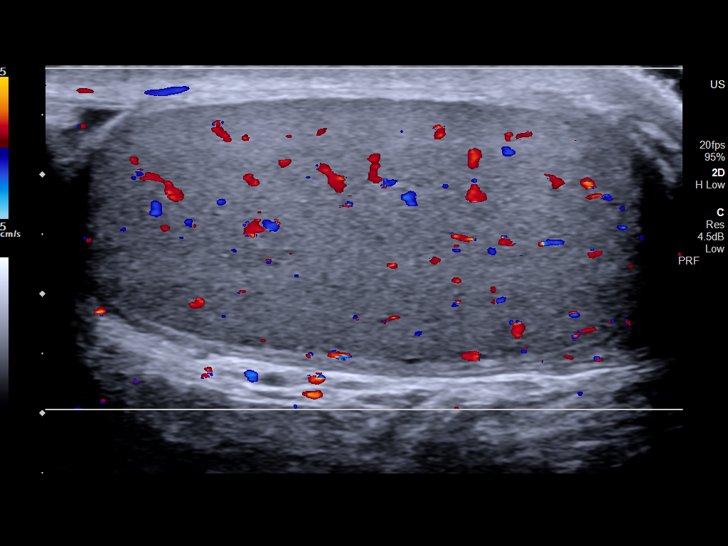
[im 30/56]
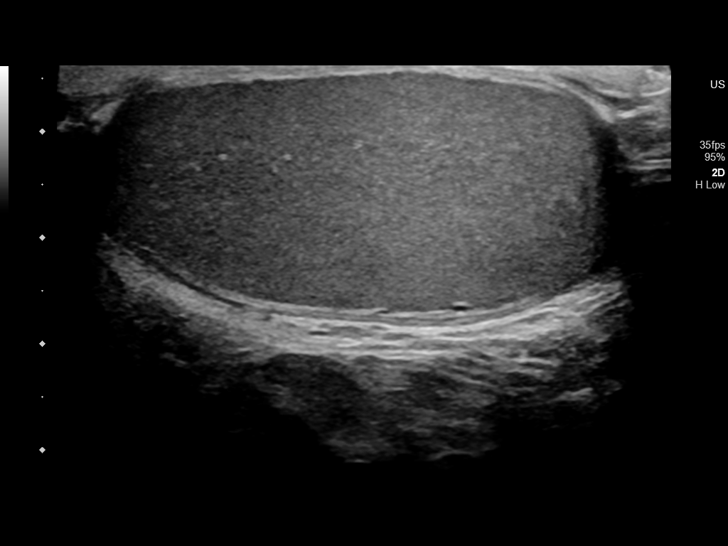
[im 35/56]
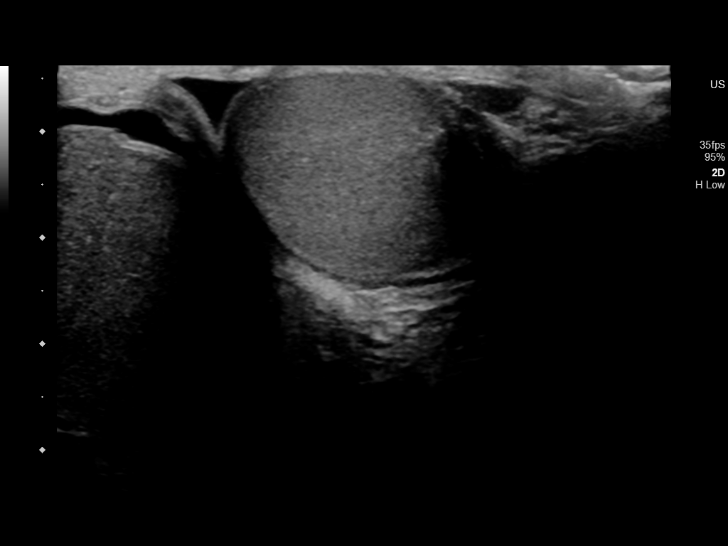
[im 37/56]
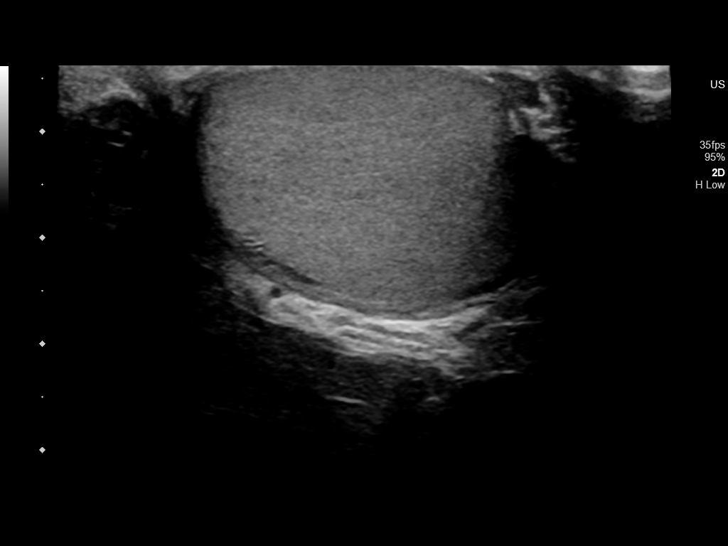
[im 42/56]
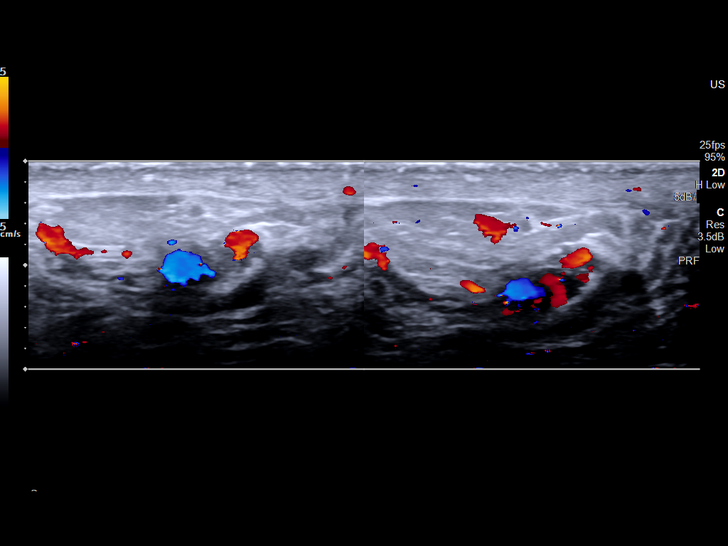
[im 46/56]
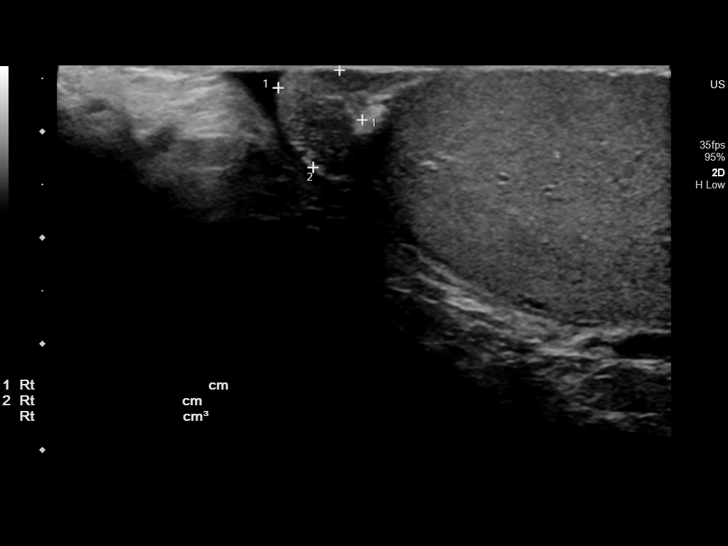
[im 51/56]
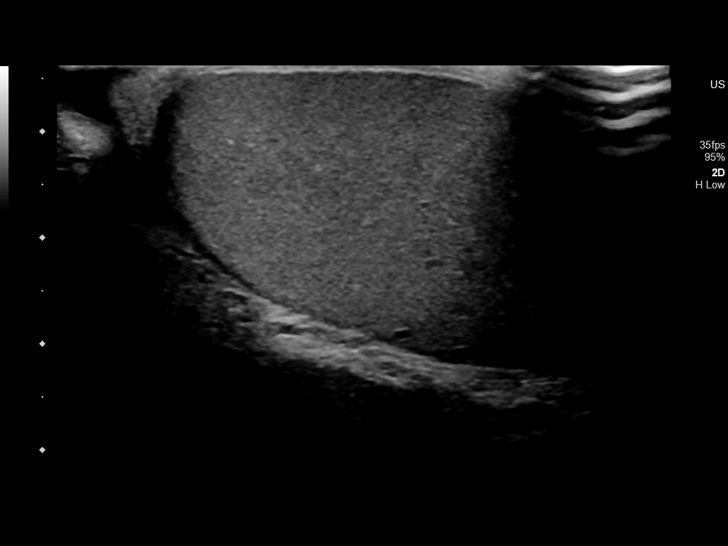
[im 56/56]
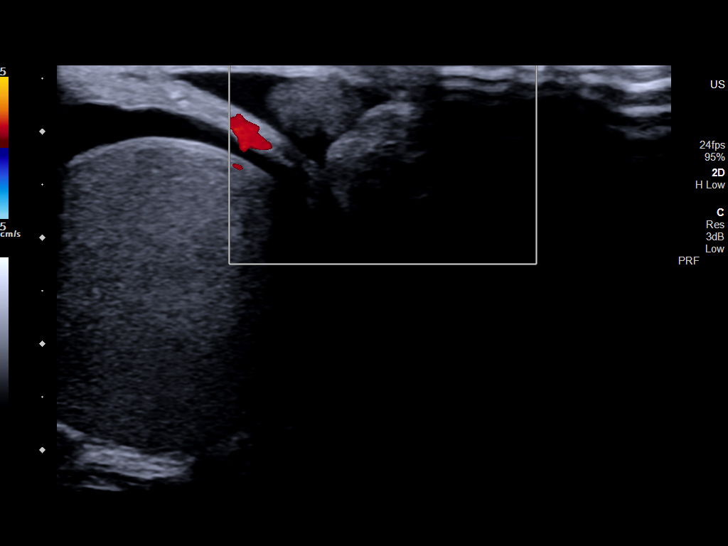

[14 of 25 positions shown; findings below may reference images not displayed]

FINDINGS: Right testicle

Measurements: 4.7 x 2.2 x 2.7 cm. No mass or microlithiasis
visualized.

Left testicle

Measurements: 4.8 x 2.1 x 3.0 cm. No mass or microlithiasis
visualized.

Right epididymis:  Normal in size and appearance.

Left epididymis:  Normal in size and appearance.

Hydrocele:  Trace bilateral, significance doubtful.

Varicocele:  None visualized.

Pulsed Doppler interrogation of both testes demonstrates normal low
resistance arterial and venous waveforms bilaterally.
IMPRESSION: Normal scrotal ultrasound with Doppler. No evidence of testicular
torsion.

## 2023-07-21 IMAGING — CR DG CHEST 2V
2 series · 2 of 2 positions shown · non-contrast
Comparison: 07/25/2021

CLINICAL DATA: Shortness of breath today.

EXAM:
CHEST - 2 VIEW

[chest pa]
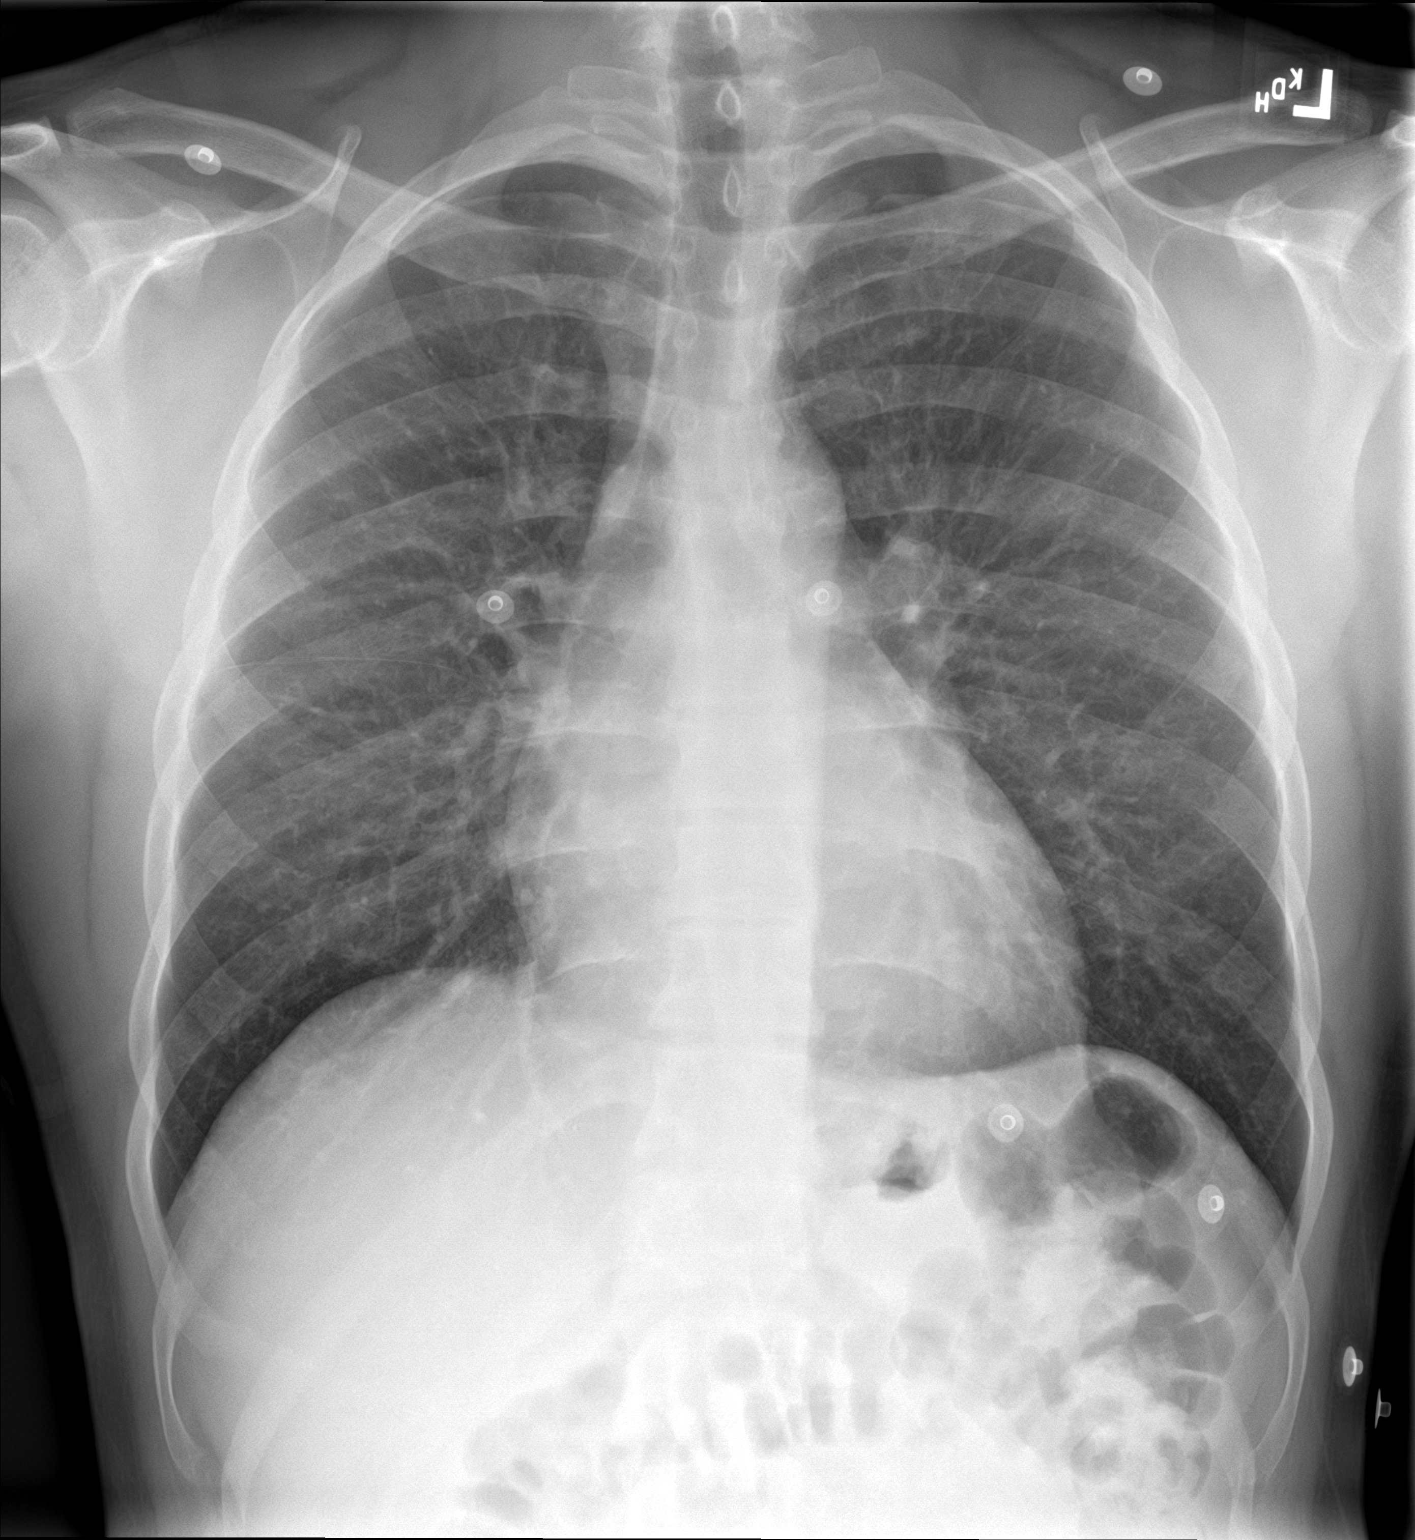

[chest lat]
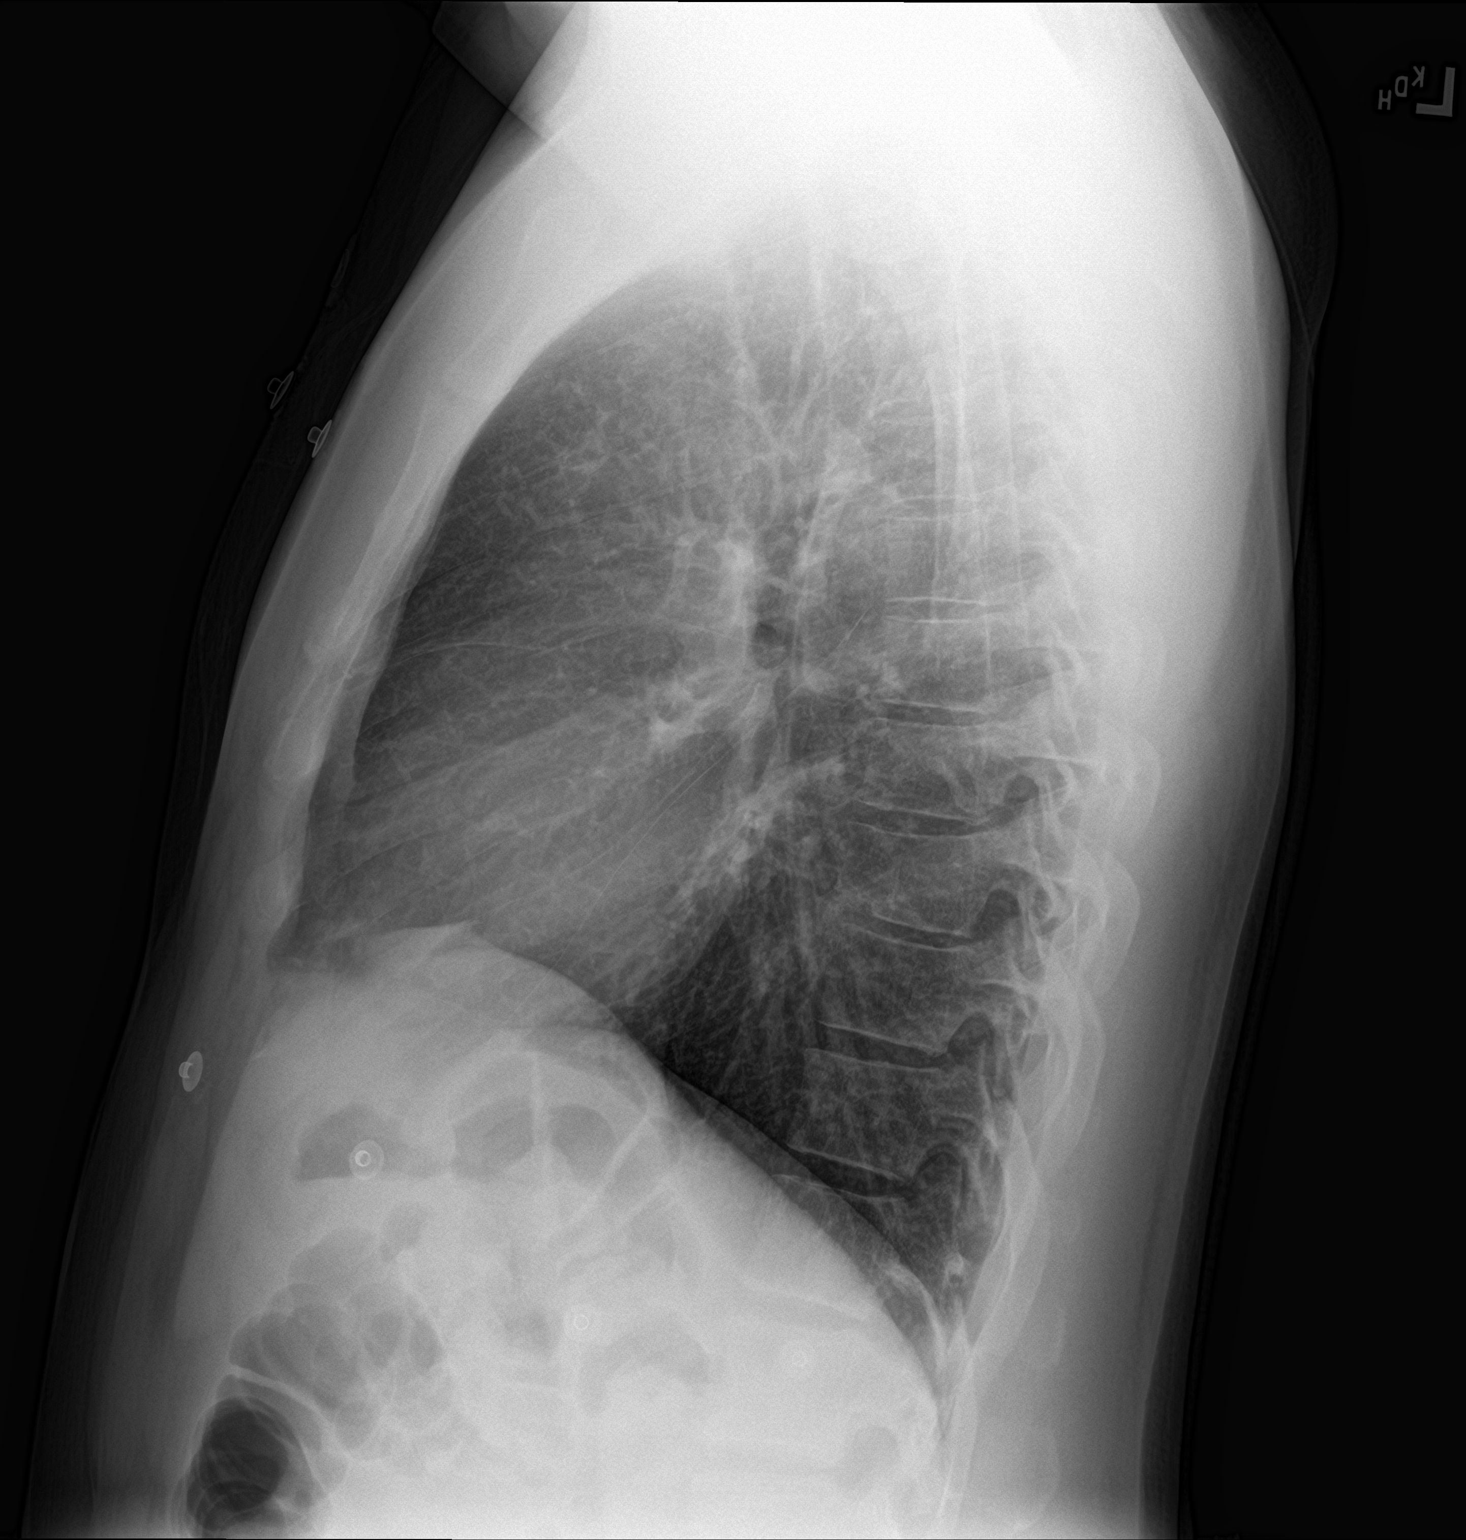

[2 of 2 positions shown; findings below may reference images not displayed]

FINDINGS: The heart size and mediastinal contours are within normal limits.
Both lungs are clear. The visualized skeletal structures are
unremarkable.
IMPRESSION: No active cardiopulmonary disease.

## 2024-11-09 ENCOUNTER — Ambulatory Visit: Payer: Self-pay | Admitting: Nurse Practitioner
